# Patient Record
Sex: Male | Born: 1960
Health system: Southern US, Community
[De-identification: ages and names within clinical notes are randomized; demographics above are authoritative.]

## PROBLEM LIST (undated history)

## (undated) DIAGNOSIS — K219 Gastro-esophageal reflux disease without esophagitis: Secondary | ICD-10-CM

## (undated) DIAGNOSIS — Z8619 Personal history of other infectious and parasitic diseases: Secondary | ICD-10-CM

## (undated) DIAGNOSIS — Z860100 Personal history of colon polyps, unspecified: Secondary | ICD-10-CM

## (undated) DIAGNOSIS — E785 Hyperlipidemia, unspecified: Secondary | ICD-10-CM

## (undated) DIAGNOSIS — F329 Major depressive disorder, single episode, unspecified: Secondary | ICD-10-CM

## (undated) DIAGNOSIS — I10 Essential (primary) hypertension: Secondary | ICD-10-CM

## (undated) DIAGNOSIS — Z8489 Family history of other specified conditions: Secondary | ICD-10-CM

## (undated) DIAGNOSIS — F32A Depression, unspecified: Secondary | ICD-10-CM

## (undated) DIAGNOSIS — I209 Angina pectoris, unspecified: Secondary | ICD-10-CM

## (undated) DIAGNOSIS — I2510 Atherosclerotic heart disease of native coronary artery without angina pectoris: Secondary | ICD-10-CM

## (undated) HISTORY — DX: Personal history of colonic polyps: Z86.010

## (undated) HISTORY — PX: REFRACTIVE SURGERY: SHX103

## (undated) HISTORY — DX: Hyperlipidemia, unspecified: E78.5

## (undated) HISTORY — PX: WISDOM TOOTH EXTRACTION: SHX21

## (undated) HISTORY — DX: Personal history of other infectious and parasitic diseases: Z86.19

## (undated) HISTORY — DX: Personal history of colon polyps, unspecified: Z86.0100

## (undated) HISTORY — DX: Essential (primary) hypertension: I10

---

## 2013-09-19 HISTORY — PX: ROTATOR CUFF REPAIR: SHX139

## 2014-09-19 LAB — HM COLONOSCOPY

## 2016-07-12 ENCOUNTER — Encounter: Payer: Self-pay | Admitting: *Deleted

## 2016-07-12 ENCOUNTER — Telehealth: Payer: Self-pay | Admitting: *Deleted

## 2016-07-12 NOTE — Telephone Encounter (Signed)
Pre-Visit Call completed with patient and chart updated.   Pre-Visit Info documented in Specialty Comments under SnapShot.    

## 2016-07-13 ENCOUNTER — Telehealth: Payer: Self-pay | Admitting: Family Medicine

## 2016-07-13 ENCOUNTER — Encounter: Payer: Self-pay | Admitting: Family Medicine

## 2016-07-13 ENCOUNTER — Ambulatory Visit (INDEPENDENT_AMBULATORY_CARE_PROVIDER_SITE_OTHER): Payer: 59 | Admitting: Family Medicine

## 2016-07-13 VITALS — BP 106/52 | HR 74 | Temp 98.4°F | Ht 67.0 in | Wt 184.2 lb

## 2016-07-13 DIAGNOSIS — F32A Depression, unspecified: Secondary | ICD-10-CM | POA: Insufficient documentation

## 2016-07-13 DIAGNOSIS — K219 Gastro-esophageal reflux disease without esophagitis: Secondary | ICD-10-CM

## 2016-07-13 DIAGNOSIS — I10 Essential (primary) hypertension: Secondary | ICD-10-CM | POA: Diagnosis not present

## 2016-07-13 DIAGNOSIS — M79642 Pain in left hand: Secondary | ICD-10-CM | POA: Diagnosis not present

## 2016-07-13 DIAGNOSIS — Z13220 Encounter for screening for lipoid disorders: Secondary | ICD-10-CM | POA: Diagnosis not present

## 2016-07-13 DIAGNOSIS — F324 Major depressive disorder, single episode, in partial remission: Secondary | ICD-10-CM | POA: Diagnosis not present

## 2016-07-13 DIAGNOSIS — R195 Other fecal abnormalities: Secondary | ICD-10-CM

## 2016-07-13 DIAGNOSIS — F329 Major depressive disorder, single episode, unspecified: Secondary | ICD-10-CM | POA: Insufficient documentation

## 2016-07-13 DIAGNOSIS — M79641 Pain in right hand: Secondary | ICD-10-CM | POA: Diagnosis not present

## 2016-07-13 HISTORY — DX: Essential (primary) hypertension: I10

## 2016-07-13 LAB — C-REACTIVE PROTEIN: CRP: 0.2 mg/dL — AB (ref 0.5–20.0)

## 2016-07-13 LAB — COMPREHENSIVE METABOLIC PANEL
ALBUMIN: 4.4 g/dL (ref 3.5–5.2)
ALK PHOS: 65 U/L (ref 39–117)
ALT: 44 U/L (ref 0–53)
AST: 29 U/L (ref 0–37)
BUN: 15 mg/dL (ref 6–23)
CALCIUM: 10.1 mg/dL (ref 8.4–10.5)
CO2: 28 mEq/L (ref 19–32)
CREATININE: 1.03 mg/dL (ref 0.40–1.50)
Chloride: 103 mEq/L (ref 96–112)
GFR: 80.21 mL/min (ref >60.00)
Glucose, Bld: 101 mg/dL — ABNORMAL HIGH (ref 70–99)
POTASSIUM: 4.3 meq/L (ref 3.5–5.1)
SODIUM: 138 meq/L (ref 135–145)
TOTAL PROTEIN: 6.9 g/dL (ref 6.0–8.3)
Total Bilirubin: 0.5 mg/dL (ref 0.2–1.2)

## 2016-07-13 LAB — SEDIMENTATION RATE: SED RATE: 2 mm/h (ref 0–20)

## 2016-07-13 LAB — MICROALBUMIN / CREATININE URINE RATIO
CREATININE, U: 56.2 mg/dL
MICROALB/CREAT RATIO: 0.2 mg/g (ref 0.0–30.0)
Microalb, Ur: 0.1 mg/dL (ref 0.0–1.9)

## 2016-07-13 LAB — LIPID PANEL
CHOLESTEROL: 271 mg/dL — AB (ref 0–200)
HDL: 44.2 mg/dL (ref >39.00)
NonHDL: 226.9
Total CHOL/HDL Ratio: 6
Triglycerides: 352 mg/dL — ABNORMAL HIGH (ref 0.0–149.0)
VLDL: 70.4 mg/dL — AB (ref 0.0–40.0)

## 2016-07-13 LAB — LDL CHOLESTEROL, DIRECT: Direct LDL: 198 mg/dL

## 2016-07-13 MED ORDER — VORTIOXETINE HBR 20 MG PO TABS: 20.0000 mg | ORAL_TABLET | Freq: Every day | ORAL | 1 refills | Status: DC

## 2016-07-13 MED ORDER — LISINOPRIL 20 MG PO TABS: 20.0000 mg | ORAL_TABLET | Freq: Every day | ORAL | 1 refills | Status: DC

## 2016-07-13 NOTE — Progress Notes (Signed)
Chief Complaint  Patient presents with  . Establish Care    pt requesting medication refills,dicuss joint pain in fingers,more heartburn and reflux,freq.diarrhea(chronic)       New Patient Visit SUBJECTIVE: HPI: Jordan Hendricks is an 55 y.o.male who is being seen for establishing care.  The patient was previously seen at [facility]. New to the area.   2011-polyps recheck in 2016 without polyps; to go back in 2021  Joint pain in hands B/l hands, intermittent, 6 mo. Getting more frequent. In knuckles and (points to PIP joints) b/l. Has not had any new numbness, tingling or weakness. No swelling or redness that he notices. No rashes.  GERD Fond of foods that cause him issues- tomato sauces, spicy foods, [place] food. Burning sensation in upper abd region and chest. Tums to calm it down.   Diarrhea 3 times in the AM, will eat and right after will have a BM. Will be nml or loose. Going about 4-5 times daily, has been going on for several years. No incontinence. No pain, weight loss, or bleeding.  Allergies  Allergen Reactions  . Lactose Intolerance (Gi)    Past Medical History:  Diagnosis Date  . Essential hypertension 07/13/2016  . History of chicken pox   . History of colon polyps   . Hyperlipidemia   . Hypertension    Past Surgical History:  Procedure Laterality Date  . REFRACTIVE SURGERY Bilateral   . ROTATOR CUFF REPAIR Right 2015   Social History   Social History  . Marital status: Married   Social History Main Topics  . Smoking status: Never Smoker  . Smokeless tobacco: Never Used  . Alcohol use Yes  . Drug use: No   Family History  Problem Relation Age of Onset  . Heart disease Father   . Prostate cancer Father   . Alzheimer's disease Father   . Diabetes Father   . Hypertension Father   . Diabetes Maternal Grandmother   . Diabetes Paternal Grandmother     Current Outpatient Prescriptions:  .  Cholecalciferol (VITAMIN D3) 2000 units  TABS, Take 1 tablet by mouth daily., Disp: , Rfl:  .  lisinopril (PRINIVIL,ZESTRIL) 20 MG tablet, Take 1 tablet (20 mg total) by mouth daily., Disp: 90 tablet, Rfl: 1 .  Multiple Vitamins-Minerals (MULTIVITAMIN ADULT PO), Take 1 tablet by mouth daily., Disp: , Rfl:  .  vortioxetine HBr (TRINTELLIX) 20 MG TABS, Take 20 mg by mouth daily., Disp: 90 tablet, Rfl: 1  ROS Cardiovascular: Denies chest pain  Respiratory: Denies dyspnea   OBJECTIVE: BP (!) 106/52 (BP Location: Left Arm, Patient Position: Sitting, Cuff Size: Normal)   Pulse 74   Temp 98.4 F (36.9 C) (Oral)   Ht 5' 7"  (1.702 m)   Wt 184 lb 3.2 oz (83.6 kg)   SpO2 97%   BMI 28.85 kg/m    Constitutional: -  VS reviewed -  Well developed, well nourished, appears stated age -  No apparent distress  Psychiatric: -  Oriented to person, place, and time -  Memory intact -  Affect and mood normal -  Fluent conversation, good eye contact -  Judgment and insight age appropriate  Eye: -  Conjunctivae clear, no discharge -  Pupils symmetric, round, reactive to light  ENMT: -  Oral mucosa without lesions, tongue and uvula midline    Tonsils not enlarged, no erythema, no exudate, trachea midline    Pharynx moist, no lesions, no erythema  Neck: -  No gross  swelling, no palpable masses -  Thyroid midline, not enlarged, mobile, no palpable masses  Cardiovascular: -  RRR, no murmurs -  No LE edema  Respiratory: -  Normal respiratory effort, no accessory muscle use, no retraction -  Breath sounds equal, no wheezes, no ronchi, no crackles  Gastrointestinal: -  Bowel sounds normal -  No tenderness, no distention, no guarding, no masses  Musculoskeletal: -  No tenderness to palpation over the MCP or PIP joints bilaterally, there is no swelling or erythema noted either -  Gait normal -  Grip strength is adequate bilaterally   Skin: -  No significant lesion on inspection -  Warm and dry to palpation   ASSESSMENT/PLAN: Pain in both hands  - Plan: Sed Rate (ESR), C-reactive protein, ANA,IFA RA Diag Pnl w/rflx Tit/Patn  Essential hypertension - Plan: Microalbumin / creatinine urine ratio, Comprehensive metabolic panel, lisinopril (PRINIVIL,ZESTRIL) 20 MG tablet  Screening cholesterol level - Plan: Lipid panel  Major depressive disorder with single episode, in partial remission ([place]) - Plan: vortioxetine HBr (TRINTELLIX) 20 MG TABS  Gastroesophageal reflux disease, esophagitis presence not specified  Loose stools  Patient instructed to sign release of records form from his previous PCP. Rule out autoimmune etiology given age and distribution. Discussed avoiding triggers for reflux. Discussed elevating the head of the bed and weight loss are the only evidence based changes that will help symptoms. Everything else is anecdotal. Recommended Prilosec 20 mg twice daily for the next 2 months to see if he gets any relief. As his loose stool issue is a chronic situation and he has had a colonoscopy within the timeframe of loose stools, will recommend bulking agent. If no improvement with this, will refer to gastroenterology. Patient should return in 6 weeks to review the above. The patient voiced understanding and agreement to the plan.   [place], DO 07/13/16  8:40 AM

## 2016-07-13 NOTE — Patient Instructions (Signed)
Metamucil daily will help bulk up your stools.  Prilosec (omeprazole) 20 mg twice daily for the next 2 mo.

## 2016-07-13 NOTE — Progress Notes (Signed)
Pre visit review using our clinic review tool, if applicable. No additional management support is needed unless otherwise documented below in the visit note. 

## 2016-07-13 NOTE — Telephone Encounter (Signed)
[facility] - Dr. Jonathon BellowsIan Foxall

## 2016-07-14 LAB — ANA,IFA RA DIAG PNL W/RFLX TIT/PATN: ANA: NEGATIVE

## 2016-08-24 ENCOUNTER — Encounter: Payer: Self-pay | Admitting: Family Medicine

## 2016-08-24 ENCOUNTER — Ambulatory Visit (INDEPENDENT_AMBULATORY_CARE_PROVIDER_SITE_OTHER): Payer: 59 | Admitting: Family Medicine

## 2016-08-24 VITALS — BP 128/84 | HR 85 | Temp 98.7°F | Ht 67.0 in | Wt 182.0 lb

## 2016-08-24 DIAGNOSIS — R0609 Other forms of dyspnea: Secondary | ICD-10-CM | POA: Diagnosis not present

## 2016-08-24 DIAGNOSIS — R0789 Other chest pain: Secondary | ICD-10-CM

## 2016-08-24 DIAGNOSIS — Z8639 Personal history of other endocrine, nutritional and metabolic disease: Secondary | ICD-10-CM | POA: Diagnosis not present

## 2016-08-24 DIAGNOSIS — K219 Gastro-esophageal reflux disease without esophagitis: Secondary | ICD-10-CM | POA: Diagnosis not present

## 2016-08-24 LAB — COMPREHENSIVE METABOLIC PANEL
ALK PHOS: 64 U/L (ref 39–117)
ALT: 32 U/L (ref 0–53)
AST: 22 U/L (ref 0–37)
Albumin: 4.4 g/dL (ref 3.5–5.2)
BILIRUBIN TOTAL: 0.6 mg/dL (ref 0.2–1.2)
BUN: 15 mg/dL (ref 6–23)
CALCIUM: 9.8 mg/dL (ref 8.4–10.5)
CO2: 27 meq/L (ref 19–32)
Chloride: 103 mEq/L (ref 96–112)
Creatinine, Ser: 1 mg/dL (ref 0.40–1.50)
GFR: 82.96 mL/min (ref >60.00)
GLUCOSE: 109 mg/dL — AB (ref 70–99)
POTASSIUM: 4.1 meq/L (ref 3.5–5.1)
Sodium: 138 mEq/L (ref 135–145)
TOTAL PROTEIN: 7 g/dL (ref 6.0–8.3)

## 2016-08-24 LAB — CBC
HEMATOCRIT: 44.1 % (ref 39.0–52.0)
HEMOGLOBIN: 15.3 g/dL (ref 13.0–17.0)
MCHC: 34.6 g/dL (ref 30.0–36.0)
MCV: 86 fl (ref 78.0–100.0)
PLATELETS: 325 10*3/uL (ref 150.0–400.0)
RBC: 5.13 Mil/uL (ref 4.22–5.81)
RDW: 12.6 % (ref 11.5–15.5)
WBC: 6.1 10*3/uL (ref 4.0–10.5)

## 2016-08-24 LAB — VITAMIN D 25 HYDROXY (VIT D DEFICIENCY, FRACTURES): VITD: 34.3 ng/mL (ref 30.00–100.00)

## 2016-08-24 NOTE — Patient Instructions (Addendum)
Pepcid (famotidine) 20 mg twice daily or Zantac (ranitidine) 150 mg twice daily.

## 2016-08-24 NOTE — Progress Notes (Signed)
Chief Complaint  Patient presents with  . Follow-up    GERD/Pt reports still having reflux     GERD Jordan Hendricks is a 55 y.o. male who presents for evaluation of GERD. He denies nocturnal burning, weight loss Aggravating factors and specific triggers include certain foods. Alleviating factors include proton pump inhibitors and Tums Was recommended to take omeprazole OTC and this did help. He stopped, as he was following the manufacturers instructions.  Chest tightness/SOB Pt has also been noticing DOE, some central chest tightness and mandibular dental pain. This has been going on for around 6 mo. He is largely sedentary, though this was a drastic change. He has a hx of hyperlipidemia and hypertension. He also has a significant family hx of heart disease through his father. He does not smoke. He is compliant with his medications.   Past Medical History:  Diagnosis Date  . Essential hypertension 07/13/2016  . History of chicken pox   . History of colon polyps   . Hyperlipidemia    Medications Current Outpatient Prescriptions on File Prior to Visit  Medication Sig Dispense Refill  . Cholecalciferol (VITAMIN D3) 2000 units TABS Take 1 tablet by mouth daily.    Marland Kitchen. lisinopril (PRINIVIL,ZESTRIL) 20 MG tablet Take 1 tablet (20 mg total) by mouth daily. 90 tablet 1  . Multiple Vitamins-Minerals (MULTIVITAMIN ADULT PO) Take 1 tablet by mouth daily.    Marland Kitchen. vortioxetine HBr (TRINTELLIX) 20 MG TABS Take 20 mg by mouth daily. 90 tablet 1   Allergies Allergies  Allergen Reactions  . Lactose Intolerance (Gi)    Family History Family History  Problem Relation Age of Onset  . Heart disease Father   . Prostate cancer Father   . Alzheimer's disease Father   . Diabetes Father   . Hypertension Father   . Diabetes Maternal Grandmother   . Diabetes Paternal Grandmother     Review of Systems Cardiovascular: no current chest pain Respiratory:  +DOE, no current SOB Gastrointestinal: as  noted in HPI  Exam BP 128/84 (BP Location: Left Arm, Patient Position: Sitting, Cuff Size: Large)   Pulse 85   Temp 98.7 F (37.1 C) (Oral)   Ht 5\' 7"  (1.702 m)   Wt 182 lb (82.6 kg)   SpO2 97%   BMI 28.51 kg/m  General:  well developed, well nourished, in no apparent distress Skin:  warm, no pallor or diaphoresis Thorax:  nontender Lungs:  clear to auscultation, breath sounds equal bilaterally, no respiratory distress, no wheezes Cardio:  regular rate and rhythm without murmurs, heart sounds without clicks or rubs Abdomen:  abdomen soft, nontender; bowel sounds normal; no masses or organomegaly Psych: Well oriented with normal range of affect appropriate judgment/insight  Assessment and Plan  Gastroesophageal reflux disease, esophagitis presence not specified  Dyspnea on exertion - Plan: Ambulatory referral to Cardiology, CBC, Comprehensive metabolic panel  Chest tightness - Plan: Ambulatory referral to Cardiology, EKG 12-Lead  History of vitamin D deficiency - Plan: Vitamin D (25 hydroxy)  Counseled on the diagnosis, course and treatment of the above conditions. EKG obtained today- NSR, does show some T wave changes in V5 and V6 in addition to lead II. I do not appreciate any ST segment elevation or depression. Given hx, patient anxiety, EKG and famhx, will refer to cardiology for possible stress test. I did inform him that the specialist may not feel he needs a stress test and he is OK with that. OK to continue PPI or change to H2 blocker.  OK to stay on it as long as he needs. Counseled on GERD diet and precautions Follow up in 1 month.  Jilda Rocheicholas Paul [place], DO 08/24/16  12:04 PM

## 2016-08-24 NOTE — Progress Notes (Signed)
Pre visit review using our clinic review tool, if applicable. No additional management support is needed unless otherwise documented below in the visit note. 

## 2016-09-30 ENCOUNTER — Encounter (INDEPENDENT_AMBULATORY_CARE_PROVIDER_SITE_OTHER): Payer: Self-pay

## 2016-09-30 ENCOUNTER — Ambulatory Visit (INDEPENDENT_AMBULATORY_CARE_PROVIDER_SITE_OTHER): Payer: 59 | Admitting: Cardiology

## 2016-09-30 ENCOUNTER — Encounter: Payer: Self-pay | Admitting: Cardiology

## 2016-09-30 ENCOUNTER — Encounter: Payer: Self-pay | Admitting: *Deleted

## 2016-09-30 VITALS — BP 136/90 | HR 80 | Ht 67.0 in | Wt 183.0 lb

## 2016-09-30 DIAGNOSIS — I208 Other forms of angina pectoris: Secondary | ICD-10-CM

## 2016-09-30 DIAGNOSIS — E7800 Pure hypercholesterolemia, unspecified: Secondary | ICD-10-CM | POA: Diagnosis not present

## 2016-09-30 DIAGNOSIS — Z8249 Family history of ischemic heart disease and other diseases of the circulatory system: Secondary | ICD-10-CM

## 2016-09-30 DIAGNOSIS — Z01810 Encounter for preprocedural cardiovascular examination: Secondary | ICD-10-CM | POA: Diagnosis not present

## 2016-09-30 MED ORDER — ASPIRIN EC 81 MG PO TBEC: 81.0000 mg | DELAYED_RELEASE_TABLET | Freq: Every day | ORAL | 11 refills | Status: AC

## 2016-09-30 MED ORDER — ROSUVASTATIN CALCIUM 20 MG PO TABS: 20.0000 mg | ORAL_TABLET | Freq: Every day | ORAL | 11 refills | Status: DC

## 2016-09-30 MED ORDER — NITROGLYCERIN 0.4 MG SL SUBL: 0.4000 mg | SUBLINGUAL_TABLET | SUBLINGUAL | 99 refills | Status: DC | PRN

## 2016-09-30 MED ORDER — METOPROLOL SUCCINATE ER 25 MG PO TB24: 25.0000 mg | ORAL_TABLET | Freq: Every day | ORAL | 11 refills | Status: DC

## 2016-09-30 NOTE — Progress Notes (Signed)
 Cardiology Office Note    Date:  09/30/2016   ID:  Jordan Hendricks, DOB 12/30/1960, MRN [id number]  PCP:  Nicholas Paul Wendling, DO  Cardiologist:   Justen Fonda, MD     History of Present Illness:  Jordan Hendricks is a 55 y.o. male here for evaluation of chest pain, dyspnea on exertion at the request of Dr. Wendling.  Review of his previous office note on 08/24/16 he was feeling dyspnea on exertion, central chest tightness, jaw pain. 6 months duration. This seems like a significant change to him. He is nervous about this, moderate in severity. 3 flts of stairs, aches in teeth and jaw. Ran marathon in 2007. Stopped prior Crestor use Bactroban as well. Shoulder 2015 surgery hurt soccer.  He has a history of hypertension, hyperlipidemia and heart disease with his father, age 58 had CABG. Nonsmoker, nondiabetic.  He's also been bothered with GERD. Moved from [place] in July 2017.     Past Medical History:  Diagnosis Date  . Essential hypertension 07/13/2016  . History of chicken pox   . History of colon polyps   . Hyperlipidemia     Past Surgical History:  Procedure Laterality Date  . REFRACTIVE SURGERY Bilateral   . ROTATOR CUFF REPAIR Right 2015    Current Medications: Outpatient Medications Prior to Visit  Medication Sig Dispense Refill  . Cholecalciferol (VITAMIN D3) 2000 units TABS Take 1 tablet by mouth daily.    . lisinopril (PRINIVIL,ZESTRIL) 20 MG tablet Take 1 tablet (20 mg total) by mouth daily. 90 tablet 1  . Multiple Vitamins-Minerals (MULTIVITAMIN ADULT PO) Take 1 tablet by mouth daily.    . vortioxetine HBr (TRINTELLIX) 20 MG TABS Take 20 mg by mouth daily. 90 tablet 1   No facility-administered medications prior to visit.      Allergies:   Lactose intolerance (gi)   Social History   Social History  . Marital status: Married    Spouse name: N/A  . Number of children: N/A  . Years of education: N/A   Social History Main Topics  . Smoking  status: Never Smoker  . Smokeless tobacco: Never Used  . Alcohol use Yes  . Drug use: No  . Sexual activity: Not Asked   Other Topics Concern  . None   Social History Narrative  . None     Family History:  The patient's family history includes Alzheimer's disease in his father and paternal grandmother; Diabetes in his father and maternal grandmother; Heart disease in his father; Hypertension in his father; Prostate cancer in his father.   ROS:   Please see the history of present illness.    ROS All other systems reviewed and are negative.   PHYSICAL EXAM:   VS:  BP 136/90 Comment: left  Pulse 80   Ht 5' 7" (1.702 m)   Wt 183 lb (83 kg)   BMI 28.66 kg/m    GEN: Well nourished, well developed, in no acute distress  HEENT: normal  Neck: no JVD, carotid bruits, or masses Cardiac: RRR; no murmurs, rubs, or gallops,no edema , 2+ radial pulses. Respiratory:  clear to auscultation bilaterally, normal work of breathing GI: soft, nontender, nondistended, + BS MS: no deformity or atrophy  Skin: warm and dry, no rash Neuro:  Alert and Oriented x 3, Strength and sensation are intact Psych: euthymic mood, full affect  Wt Readings from Last 3 Encounters:  09/30/16 183 lb (83 kg)  08/24/16 182 lb (82.6 kg)  07/13/16   184 lb 3.2 oz (83.6 kg)      Studies/Labs Reviewed:   EKG:  08/24/16-sinus rhythm with deeply inverted T waves in the inferior leads as well as lateral leads. Change from prior EKG.  Recent Labs: 08/24/2016: ALT 32; BUN 15; Creatinine, Ser 1.00; Hemoglobin 15.3; Platelets 325.0; Potassium 4.1; Sodium 138   Lipid Panel    Component Value Date/Time   CHOL 271 (H) 07/13/2016 0808   TRIG 352.0 (H) 07/13/2016 0808   HDL 44.20 07/13/2016 0808   CHOLHDL 6 07/13/2016 0808   VLDL 70.4 (H) 07/13/2016 0808   LDLDIRECT 198.0 07/13/2016 0808    Additional studies/ records that were reviewed today include:  Prior office notes, lab work, EKG reviewed.    ASSESSMENT:     1. Angina decubitus (HCC)   2. Family history of early CAD   3. Pure hypercholesterolemia   4. Pre-operative cardiovascular examination      PLAN:  In order of problems listed above:  Angina, escalating  - With T-wave inversions on EKG in the inferior lateral leads, change from prior, chest tightness, dyspnea on exertion and teeth/jaw pain as well as LDL of 198 and father who had bypass surgery in his 50s, we will proceed with cardiac catheterization given his high pretest probability.  - Discussed cardiac catheterization, risks and benefits including stroke, heart attack, death, bleeding. He is willing to proceed. His wife Brenda has had one as well.  - Toprol-XL 25 mg, continue with lisinopril  - Aspirin 81 mg  - Nitroglycerin when necessary  - If symptoms worsen or become more worrisome, he knows to seek medical attention.  Hyperlipidemia  - Starting back Crestor. He took 10 years ago.  Essential hypertension  - Toprol-XL 25., Already on lisinopril 20.  GERD  - . Some of this is may be anginal symptoms. Continuing with ranitidine.   Medication Adjustments/Labs and Tests Ordered: Current medicines are reviewed at length with the patient today.  Concerns regarding medicines are outlined above.  Medication changes, Labs and Tests ordered today are listed in the Patient Instructions below. Patient Instructions  Medication Instructions:  Please start ASA 81 mg a day. Please Metoprolol succinate 25 mg a day. Start Crestor 20 mg a day. You may used SL Ntg as needed for chest pain. Continue all other medications as listed.  Labwork: Please have blood work today (CBC, BMP and PT/INR)  Testing/Procedures: Your physician has requested that you have a cardiac catheterization with Dr Christopher End. Cardiac catheterization is used to diagnose and/or treat various heart conditions. Doctors may recommend this procedure for a number of different reasons. The most common reason is to  evaluate chest pain. Chest pain can be a symptom of coronary artery disease (CAD), and cardiac catheterization can show whether plaque is narrowing or blocking your heart's arteries. This procedure is also used to evaluate the valves, as well as measure the blood flow and oxygen levels in different parts of your heart. For further information please visit www.cardiosmart.org. Please follow instruction sheet, as given.  Follow-Up: Follow up approximately 2 weeks after your heart cath.  If you need a refill on your cardiac medications before your next appointment, please call your pharmacy.  Thank you for choosing [facility]!!    Nitroglycerin sublingual tablets What is this medicine? NITROGLYCERIN (nye troe GLI ser in) is a type of vasodilator. It relaxes blood vessels, increasing the blood and oxygen supply to your heart. This medicine is used to relieve chest pain   caused by angina. It is also used to prevent chest pain before activities like climbing stairs, going outdoors in cold weather, or sexual activity. This medicine may be used for other purposes; ask your health care provider or pharmacist if you have questions. COMMON BRAND NAME(S): Nitroquick, Nitrostat, Nitrotab What should I tell my health care provider before I take this medicine? They need to know if you have any of these conditions: -anemia -head injury, recent stroke, or bleeding in the brain -liver disease -previous heart attack -an unusual or allergic reaction to nitroglycerin, other medicines, foods, dyes, or preservatives -pregnant or trying to get pregnant -breast-feeding How should I use this medicine? Take this medicine by mouth as needed. At the first sign of an angina attack (chest pain or tightness) place one tablet under your tongue. You can also take this medicine 5 to 10 minutes before an event likely to produce chest pain. Follow the directions on the prescription label. Let the tablet dissolve  under the tongue. Do not swallow whole. Replace the dose if you accidentally swallow it. It will help if your mouth is not dry. Saliva around the tablet will help it to dissolve more quickly. Do not eat or drink, smoke or chew tobacco while a tablet is dissolving. If you are not better within 5 minutes after taking ONE dose of nitroglycerin, call 9-1-1 immediately to seek emergency medical care. Do not take more than 3 nitroglycerin tablets over 15 minutes. If you take this medicine often to relieve symptoms of angina, your doctor or health care professional may provide you with different instructions to manage your symptoms. If symptoms do not go away after following these instructions, it is important to call 9-1-1 immediately. Do not take more than 3 nitroglycerin tablets over 15 minutes. Talk to your pediatrician regarding the use of this medicine in children. Special care may be needed. Overdosage: If you think you have taken too much of this medicine contact a poison control center or emergency room at once. NOTE: This medicine is only for you. Do not share this medicine with others. What if I miss a dose? This does not apply. This medicine is only used as needed. What may interact with this medicine? Do not take this medicine with any of the following medications: -certain migraine medicines like ergotamine and dihydroergotamine (DHE) -medicines used to treat erectile dysfunction like sildenafil, tadalafil, and vardenafil -riociguat This medicine may also interact with the following medications: -alteplase -aspirin -heparin -medicines for high blood pressure -medicines for mental depression -other medicines used to treat angina -phenothiazines like chlorpromazine, mesoridazine, prochlorperazine, thioridazine This list may not describe all possible interactions. Give your health care provider a list of all the medicines, herbs, non-prescription drugs, or dietary supplements you use. Also  tell them if you smoke, drink alcohol, or use illegal drugs. Some items may interact with your medicine. What should I watch for while using this medicine? Tell your doctor or health care professional if you feel your medicine is no longer working. Keep this medicine with you at all times. Sit or lie down when you take your medicine to prevent falling if you feel dizzy or faint after using it. Try to remain calm. This will help you to feel better faster. If you feel dizzy, take several deep breaths and lie down with your feet propped up, or bend forward with your head resting between your knees. You may get drowsy or dizzy. Do not drive, use machinery, or do anything that   needs mental alertness until you know how this drug affects you. Do not stand or sit up quickly, especially if you are an older patient. This reduces the risk of dizzy or fainting spells. Alcohol can make you more drowsy and dizzy. Avoid alcoholic drinks. Do not treat yourself for coughs, colds, or pain while you are taking this medicine without asking your doctor or health care professional for advice. Some ingredients may increase your blood pressure. What side effects may I notice from receiving this medicine? Side effects that you should report to your doctor or health care professional as soon as possible: -blurred vision -dry mouth -skin rash -sweating -the feeling of extreme pressure in the head -unusually weak or tired Side effects that usually do not require medical attention (report to your doctor or health care professional if they continue or are bothersome): -flushing of the face or neck -headache -irregular heartbeat, palpitations -nausea, vomiting This list may not describe all possible side effects. Call your doctor for medical advice about side effects. You may report side effects to FDA at 1-800-FDA-1088. Where should I keep my medicine? Keep out of the reach of children. Store at room temperature between 20  and 25 degrees C (68 and 77 degrees F). Store in original container. Protect from light and moisture. Keep tightly closed. Throw away any unused medicine after the expiration date. NOTE: This sheet is a summary. It may not cover all possible information. If you have questions about this medicine, talk to your doctor, pharmacist, or health care provider.  2017 Elsevier/Gold Standard (2013-07-04 17:57:36)     Signed, Doug Bucklin, MD  09/30/2016 10:05 AM    [facility] [address], [place], [place]  [address] Phone: [phone]; Fax: [phone]  

## 2016-09-30 NOTE — Patient Instructions (Signed)
Medication Instructions:  Please start ASA 81 mg a day. Please Metoprolol succinate 25 mg a day. Start Crestor 20 mg a day. You may used SL Ntg as needed for chest pain. Continue all other medications as listed.  Labwork: Please have blood work today (CBC, BMP and PT/INR)  Testing/Procedures: Your physician has requested that you have a cardiac catheterization with Dr Cristal Deer End. Cardiac catheterization is used to diagnose and/or treat various heart conditions. Doctors may recommend this procedure for a number of different reasons. The most common reason is to evaluate chest pain. Chest pain can be a symptom of coronary artery disease (CAD), and cardiac catheterization can show whether plaque is narrowing or blocking your heart's arteries. This procedure is also used to evaluate the valves, as well as measure the blood flow and oxygen levels in different parts of your heart. For further information please visit [email or web address] Please follow instruction sheet, as given.  Follow-Up: Follow up approximately 2 weeks after your heart cath.  If you need a refill on your cardiac medications before your next appointment, please call your pharmacy.  Thank you for choosing [facility]!!    Nitroglycerin sublingual tablets What is this medicine? NITROGLYCERIN (nye troe GLI ser in) is a type of vasodilator. It relaxes blood vessels, increasing the blood and oxygen supply to your heart. This medicine is used to relieve chest pain caused by angina. It is also used to prevent chest pain before activities like climbing stairs, going outdoors in cold weather, or sexual activity. This medicine may be used for other purposes; ask your health care provider or pharmacist if you have questions. COMMON BRAND NAME(S): Nitroquick, Nitrostat, Nitrotab What should I tell my health care provider before I take this medicine? They need to know if you have any of these conditions: -anemia -head  injury, recent stroke, or bleeding in the brain -liver disease -previous heart attack -an unusual or allergic reaction to nitroglycerin, other medicines, foods, dyes, or preservatives -pregnant or trying to get pregnant -breast-feeding How should I use this medicine? Take this medicine by mouth as needed. At the first sign of an angina attack (chest pain or tightness) place one tablet under your tongue. You can also take this medicine 5 to 10 minutes before an event likely to produce chest pain. Follow the directions on the prescription label. Let the tablet dissolve under the tongue. Do not swallow whole. Replace the dose if you accidentally swallow it. It will help if your mouth is not dry. Saliva around the tablet will help it to dissolve more quickly. Do not eat or drink, smoke or chew tobacco while a tablet is dissolving. If you are not better within 5 minutes after taking ONE dose of nitroglycerin, call 9-1-1 immediately to seek emergency medical care. Do not take more than 3 nitroglycerin tablets over 15 minutes. If you take this medicine often to relieve symptoms of angina, your doctor or health care professional may provide you with different instructions to manage your symptoms. If symptoms do not go away after following these instructions, it is important to call 9-1-1 immediately. Do not take more than 3 nitroglycerin tablets over 15 minutes. Talk to your pediatrician regarding the use of this medicine in children. Special care may be needed. Overdosage: If you think you have taken too much of this medicine contact a poison control center or emergency room at once. NOTE: This medicine is only for you. Do not share this medicine with others. What if I  miss a dose? This does not apply. This medicine is only used as needed. What may interact with this medicine? Do not take this medicine with any of the following medications: -certain migraine medicines like ergotamine and dihydroergotamine  (DHE) -medicines used to treat erectile dysfunction like sildenafil, tadalafil, and vardenafil -riociguat This medicine may also interact with the following medications: -alteplase -aspirin -heparin -medicines for high blood pressure -medicines for mental depression -other medicines used to treat angina -phenothiazines like chlorpromazine, mesoridazine, prochlorperazine, thioridazine This list may not describe all possible interactions. Give your health care provider a list of all the medicines, herbs, non-prescription drugs, or dietary supplements you use. Also tell them if you smoke, drink alcohol, or use illegal drugs. Some items may interact with your medicine. What should I watch for while using this medicine? Tell your doctor or health care professional if you feel your medicine is no longer working. Keep this medicine with you at all times. Sit or lie down when you take your medicine to prevent falling if you feel dizzy or faint after using it. Try to remain calm. This will help you to feel better faster. If you feel dizzy, take several deep breaths and lie down with your feet propped up, or bend forward with your head resting between your knees. You may get drowsy or dizzy. Do not drive, use machinery, or do anything that needs mental alertness until you know how this drug affects you. Do not stand or sit up quickly, especially if you are an older patient. This reduces the risk of dizzy or fainting spells. Alcohol can make you more drowsy and dizzy. Avoid alcoholic drinks. Do not treat yourself for coughs, colds, or pain while you are taking this medicine without asking your doctor or health care professional for advice. Some ingredients may increase your blood pressure. What side effects may I notice from receiving this medicine? Side effects that you should report to your doctor or health care professional as soon as possible: -blurred vision -dry mouth -skin rash -sweating -the  feeling of extreme pressure in the head -unusually weak or tired Side effects that usually do not require medical attention (report to your doctor or health care professional if they continue or are bothersome): -flushing of the face or neck -headache -irregular heartbeat, palpitations -nausea, vomiting This list may not describe all possible side effects. Call your doctor for medical advice about side effects. You may report side effects to FDA at 1-800-FDA-1088. Where should I keep my medicine? Keep out of the reach of children. Store at room temperature between 20 and 25 degrees C (68 and 77 degrees F). Store in Retail buyeroriginal container. Protect from light and moisture. Keep tightly closed. Throw away any unused medicine after the expiration date. NOTE: This sheet is a summary. It may not cover all possible information. If you have questions about this medicine, talk to your doctor, pharmacist, or health care provider.  2017 Elsevier/Gold Standard (2013-07-04 17:57:36)

## 2016-10-01 LAB — BASIC METABOLIC PANEL
BUN / CREAT RATIO: 13 (ref 9–20)
BUN: 12 mg/dL (ref 6–24)
CALCIUM: 9.5 mg/dL (ref 8.7–10.2)
CHLORIDE: 103 mmol/L (ref 96–106)
CO2: 23 mmol/L (ref 18–29)
Creatinine, Ser: 0.89 mg/dL (ref 0.76–1.27)
GFR, EST AFRICAN AMERICAN: 113 mL/min/{1.73_m2} (ref >59)
GFR, EST NON AFRICAN AMERICAN: 98 mL/min/{1.73_m2} (ref >59)
Glucose: 86 mg/dL (ref 65–99)
POTASSIUM: 4.7 mmol/L (ref 3.5–5.2)
SODIUM: 142 mmol/L (ref 134–144)

## 2016-10-01 LAB — CBC
HEMOGLOBIN: 15.5 g/dL (ref 13.0–17.7)
Hematocrit: 43.7 % (ref 37.5–51.0)
MCH: 30.4 pg (ref 26.6–33.0)
MCHC: 35.5 g/dL (ref 31.5–35.7)
MCV: 86 fL (ref 79–97)
Platelets: 276 10*3/uL (ref 150–379)
RBC: 5.1 x10E6/uL (ref 4.14–5.80)
RDW: 12.8 % (ref 12.3–15.4)
WBC: 6.1 10*3/uL (ref 3.4–10.8)

## 2016-10-01 LAB — PROTIME-INR
INR: 1 (ref 0.8–1.2)
PROTHROMBIN TIME: 10.4 s (ref 9.1–12.0)

## 2016-10-03 ENCOUNTER — Telehealth: Payer: Self-pay | Admitting: Cardiology

## 2016-10-03 NOTE — Telephone Encounter (Signed)
Spoke with patient about recent lab results 

## 2016-10-03 NOTE — Telephone Encounter (Signed)
New Message  ° ° ° °Returning your call about lab results  °

## 2016-10-07 ENCOUNTER — Encounter (HOSPITAL_COMMUNITY): Payer: Self-pay | Admitting: General Practice

## 2016-10-07 ENCOUNTER — Ambulatory Visit (HOSPITAL_COMMUNITY)
Admission: RE | Admit: 2016-10-07 | Discharge: 2016-10-08 | Disposition: A | Discharge status: Home / Self Care | Payer: 59 | Source: Ambulatory Visit | Attending: Internal Medicine | Admitting: Internal Medicine

## 2016-10-07 ENCOUNTER — Encounter (HOSPITAL_COMMUNITY)
Admission: RE | Disposition: A | Discharge status: Home / Self Care | Payer: Self-pay | Source: Ambulatory Visit | Attending: Internal Medicine

## 2016-10-07 DIAGNOSIS — E739 Lactose intolerance, unspecified: Secondary | ICD-10-CM | POA: Diagnosis not present

## 2016-10-07 DIAGNOSIS — R0609 Other forms of dyspnea: Secondary | ICD-10-CM | POA: Insufficient documentation

## 2016-10-07 DIAGNOSIS — Z833 Family history of diabetes mellitus: Secondary | ICD-10-CM | POA: Insufficient documentation

## 2016-10-07 DIAGNOSIS — Z955 Presence of coronary angioplasty implant and graft: Secondary | ICD-10-CM

## 2016-10-07 DIAGNOSIS — E7800 Pure hypercholesterolemia, unspecified: Secondary | ICD-10-CM | POA: Diagnosis not present

## 2016-10-07 DIAGNOSIS — Z86010 Personal history of colonic polyps: Secondary | ICD-10-CM | POA: Diagnosis not present

## 2016-10-07 DIAGNOSIS — Z8249 Family history of ischemic heart disease and other diseases of the circulatory system: Secondary | ICD-10-CM | POA: Insufficient documentation

## 2016-10-07 DIAGNOSIS — K219 Gastro-esophageal reflux disease without esophagitis: Secondary | ICD-10-CM | POA: Insufficient documentation

## 2016-10-07 DIAGNOSIS — R079 Chest pain, unspecified: Secondary | ICD-10-CM | POA: Diagnosis present

## 2016-10-07 DIAGNOSIS — E785 Hyperlipidemia, unspecified: Secondary | ICD-10-CM | POA: Insufficient documentation

## 2016-10-07 DIAGNOSIS — I10 Essential (primary) hypertension: Secondary | ICD-10-CM | POA: Diagnosis not present

## 2016-10-07 DIAGNOSIS — Z8042 Family history of malignant neoplasm of prostate: Secondary | ICD-10-CM | POA: Insufficient documentation

## 2016-10-07 DIAGNOSIS — I208 Other forms of angina pectoris: Secondary | ICD-10-CM | POA: Diagnosis present

## 2016-10-07 DIAGNOSIS — I25118 Atherosclerotic heart disease of native coronary artery with other forms of angina pectoris: Secondary | ICD-10-CM | POA: Insufficient documentation

## 2016-10-07 DIAGNOSIS — I2089 Other forms of angina pectoris: Secondary | ICD-10-CM | POA: Diagnosis present

## 2016-10-07 HISTORY — PX: PERCUTANEOUS CORONARY STENT INTERVENTION (PCI-S): SHX6016

## 2016-10-07 HISTORY — DX: Depression, unspecified: F32.A

## 2016-10-07 HISTORY — PX: CARDIAC CATHETERIZATION: SHX172

## 2016-10-07 HISTORY — DX: Major depressive disorder, single episode, unspecified: F32.9

## 2016-10-07 HISTORY — DX: Atherosclerotic heart disease of native coronary artery without angina pectoris: I25.10

## 2016-10-07 HISTORY — DX: Family history of other specified conditions: Z84.89

## 2016-10-07 HISTORY — DX: Gastro-esophageal reflux disease without esophagitis: K21.9

## 2016-10-07 HISTORY — DX: Angina pectoris, unspecified: I20.9

## 2016-10-07 LAB — CREATININE, SERUM
Creatinine, Ser: 0.94 mg/dL (ref 0.61–1.24)
GFR calc non Af Amer: >60 mL/min (ref >60)

## 2016-10-07 LAB — CBC
HEMATOCRIT: 41.2 % (ref 39.0–52.0)
Hemoglobin: 14.3 g/dL (ref 13.0–17.0)
MCH: 29.2 pg (ref 26.0–34.0)
MCHC: 34.7 g/dL (ref 30.0–36.0)
MCV: 84.3 fL (ref 78.0–100.0)
Platelets: 233 10*3/uL (ref 150–400)
RBC: 4.89 MIL/uL (ref 4.22–5.81)
RDW: 12.4 % (ref 11.5–15.5)
WBC: 7.1 10*3/uL (ref 4.0–10.5)

## 2016-10-07 LAB — POCT ACTIVATED CLOTTING TIME
ACTIVATED CLOTTING TIME: 235 s
Activated Clotting Time: 367 seconds

## 2016-10-07 SURGERY — LEFT HEART CATH AND CORONARY ANGIOGRAPHY

## 2016-10-07 MED ORDER — IOPAMIDOL (ISOVUE-370) INJECTION 76%
INTRAVENOUS | Status: DC | PRN
  Administered 2016-10-07: 160 mL via INTRA_ARTERIAL

## 2016-10-07 MED ORDER — HEPARIN SODIUM (PORCINE) 1000 UNIT/ML IJ SOLN
INTRAMUSCULAR | Status: DC | PRN
  Administered 2016-10-07 (×2): 4000 [IU] via INTRAVENOUS
  Administered 2016-10-07: 3000 [IU] via INTRAVENOUS

## 2016-10-07 MED ORDER — IOPAMIDOL (ISOVUE-370) INJECTION 76%
INTRAVENOUS | Status: AC
  Filled 2016-10-07: qty 100

## 2016-10-07 MED ORDER — PRASUGREL HCL 10 MG PO TABS
ORAL_TABLET | ORAL | Status: AC
  Filled 2016-10-07: qty 1

## 2016-10-07 MED ORDER — HEPARIN SODIUM (PORCINE) 1000 UNIT/ML IJ SOLN
INTRAMUSCULAR | Status: AC
  Filled 2016-10-07: qty 1

## 2016-10-07 MED ORDER — ROSUVASTATIN CALCIUM 20 MG PO TABS
20.0000 mg | ORAL_TABLET | Freq: Every day | ORAL | Status: DC
  Administered 2016-10-07: 20 mg via ORAL
  Filled 2016-10-07 (×2): qty 1

## 2016-10-07 MED ORDER — NITROGLYCERIN 1 MG/10 ML FOR IR/CATH LAB
INTRA_ARTERIAL | Status: AC
  Filled 2016-10-07: qty 10

## 2016-10-07 MED ORDER — FAMOTIDINE 20 MG PO TABS
20.0000 mg | ORAL_TABLET | Freq: Two times a day (BID) | ORAL | Status: DC
  Administered 2016-10-07: 23:00:00 20 mg via ORAL
  Filled 2016-10-07 (×2): qty 1

## 2016-10-07 MED ORDER — ANGIOPLASTY BOOK
Freq: Once | Status: AC
  Administered 2016-10-07: 23:00:00
  Filled 2016-10-07: qty 1

## 2016-10-07 MED ORDER — ACETAMINOPHEN 325 MG PO TABS
650.0000 mg | ORAL_TABLET | ORAL | Status: DC | PRN
  Administered 2016-10-07 (×2): 650 mg via ORAL
  Filled 2016-10-07: qty 2

## 2016-10-07 MED ORDER — PRASUGREL HCL 10 MG PO TABS
ORAL_TABLET | ORAL | Status: DC | PRN
  Administered 2016-10-07: 60 mg via ORAL

## 2016-10-07 MED ORDER — SODIUM CHLORIDE 0.9 % WEIGHT BASED INFUSION: 1.0000 mL/kg/h | INTRAVENOUS | Status: DC

## 2016-10-07 MED ORDER — SODIUM CHLORIDE 0.9 % IV SOLN
250.0000 mL | INTRAVENOUS | Status: DC | PRN
  Administered 2016-10-07: 250 mL via INTRAVENOUS

## 2016-10-07 MED ORDER — SODIUM CHLORIDE 0.9 % IV SOLN
INTRAVENOUS | Status: AC
  Administered 2016-10-07: 13:00:00 via INTRAVENOUS

## 2016-10-07 MED ORDER — SODIUM CHLORIDE 0.9% FLUSH: 3.0000 mL | Freq: Two times a day (BID) | INTRAVENOUS | Status: DC

## 2016-10-07 MED ORDER — SODIUM CHLORIDE 0.9% FLUSH
3.0000 mL | Freq: Two times a day (BID) | INTRAVENOUS | Status: DC
  Administered 2016-10-07 (×2): 3 mL via INTRAVENOUS

## 2016-10-07 MED ORDER — METOPROLOL SUCCINATE ER 25 MG PO TB24
25.0000 mg | ORAL_TABLET | Freq: Every day | ORAL | Status: DC
  Administered 2016-10-07: 25 mg via ORAL
  Filled 2016-10-07 (×2): qty 1

## 2016-10-07 MED ORDER — SODIUM CHLORIDE 0.9 % IV SOLN: 250.0000 mL | INTRAVENOUS | Status: DC | PRN

## 2016-10-07 MED ORDER — ASPIRIN 81 MG PO CHEW
81.0000 mg | CHEWABLE_TABLET | Freq: Every day | ORAL | Status: DC
  Filled 2016-10-07: qty 1

## 2016-10-07 MED ORDER — VERAPAMIL HCL 2.5 MG/ML IV SOLN
INTRAVENOUS | Status: AC
Start: 2016-10-07 — End: 2016-10-07
  Filled 2016-10-07: qty 2

## 2016-10-07 MED ORDER — FENTANYL CITRATE (PF) 100 MCG/2ML IJ SOLN
INTRAMUSCULAR | Status: DC | PRN
  Administered 2016-10-07: 50 ug via INTRAVENOUS

## 2016-10-07 MED ORDER — NITROGLYCERIN 1 MG/10 ML FOR IR/CATH LAB
INTRA_ARTERIAL | Status: DC | PRN
  Administered 2016-10-07 (×4): 100 ug via INTRACORONARY

## 2016-10-07 MED ORDER — HEPARIN (PORCINE) IN NACL 2-0.9 UNIT/ML-% IJ SOLN
INTRAMUSCULAR | Status: AC
  Filled 2016-10-07: qty 500

## 2016-10-07 MED ORDER — FENTANYL CITRATE (PF) 100 MCG/2ML IJ SOLN
INTRAMUSCULAR | Status: AC
  Filled 2016-10-07: qty 2

## 2016-10-07 MED ORDER — HEPARIN (PORCINE) IN NACL 2-0.9 UNIT/ML-% IJ SOLN
INTRAMUSCULAR | Status: DC | PRN
  Administered 2016-10-07: 1000 mL

## 2016-10-07 MED ORDER — SODIUM CHLORIDE 0.9 % WEIGHT BASED INFUSION
3.0000 mL/kg/h | INTRAVENOUS | Status: DC
  Administered 2016-10-07: 3 mL/kg/h via INTRAVENOUS

## 2016-10-07 MED ORDER — LIDOCAINE HCL (PF) 1 % IJ SOLN
INTRAMUSCULAR | Status: AC
Start: 2016-10-07 — End: 2016-10-07
  Filled 2016-10-07: qty 30

## 2016-10-07 MED ORDER — ACETAMINOPHEN 325 MG PO TABS
ORAL_TABLET | ORAL | Status: AC
  Filled 2016-10-07: qty 2

## 2016-10-07 MED ORDER — MIDAZOLAM HCL 2 MG/2ML IJ SOLN
INTRAMUSCULAR | Status: DC | PRN
  Administered 2016-10-07: 1 mg via INTRAVENOUS

## 2016-10-07 MED ORDER — VORTIOXETINE HBR 20 MG PO TABS
20.0000 mg | ORAL_TABLET | Freq: Every day | ORAL | Status: DC
  Administered 2016-10-07: 14:00:00 20 mg via ORAL
  Filled 2016-10-07 (×2): qty 20

## 2016-10-07 MED ORDER — HYDRALAZINE HCL 20 MG/ML IJ SOLN: 5.0000 mg | INTRAMUSCULAR | Status: AC | PRN

## 2016-10-07 MED ORDER — LABETALOL HCL 5 MG/ML IV SOLN: 10.0000 mg | INTRAVENOUS | Status: AC | PRN

## 2016-10-07 MED ORDER — ONDANSETRON HCL 4 MG/2ML IJ SOLN: 4.0000 mg | Freq: Four times a day (QID) | INTRAMUSCULAR | Status: DC | PRN

## 2016-10-07 MED ORDER — MIDAZOLAM HCL 2 MG/2ML IJ SOLN
INTRAMUSCULAR | Status: AC
  Filled 2016-10-07: qty 2

## 2016-10-07 MED ORDER — NITROGLYCERIN 0.4 MG SL SUBL: 0.4000 mg | SUBLINGUAL_TABLET | SUBLINGUAL | Status: DC | PRN

## 2016-10-07 MED ORDER — PRASUGREL HCL 10 MG PO TABS
ORAL_TABLET | ORAL | Status: AC
  Filled 2016-10-07: qty 5

## 2016-10-07 MED ORDER — SODIUM CHLORIDE 0.9% FLUSH: 3.0000 mL | INTRAVENOUS | Status: DC | PRN

## 2016-10-07 MED ORDER — VERAPAMIL HCL 2.5 MG/ML IV SOLN
INTRAVENOUS | Status: DC | PRN
  Administered 2016-10-07: 10:00:00 via INTRA_ARTERIAL

## 2016-10-07 MED ORDER — ASPIRIN 81 MG PO CHEW
CHEWABLE_TABLET | ORAL | Status: AC
  Filled 2016-10-07: qty 1

## 2016-10-07 MED ORDER — ASPIRIN 81 MG PO CHEW
81.0000 mg | CHEWABLE_TABLET | ORAL | Status: AC
  Administered 2016-10-07: 81 mg via ORAL

## 2016-10-07 MED ORDER — ENOXAPARIN SODIUM 40 MG/0.4ML ~~LOC~~ SOLN: 40.0000 mg | SUBCUTANEOUS | Status: DC

## 2016-10-07 MED ORDER — CLOPIDOGREL BISULFATE 75 MG PO TABS
75.0000 mg | ORAL_TABLET | Freq: Every day | ORAL | Status: DC
  Administered 2016-10-08: 09:00:00 75 mg via ORAL
  Filled 2016-10-07: qty 1

## 2016-10-07 MED ORDER — ISOSORBIDE MONONITRATE ER 30 MG PO TB24
15.0000 mg | ORAL_TABLET | Freq: Every day | ORAL | Status: DC
  Administered 2016-10-07 – 2016-10-08 (×2): 15 mg via ORAL
  Filled 2016-10-07 (×2): qty 1

## 2016-10-07 MED ORDER — LIDOCAINE HCL (PF) 1 % IJ SOLN
INTRAMUSCULAR | Status: DC | PRN
  Administered 2016-10-07: 2 mL

## 2016-10-07 SURGICAL SUPPLY — 18 items
BALLN EUPHORA RX 2.5X12 (BALLOONS) ×3
BALLN ~~LOC~~ EUPHORA RX 3.5X12 (BALLOONS) ×3
BALLOON EUPHORA RX 2.5X12 (BALLOONS) ×1 IMPLANT
BALLOON ~~LOC~~ EUPHORA RX 3.5X12 (BALLOONS) ×1 IMPLANT
CATH 5FR JL3.5 JR4 ANG PIG MP (CATHETERS) ×3 IMPLANT
DEVICE RAD COMP TR BAND LRG (VASCULAR PRODUCTS) ×3 IMPLANT
GLIDESHEATH SLEND SS 6F .021 (SHEATH) ×3 IMPLANT
GUIDE CATH RUNWAY 6FR FR4 (CATHETERS) ×3 IMPLANT
GUIDEWIRE INQWIRE 1.5J.035X260 (WIRE) ×1 IMPLANT
INQWIRE 1.5J .035X260CM (WIRE) ×3
KIT ENCORE 26 ADVANTAGE (KITS) ×3 IMPLANT
KIT HEART LEFT (KITS) ×3 IMPLANT
PACK CARDIAC CATHETERIZATION (CUSTOM PROCEDURE TRAY) ×3 IMPLANT
STENT PROMUS PREM MR 3.0X16 (Permanent Stent) ×3 IMPLANT
SYR MEDRAD MARK V 150ML (SYRINGE) ×3 IMPLANT
TRANSDUCER W/STOPCOCK (MISCELLANEOUS) ×3 IMPLANT
TUBING CIL FLEX 10 FLL-RA (TUBING) ×3 IMPLANT
WIRE ASAHI PROWATER 180CM (WIRE) ×3 IMPLANT

## 2016-10-07 NOTE — H&P (View-Only) (Signed)
Cardiology Office Note    Date:  09/30/2016   ID:  Sanda Klein, DOB [birth year], MRN [id number]  PCP:  Sharlene Dory, DO  Cardiologist:   Donato Schultz, MD     History of Present Illness:  Jordan Hendricks is a 56 y.o. male here for evaluation of chest pain, dyspnea on exertion at the request of Dr. Carmelia Roller.  Review of his previous office note on 08/24/16 he was feeling dyspnea on exertion, central chest tightness, jaw pain. 6 months duration. This seems like a significant change to him. He is nervous about this, moderate in severity. 3 flts of stairs, aches in teeth and jaw. Ran marathon in 2007. Stopped prior Crestor use Bactroban as well. Shoulder 2015 surgery hurt soccer.  He has a history of hypertension, hyperlipidemia and heart disease with his father, age 22 had CABG. Nonsmoker, nondiabetic.  He's also been bothered with GERD. Moved from [place] in July 2017.     Past Medical History:  Diagnosis Date  . Essential hypertension 07/13/2016  . History of chicken pox   . History of colon polyps   . Hyperlipidemia     Past Surgical History:  Procedure Laterality Date  . REFRACTIVE SURGERY Bilateral   . ROTATOR CUFF REPAIR Right 2015    Current Medications: Outpatient Medications Prior to Visit  Medication Sig Dispense Refill  . Cholecalciferol (VITAMIN D3) 2000 units TABS Take 1 tablet by mouth daily.    Marland Kitchen lisinopril (PRINIVIL,ZESTRIL) 20 MG tablet Take 1 tablet (20 mg total) by mouth daily. 90 tablet 1  . Multiple Vitamins-Minerals (MULTIVITAMIN ADULT PO) Take 1 tablet by mouth daily.    Marland Kitchen vortioxetine HBr (TRINTELLIX) 20 MG TABS Take 20 mg by mouth daily. 90 tablet 1   No facility-administered medications prior to visit.      Allergies:   Lactose intolerance (gi)   Social History   Social History  . Marital status: Married    Spouse name: N/A  . Number of children: N/A  . Years of education: N/A   Social History Main Topics  . Smoking  status: Never Smoker  . Smokeless tobacco: Never Used  . Alcohol use Yes  . Drug use: No  . Sexual activity: Not Asked   Other Topics Concern  . None   Social History Narrative  . None     Family History:  The patient's family history includes Alzheimer's disease in his father and paternal grandmother; Diabetes in his father and maternal grandmother; Heart disease in his father; Hypertension in his father; Prostate cancer in his father.   ROS:   Please see the history of present illness.    ROS All other systems reviewed and are negative.   PHYSICAL EXAM:   VS:  BP 136/90 Comment: left  Pulse 80   Ht 5\' 7"  (1.702 m)   Wt 183 lb (83 kg)   BMI 28.66 kg/m    GEN: Well nourished, well developed, in no acute distress  HEENT: normal  Neck: no JVD, carotid bruits, or masses Cardiac: RRR; no murmurs, rubs, or gallops,no edema , 2+ radial pulses. Respiratory:  clear to auscultation bilaterally, normal work of breathing GI: soft, nontender, nondistended, + BS MS: no deformity or atrophy  Skin: warm and dry, no rash Neuro:  Alert and Oriented x 3, Strength and sensation are intact Psych: euthymic mood, full affect  Wt Readings from Last 3 Encounters:  09/30/16 183 lb (83 kg)  08/24/16 182 lb (82.6 kg)  07/13/16  184 lb 3.2 oz (83.6 kg)      Studies/Labs Reviewed:   EKG:  08/24/16-sinus rhythm with deeply inverted T waves in the inferior leads as well as lateral leads. Change from prior EKG.  Recent Labs: 08/24/2016: ALT 32; BUN 15; Creatinine, Ser 1.00; Hemoglobin 15.3; Platelets 325.0; Potassium 4.1; Sodium 138   Lipid Panel    Component Value Date/Time   CHOL 271 (H) 07/13/2016 0808   TRIG 352.0 (H) 07/13/2016 0808   HDL 44.20 07/13/2016 0808   CHOLHDL 6 07/13/2016 0808   VLDL 70.4 (H) 07/13/2016 0808   LDLDIRECT 198.0 07/13/2016 0808    Additional studies/ records that were reviewed today include:  Prior office notes, lab work, EKG reviewed.    ASSESSMENT:     1. Angina decubitus (HCC)   2. Family history of early CAD   3. Pure hypercholesterolemia   4. Pre-operative cardiovascular examination      PLAN:  In order of problems listed above:  Angina, escalating  - With T-wave inversions on EKG in the inferior lateral leads, change from prior, chest tightness, dyspnea on exertion and teeth/jaw pain as well as LDL of 198 and father who had bypass surgery in his 3350s, we will proceed with cardiac catheterization given his high pretest probability.  - Discussed cardiac catheterization, risks and benefits including stroke, heart attack, death, bleeding. He is willing to proceed. His wife [name] has had one as well.  - Toprol-XL 25 mg, continue with lisinopril  - Aspirin 81 mg  - Nitroglycerin when necessary  - If symptoms worsen or become more worrisome, he knows to seek medical attention.  Hyperlipidemia  - Starting back Crestor. He took 10 years ago.  Essential hypertension  - Toprol-XL 25., Already on lisinopril 20.  GERD  - . Some of this is may be anginal symptoms. Continuing with ranitidine.   Medication Adjustments/Labs and Tests Ordered: Current medicines are reviewed at length with the patient today.  Concerns regarding medicines are outlined above.  Medication changes, Labs and Tests ordered today are listed in the Patient Instructions below. Patient Instructions  Medication Instructions:  Please start ASA 81 mg a day. Please Metoprolol succinate 25 mg a day. Start Crestor 20 mg a day. You may used SL Ntg as needed for chest pain. Continue all other medications as listed.  Labwork: Please have blood work today (CBC, BMP and PT/INR)  Testing/Procedures: Your physician has requested that you have a cardiac catheterization with Dr Cristal Deerhristopher End. Cardiac catheterization is used to diagnose and/or treat various heart conditions. Doctors may recommend this procedure for a number of different reasons. The most common reason is to  evaluate chest pain. Chest pain can be a symptom of coronary artery disease (CAD), and cardiac catheterization can show whether plaque is narrowing or blocking your heart's arteries. This procedure is also used to evaluate the valves, as well as measure the blood flow and oxygen levels in different parts of your heart. For further information please visit [email or web address] Please follow instruction sheet, as given.  Follow-Up: Follow up approximately 2 weeks after your heart cath.  If you need a refill on your cardiac medications before your next appointment, please call your pharmacy.  Thank you for choosing [facility]!!    Nitroglycerin sublingual tablets What is this medicine? NITROGLYCERIN (nye troe GLI ser in) is a type of vasodilator. It relaxes blood vessels, increasing the blood and oxygen supply to your heart. This medicine is used to relieve chest pain  caused by angina. It is also used to prevent chest pain before activities like climbing stairs, going outdoors in cold weather, or sexual activity. This medicine may be used for other purposes; ask your health care provider or pharmacist if you have questions. COMMON BRAND NAME(S): Nitroquick, Nitrostat, Nitrotab What should I tell my health care provider before I take this medicine? They need to know if you have any of these conditions: -anemia -head injury, recent stroke, or bleeding in the brain -liver disease -previous heart attack -an unusual or allergic reaction to nitroglycerin, other medicines, foods, dyes, or preservatives -pregnant or trying to get pregnant -breast-feeding How should I use this medicine? Take this medicine by mouth as needed. At the first sign of an angina attack (chest pain or tightness) place one tablet under your tongue. You can also take this medicine 5 to 10 minutes before an event likely to produce chest pain. Follow the directions on the prescription label. Let the tablet dissolve  under the tongue. Do not swallow whole. Replace the dose if you accidentally swallow it. It will help if your mouth is not dry. Saliva around the tablet will help it to dissolve more quickly. Do not eat or drink, smoke or chew tobacco while a tablet is dissolving. If you are not better within 5 minutes after taking ONE dose of nitroglycerin, call 9-1-1 immediately to seek emergency medical care. Do not take more than 3 nitroglycerin tablets over 15 minutes. If you take this medicine often to relieve symptoms of angina, your doctor or health care professional may provide you with different instructions to manage your symptoms. If symptoms do not go away after following these instructions, it is important to call 9-1-1 immediately. Do not take more than 3 nitroglycerin tablets over 15 minutes. Talk to your pediatrician regarding the use of this medicine in children. Special care may be needed. Overdosage: If you think you have taken too much of this medicine contact a poison control center or emergency room at once. NOTE: This medicine is only for you. Do not share this medicine with others. What if I miss a dose? This does not apply. This medicine is only used as needed. What may interact with this medicine? Do not take this medicine with any of the following medications: -certain migraine medicines like ergotamine and dihydroergotamine (DHE) -medicines used to treat erectile dysfunction like sildenafil, tadalafil, and vardenafil -riociguat This medicine may also interact with the following medications: -alteplase -aspirin -heparin -medicines for high blood pressure -medicines for mental depression -other medicines used to treat angina -phenothiazines like chlorpromazine, mesoridazine, prochlorperazine, thioridazine This list may not describe all possible interactions. Give your health care provider a list of all the medicines, herbs, non-prescription drugs, or dietary supplements you use. Also  tell them if you smoke, drink alcohol, or use illegal drugs. Some items may interact with your medicine. What should I watch for while using this medicine? Tell your doctor or health care professional if you feel your medicine is no longer working. Keep this medicine with you at all times. Sit or lie down when you take your medicine to prevent falling if you feel dizzy or faint after using it. Try to remain calm. This will help you to feel better faster. If you feel dizzy, take several deep breaths and lie down with your feet propped up, or bend forward with your head resting between your knees. You may get drowsy or dizzy. Do not drive, use machinery, or do anything that  needs mental alertness until you know how this drug affects you. Do not stand or sit up quickly, especially if you are an older patient. This reduces the risk of dizzy or fainting spells. Alcohol can make you more drowsy and dizzy. Avoid alcoholic drinks. Do not treat yourself for coughs, colds, or pain while you are taking this medicine without asking your doctor or health care professional for advice. Some ingredients may increase your blood pressure. What side effects may I notice from receiving this medicine? Side effects that you should report to your doctor or health care professional as soon as possible: -blurred vision -dry mouth -skin rash -sweating -the feeling of extreme pressure in the head -unusually weak or tired Side effects that usually do not require medical attention (report to your doctor or health care professional if they continue or are bothersome): -flushing of the face or neck -headache -irregular heartbeat, palpitations -nausea, vomiting This list may not describe all possible side effects. Call your doctor for medical advice about side effects. You may report side effects to FDA at 1-800-FDA-1088. Where should I keep my medicine? Keep out of the reach of children. Store at room temperature between 20  and 25 degrees C (68 and 77 degrees F). Store in Retail buyer. Protect from light and moisture. Keep tightly closed. Throw away any unused medicine after the expiration date. NOTE: This sheet is a summary. It may not cover all possible information. If you have questions about this medicine, talk to your doctor, pharmacist, or health care provider.  2017 Elsevier/Gold Standard (2013-07-04 17:57:36)     Signed, Donato Schultz, MD  09/30/2016 10:05 AM    [facility] [address] [place], [place], [place]  [address] Phone: [phone]; Fax: [phone]

## 2016-10-07 NOTE — Progress Notes (Signed)
TR BAND REMOVAL  LOCATION:    right radial  DEFLATED PER PROTOCOL:    Yes.    TIME BAND OFF / DRESSING APPLIED:    1445   SITE UPON ARRIVAL:    Level 1 ( small bruise)  SITE AFTER BAND REMOVAL:    Level 1  CIRCULATION SENSATION AND MOVEMENT:    Within Normal Limits   Yes.    COMMENTS:   Tolerated procedure well

## 2016-10-07 NOTE — Interval H&P Note (Signed)
History and Physical Interval Note:  10/07/2016 9:21 AM  Jordan Hendricks  has presented today for cardiac catheterization, with the diagnosis of chest pain. The various methods of treatment have been discussed with the patient and family. After consideration of risks, benefits and other options for treatment, the patient has consented to  Procedure(s): Left Heart Cath and Coronary Angiography (N/A) as a surgical intervention .  The patient's history has been reviewed, patient examined, no change in status, stable for surgery.  I have reviewed the patient's chart and labs.  Questions were answered to the patient's satisfaction.    Cath Lab Visit (complete for each Cath Lab visit)  Clinical Evaluation Leading to the Procedure:   ACS: No.  Non-ACS:    Anginal Classification: CCS III  Anti-ischemic medical therapy: Minimal Therapy (1 class of medications)  Non-Invasive Test Results: No non-invasive testing performed  Prior CABG: No previous CABG        Jordan Hendricks

## 2016-10-07 NOTE — Brief Op Note (Signed)
Brief Cardiac Catheterization Note (Full Report to Follow)  Date: 10/07/2016 Time: 10:46 AM  PATIENT:  Jordan Hendricks  56 y.o. male  PRE-OPERATIVE DIAGNOSIS:  Chest pain  POST-OPERATIVE DIAGNOSIS:  Coronary artery disease  PROCEDURE:  Procedure(s) with comments: Left Heart Cath and Coronary Angiography (N/A) Coronary Stent Intervention (N/A) - RCA  SURGEON:  Surgeon(s) and Role:    * Jadarius Commons, MD - Primary  FINDINGS: 1.  95% proximal RCA stenosis with competitive filling of the rPL branches via the LCx. 2.  Mild to moderate, non-obstructive CAD involving the left coronary artery. 3.  Catheter pressure dampening in the LMCA and ostial RCA that improved after intracoronary nitroglycerin suggestive of vasospasm. 4.  Successful PCI to proximal RCA with placement of Promus Premier 3.0 x 16 mm drug-eluting stent post-dilated with 3.5 mm [place] balloon.  RECOMMENDATIONS: 1. Admit for overnight observation following PCI given significant vasospasm. 2. Patient loaded with prasugrel in the cath lab and will continue clopidogrel 75 mg daily for at least 6 months, ideally longer. 3. Aggressive secondary prevention and medical therapy for component of vasospasm.  Yvonne Kendallhristopher Andray Assefa, MD [facility] Pager: [phone]

## 2016-10-07 NOTE — Care Management Note (Addendum)
Case Management Note  Patient Details  Name: Sanda KleinDean Koelzer MRN: [id number] Date of Birth: 1961-02-07  Subjective/Objective:       S/p coronary stent intervention, will be on plavix, lives with wife, has pcp, kDr. Arva [place]afeicholas [place], pta indep, no problem getting meds, and has transportation at dc. NCM will cont to follow for dc needs.             Action/Plan:   Expected Discharge Date:                  Expected Discharge Plan:  Home/Self Care  In-House Referral:     Discharge planning Services  CM Consult  Post Acute Care Choice:    Choice offered to:     DME Arranged:    DME Agency:     HH Arranged:    HH Agency:     Status of Service:  In process, will continue to follow  If discussed at Long Length of Stay Meetings, dates discussed:    Additional Comments:  Leone Havenaylor, Gresia Isidoro Clinton, RN 10/07/2016, 2:04 PM

## 2016-10-08 DIAGNOSIS — I208 Other forms of angina pectoris: Secondary | ICD-10-CM | POA: Diagnosis not present

## 2016-10-08 DIAGNOSIS — E785 Hyperlipidemia, unspecified: Secondary | ICD-10-CM | POA: Diagnosis not present

## 2016-10-08 DIAGNOSIS — E739 Lactose intolerance, unspecified: Secondary | ICD-10-CM | POA: Diagnosis not present

## 2016-10-08 DIAGNOSIS — I25118 Atherosclerotic heart disease of native coronary artery with other forms of angina pectoris: Secondary | ICD-10-CM | POA: Diagnosis not present

## 2016-10-08 DIAGNOSIS — I10 Essential (primary) hypertension: Secondary | ICD-10-CM | POA: Diagnosis not present

## 2016-10-08 LAB — CBC
HEMATOCRIT: 41.9 % (ref 39.0–52.0)
Hemoglobin: 14.2 g/dL (ref 13.0–17.0)
MCH: 29 pg (ref 26.0–34.0)
MCHC: 33.9 g/dL (ref 30.0–36.0)
MCV: 85.7 fL (ref 78.0–100.0)
PLATELETS: 226 10*3/uL (ref 150–400)
RBC: 4.89 MIL/uL (ref 4.22–5.81)
RDW: 12.5 % (ref 11.5–15.5)
WBC: 7 10*3/uL (ref 4.0–10.5)

## 2016-10-08 LAB — BASIC METABOLIC PANEL
Anion gap: 6 (ref 5–15)
BUN: 10 mg/dL (ref 6–20)
CHLORIDE: 108 mmol/L (ref 101–111)
CO2: 27 mmol/L (ref 22–32)
Calcium: 9.2 mg/dL (ref 8.9–10.3)
Creatinine, Ser: 1.09 mg/dL (ref 0.61–1.24)
Glucose, Bld: 103 mg/dL — ABNORMAL HIGH (ref 65–99)
POTASSIUM: 4.7 mmol/L (ref 3.5–5.1)
Sodium: 141 mmol/L (ref 135–145)

## 2016-10-08 MED ORDER — ISOSORBIDE MONONITRATE ER 30 MG PO TB24: 15.0000 mg | ORAL_TABLET | Freq: Every day | ORAL | 6 refills | Status: DC

## 2016-10-08 MED ORDER — CLOPIDOGREL BISULFATE 75 MG PO TABS: 75.0000 mg | ORAL_TABLET | Freq: Every day | ORAL | 3 refills | Status: DC

## 2016-10-08 NOTE — Discharge Summary (Signed)
Discharge Summary    Patient ID: Jordan Hendricks,  MRN: [id number], DOB/AGE: [birth year] 54 y.o.  Admit date: 10/07/2016 Discharge date: 10/08/2016  Primary Care Provider: Sharlene Dory Primary Cardiologist: [facility]  Discharge Diagnoses    Principal Problem:   Chest pain Active Problems:   Stable angina (HCC)   HLd   HTN  Allergies Allergies  Allergen Reactions  . Lactose Intolerance (Gi)     Diagnostic Studies/Procedures    Coronary Stent Intervention  Left Heart Cath and Coronary Angiography  Conclusion   FINDINGS: 1. 95% proximal RCA stenosis with competitive filling of the rPL branches via the LCx. 2. Mild to moderate, non-obstructive CAD involving the left coronary artery. 3. Pressure dampening in the LMCA and ostial RCA with catheter engagement that improved after intracoronary nitroglycerin consistent with vasospasm. 4. Normal left ventricular filling pressure. 5. Normal left ventricular contraction. 6. Successful PCI to proximal RCA with placement of Promus Premier 3.0 x 16 mm drug-eluting stent post-dilated with 3.5 mm [place] balloon.  RECOMMENDATIONS: 1. Admit for overnight observation following PCI given significant vasospasm. 2. Patient loaded with prasugrel in the cath lab and will continue clopidogrel 75 mg daily for at least 6 months, ideally longer. 3. Aggressive secondary prevention and medical therapy for component of vasospasm.     History of Present Illness     56 y.o. male with hx of HTN, HLD and GERD who seen by clinic for chest pain and dyspnea on exertion x 6 months presented for scheduled cath.   feeling dyspnea on exertion, central chest tightness, jaw pain. 6 months duration. This seems like a significant change to him. He is nervous about this, moderate in severity. 3 flts of stairs, aches in teeth and jaw. Ran marathon in 2007. Stopped prior Crestor use Bactroban as well. Shoulder 2015 surgery hurt soccer.  He has a history  of hypertension, hyperlipidemia and heart disease with his father, age 83 had CABG. Nonsmoker, nondiabetic.  With T-wave inversions on EKG in the inferior lateral leads, change from prior, chest tightness, dyspnea on exertion and teeth/jaw pain as well as LDL of 198 and father who had bypass surgery in his 62s, plan made to proceed with cardiac catheterization given his high pretest probability.  Hospital Course     Consultants: None  1. Angina, escalating - s/p Successful PTCA and DES to proximal RCA. rPL branches filling via the LCx. Mild to moderate disease elsewhere. Significant vasospasm during cath. Continue ASA, Plavix, Imdur, BB and statin. Ambulate well today with CRP I.   2. HLD - 07/13/2016: Cholesterol 271; HDL 44.20; Triglycerides 352.0; VLDL 70.4 - Started on Crestor 20mg  09/30/16. Will need LFT and Lipid panel in 6 weeks.   3. HTN -Stable and well controlled on current medications.    The patient has been seen by Dr. Johney Frame  today and deemed ready for discharge home. All follow-up appointments have been scheduled. Discharge medications are listed below.  _____________   Discharge Vitals Blood pressure 113/69, pulse 78, temperature 98.5 F (36.9 C), temperature source Oral, resp. rate 16, height 5\' 7"  (1.702 m), weight 176 lb 2.4 oz (79.9 kg), SpO2 97 %.  Filed Weights   10/07/16 0535 10/08/16 0400  Weight: 178 lb (80.7 kg) 176 lb 2.4 oz (79.9 kg)    Labs & Radiologic Studies     CBC  Recent Labs  10/07/16 1322 10/08/16 0336  WBC 7.1 7.0  HGB 14.3 14.2  HCT 41.2 41.9  MCV 84.3  85.7  PLT 233 226   Basic Metabolic Panel  Recent Labs  10/07/16 1322 10/08/16 0336  NA  --  141  K  --  4.7  CL  --  108  CO2  --  27  GLUCOSE  --  103*  BUN  --  10  CREATININE 0.94 1.09  CALCIUM  --  9.2   Liver Function Tests No results for input(s): AST, ALT, ALKPHOS, BILITOT, PROT, ALBUMIN in the last 72 hours. No results for input(s): LIPASE, AMYLASE in the  last 72 hours. Cardiac Enzymes No results for input(s): CKTOTAL, CKMB, CKMBINDEX, TROPONINI in the last 72 hours. BNP Invalid input(s): POCBNP D-Dimer No results for input(s): DDIMER in the last 72 hours. Hemoglobin A1C No results for input(s): HGBA1C in the last 72 hours. Fasting Lipid Panel No results for input(s): CHOL, HDL, LDLCALC, TRIG, CHOLHDL, LDLDIRECT in the last 72 hours. Thyroid Function Tests No results for input(s): TSH, T4TOTAL, T3FREE, THYROIDAB in the last 72 hours.  Invalid input(s): FREET3  No results found.  Disposition   Pt is being discharged home today in good condition.  Follow-up Plans & Appointments    Follow-up Information    Donato SchultzMark Skains, MD. Go on 10/21/2016.   Specialty:  Cardiology Why:  @3 :35 pm for post hospital  Contact information: [address] [place] [address] [phone]          Discharge Instructions    Diet - low sodium heart healthy    Complete by:  As directed    Discharge instructions    Complete by:  As directed    No driving for 48 hours. No lifting over 5 lbs for 1 week. No sexual activity for 1 week. You may return to work on 10/12/16. Keep procedure site clean & dry. If you notice increased pain, swelling, bleeding or pus, call/return!  You may shower, but no soaking baths/hot tubs/pools for 1 week.   Increase activity slowly    Complete by:  As directed       Discharge Medications   Current Discharge Medication List    START taking these medications   Details  clopidogrel (PLAVIX) 75 MG tablet Take 1 tablet (75 mg total) by mouth daily with breakfast. Qty: 90 tablet, Refills: 3    isosorbide mononitrate (IMDUR) 30 MG 24 hr tablet Take 0.5 tablets (15 mg total) by mouth daily. Qty: 30 tablet, Refills: 6      CONTINUE these medications which have NOT CHANGED   Details  aspirin EC 81 MG tablet Take 1 tablet (81 mg total) by mouth daily. Qty: 30 tablet, Refills: 11    Cholecalciferol  (VITAMIN D3) 2000 units TABS Take 1 tablet by mouth daily.    lisinopril (PRINIVIL,ZESTRIL) 20 MG tablet Take 1 tablet (20 mg total) by mouth daily. Qty: 90 tablet, Refills: 1   Associated Diagnoses: Essential hypertension    metoprolol succinate (TOPROL-XL) 25 MG 24 hr tablet Take 1 tablet (25 mg total) by mouth daily. Qty: 30 tablet, Refills: 11    Multiple Vitamins-Minerals (MULTIVITAMIN ADULT PO) Take 1 tablet by mouth daily.    nitroGLYCERIN (NITROSTAT) 0.4 MG SL tablet Place 1 tablet (0.4 mg total) under the tongue every 5 (five) minutes as needed for chest pain. Qty: 25 tablet, Refills: prn    ranitidine (ZANTAC) 150 MG capsule Take 150 mg by mouth 2 (two) times daily.    rosuvastatin (CRESTOR) 20 MG tablet Take 1 tablet (20 mg total)  by mouth daily. Qty: 30 tablet, Refills: 11    vortioxetine HBr (TRINTELLIX) 20 MG TABS Take 20 mg by mouth daily. Qty: 90 tablet, Refills: 1   Associated Diagnoses: Major depressive disorder with single episode, in partial remission (HCC)          Outstanding Labs/Studies   LFT and Lipid panel in 6 weeks  Duration of Discharge Encounter   Greater than 30 minutes including physician time.  Signed, Bhagat,Bhavinkumar PA-C 10/08/2016, 8:32 AM  I have seen, examined the patient, and reviewed the above assessment and plan.  On exam, wrist looks good.  Ambulatory.  Changes to above are made where necessary.  Cardiac rehab and compliance with medicines encouraged.  Will need close outpatient follow-up  Co Sign: Hillis Range, MD 10/08/2016 9:05 AM

## 2016-10-08 NOTE — Progress Notes (Signed)
Progress Note  Patient Name: Sanda KleinDean Mcclory Date of Encounter: 10/08/2016  Primary Cardiologist: Dr. Anne FuSkains  Subjective   Feeling well. No chest pain, sob or palpitations.   Inpatient Medications    Scheduled Meds: . aspirin  81 mg Oral Daily  . clopidogrel  75 mg Oral Q breakfast  . enoxaparin (LOVENOX) injection  40 mg Subcutaneous Q24H  . famotidine  20 mg Oral BID  . isosorbide mononitrate  15 mg Oral Daily  . metoprolol succinate  25 mg Oral Daily  . rosuvastatin  20 mg Oral Daily  . sodium chloride flush  3 mL Intravenous Q12H  . vortioxetine HBr  20 mg Oral Daily   Continuous Infusions:  PRN Meds: sodium chloride, acetaminophen, nitroGLYCERIN, ondansetron (ZOFRAN) IV, sodium chloride flush   Vital Signs    Vitals:   10/07/16 1700 10/07/16 1932 10/08/16 0400 10/08/16 0716  BP: (!) 96/56 111/75 117/71 113/69  Pulse: 71 80 65 78  Resp: 15 20 10 16   Temp:  98.1 F (36.7 C) 97.8 F (36.6 C) 98.5 F (36.9 C)  TempSrc:  Oral Oral Oral  SpO2: (!) 15% 97% 95% 97%  Weight:   176 lb 2.4 oz (79.9 kg)   Height:        Intake/Output Summary (Last 24 hours) at 10/08/16 0809 Last data filed at 10/08/16 0400  Gross per 24 hour  Intake           723.33 ml  Output             1650 ml  Net          -926.67 ml   Filed Weights   10/07/16 0535 10/08/16 0400  Weight: 178 lb (80.7 kg) 176 lb 2.4 oz (79.9 kg)    Telemetry    NSR - Personally Reviewed  ECG    Sinus rhythm with TWI in inferior leads- Personally Reviewed  Physical Exam   GEN: No acute distress.  Neck: No JVD Cardiac: RRR, no murmurs, rubs, or gallops. R radial site stable.  Respiratory: Clear to auscultation bilaterally. GI: Soft, nontender, non-distended  MS: No edema; No deformity. Neuro:  AAOx3. Psych: Normal affect  Labs    Chemistry Recent Labs Lab 10/07/16 1322 10/08/16 0336  NA  --  141  K  --  4.7  CL  --  108  CO2  --  27  GLUCOSE  --  103*  BUN  --  10  CREATININE  0.94 1.09  CALCIUM  --  9.2  GFRNONAA >60 >60  GFRAA >60 >60  ANIONGAP  --  6     Hematology Recent Labs Lab 10/07/16 1322 10/08/16 0336  WBC 7.1 7.0  RBC 4.89 4.89  HGB 14.3 14.2  HCT 41.2 41.9  MCV 84.3 85.7  MCH 29.2 29.0  MCHC 34.7 33.9  RDW 12.4 12.5  PLT 233 226    Cardiac EnzymesNo results for input(s): TROPONINI in the last 168 hours. No results for input(s): TROPIPOC in the last 168 hours.   BNPNo results for input(s): BNP, PROBNP in the last 168 hours.   DDimer No results for input(s): DDIMER in the last 168 hours.   Radiology    No results found.  Cardiac Studies   Coronary Stent Intervention  Left Heart Cath and Coronary Angiography  Conclusion   FINDINGS: 1. 95% proximal RCA stenosis with competitive filling of the rPL branches via the LCx. 2. Mild to moderate, non-obstructive CAD involving the left coronary  artery. 3. Pressure dampening in the LMCA and ostial RCA with catheter engagement that improved after intracoronary nitroglycerin consistent with vasospasm. 4. Normal left ventricular filling pressure. 5. Normal left ventricular contraction. 6. Successful PCI to proximal RCA with placement of Promus Premier 3.0 x 16 mm drug-eluting stent post-dilated with 3.5 mm [place] balloon.  RECOMMENDATIONS: 1. Admit for overnight observation following PCI given significant vasospasm. 2. Patient loaded with prasugrel in the cath lab and will continue clopidogrel 75 mg daily for at least 6 months, ideally longer. 3. Aggressive secondary prevention and medical therapy for component of vasospasm.     Patient Profile     56 y.o. male with hx of HTN, HLD and GERD who seen by clinic for chest pain and dyspnea on exertion x 6 months presented for scheduled cath.   Assessment & Plan    1. Angina, escalating - s/p Successful PTCA and DES to proximal RCA. rPL branches filling via the LCx. Mild to moderate disease elsewhere. Significant vasospasm during cath.  Continue ASA, Plavix, Imdur, BB and statin. Ambulate well today with CRP I.   2. HLD - 07/13/2016: Cholesterol 271; HDL 44.20; Triglycerides 352.0; VLDL 70.4 - Started on Crestor 20mg  09/30/16. Will need LFT and Lipid panel in 6 weeks.   3. HTN -Stable and well controlled on current medications.    Signed, Manson Passey, PA  10/08/2016, 8:09 AM

## 2016-10-08 NOTE — Progress Notes (Signed)
CARDIAC REHAB PHASE I   PRE:  Rate/Rhythm: 78 NSR  BP:   Sitting: 111/64     SaO2: 97% RA  MODE:  Ambulation: 500 ft   POST:  Rate/Rhythm: 86 NSR  BP:   Sitting: 128/80     SaO2: 97% RA 804-852  PT ambulated 52500ft, independently with a stable gait. Pt tolerated exercise well. Returned pt to bed side with VSS. Education was completed with pt and pt's spouse. Information was received well. Discussed HH diet, risk factors, lifting/activity restrictions, exercise guidelines, NTG use, temperature/emergency precautions. Pt has stent card in room, further discussed antiplatelets and importance of medication management. Pt verbalize understanding and did teach back. [facility].  Shon Mansouri D Camelia Stelzner,MS,ACSM-RCEP 10/08/2016 8:48 AM

## 2016-10-08 NOTE — Progress Notes (Signed)
Rounding done, no needs at this time. 

## 2016-10-08 NOTE — Progress Notes (Signed)
Patient ambulated 500 ft without any assistance.  No complaints of chest pain or shortness of breath 

## 2016-10-17 ENCOUNTER — Telehealth: Payer: Self-pay | Admitting: Cardiology

## 2016-10-17 NOTE — Telephone Encounter (Signed)
New message  Pt call requesting to speak with RN. Pt states he has a bad cough and a sore throat. Pt would like to take cough medicine and cough drops and would like to know if it will cause interaction with any medications on his med list. Please call back to discuss

## 2016-10-17 NOTE — Telephone Encounter (Signed)
Spoke with pt who wanted to make sure it is OK to use [employer] [place] cough drops.  Advised OK-he is only having a bit of a cough and a slight sore throat.  Reviewed OTC meds to take for coughs/cold and when to go to the MD.  He thanked me for my time and information.

## 2016-10-21 ENCOUNTER — Ambulatory Visit (INDEPENDENT_AMBULATORY_CARE_PROVIDER_SITE_OTHER): Payer: 59 | Admitting: Cardiology

## 2016-10-21 ENCOUNTER — Ambulatory Visit: Payer: 59 | Admitting: Cardiology

## 2016-10-21 ENCOUNTER — Encounter: Payer: Self-pay | Admitting: Cardiology

## 2016-10-21 VITALS — BP 118/72 | HR 73 | Ht 67.0 in | Wt 176.6 lb

## 2016-10-21 DIAGNOSIS — Z8249 Family history of ischemic heart disease and other diseases of the circulatory system: Secondary | ICD-10-CM

## 2016-10-21 DIAGNOSIS — I2510 Atherosclerotic heart disease of native coronary artery without angina pectoris: Secondary | ICD-10-CM | POA: Diagnosis not present

## 2016-10-21 DIAGNOSIS — E7800 Pure hypercholesterolemia, unspecified: Secondary | ICD-10-CM | POA: Diagnosis not present

## 2016-10-21 NOTE — Patient Instructions (Signed)
Medication Instructions:  The current medical regimen is effective;  continue present plan and medications.  Follow-Up: Follow up in 4 months with Dr Skains.  If you need a refill on your cardiac medications before your next appointment, please call your pharmacy.  Thank you for choosing [facility]!!     

## 2016-10-21 NOTE — Progress Notes (Signed)
Cardiology Office Note    Date:  10/21/2016   ID:  Jordan Hendricks, DOB [birth year], MRN [id number]  PCP:  Sharlene Dory, DO  Cardiologist:   Donato Schultz, MD     History of Present Illness:  Jordan Hendricks is a 56 y.o. male here for Follow-up of coronary artery disease with proximal RCA stent placed 10/07/16.   Overall he is doing very well postop placement. No chest pain, no shortness of breath, no fevers chills. He does occasionally have a cough, he was on lisinopril for quite some time prior. Mild knot right above arteriotomy site radial. Mild ecchymosis noted. Overall excellent pulses.  Review of his previous office " note on 08/24/16 he was feeling dyspnea on exertion, central chest tightness, jaw pain. 6 months duration. He is nervous about this, moderate in severity. 3 flts of stairs, aches in teeth and jaw. Ran marathon in 2007. Stopped prior Crestor use Bactroban as well. Shoulder 2015 surgery hurt soccer.  He has a history of hypertension, hyperlipidemia and heart disease with his father, age 60 had CABG. Nonsmoker, nondiabetic."  He's also been bothered with GERD. Moved from [place] in July 2017.     Past Medical History:  Diagnosis Date  . Anginal pain (HCC)   . Coronary artery disease   . Depression   . Essential hypertension 07/13/2016  . Family history of adverse reaction to anesthesia    " My Dad had bad reactions ,He would wake up & have hallucinations"  . GERD (gastroesophageal reflux disease)   . History of chicken pox   . History of colon polyps   . Hyperlipidemia     Past Surgical History:  Procedure Laterality Date  . CARDIAC CATHETERIZATION  10/07/2016  . CARDIAC CATHETERIZATION N/A 10/07/2016   Procedure: Left Heart Cath and Coronary Angiography;  Surgeon: Yvonne Kendall, MD;  Location: [facility] INVASIVE CV LAB;  Service: Cardiovascular;  Laterality: N/A;  . CARDIAC CATHETERIZATION N/A 10/07/2016   Procedure: Coronary Stent Intervention;   Surgeon: Yvonne Kendall, MD;  Location: [facility] INVASIVE CV LAB;  Service: Cardiovascular;  Laterality: N/A;  RCA  . PERCUTANEOUS CORONARY STENT INTERVENTION (PCI-S)  10/07/2016   RCA  . REFRACTIVE SURGERY Bilateral   . ROTATOR CUFF REPAIR Right 2015  . WISDOM TOOTH EXTRACTION      Current Medications: Outpatient Medications Prior to Visit  Medication Sig Dispense Refill  . aspirin EC 81 MG tablet Take 1 tablet (81 mg total) by mouth daily. 30 tablet 11  . Cholecalciferol (VITAMIN D3) 2000 units TABS Take 1 tablet by mouth daily.    . clopidogrel (PLAVIX) 75 MG tablet Take 1 tablet (75 mg total) by mouth daily with breakfast. 90 tablet 3  . isosorbide mononitrate (IMDUR) 30 MG 24 hr tablet Take 0.5 tablets (15 mg total) by mouth daily. 30 tablet 6  . lisinopril (PRINIVIL,ZESTRIL) 20 MG tablet Take 1 tablet (20 mg total) by mouth daily. 90 tablet 1  . metoprolol succinate (TOPROL-XL) 25 MG 24 hr tablet Take 1 tablet (25 mg total) by mouth daily. 30 tablet 11  . Multiple Vitamins-Minerals (MULTIVITAMIN ADULT PO) Take 1 tablet by mouth daily.    . nitroGLYCERIN (NITROSTAT) 0.4 MG SL tablet Place 1 tablet (0.4 mg total) under the tongue every 5 (five) minutes as needed for chest pain. 25 tablet prn  . ranitidine (ZANTAC) 150 MG capsule Take 150 mg by mouth 2 (two) times daily.    . rosuvastatin (CRESTOR) 20 MG tablet Take 1 tablet (  20 mg total) by mouth daily. 30 tablet 11  . vortioxetine HBr (TRINTELLIX) 20 MG TABS Take 20 mg by mouth daily. 90 tablet 1   No facility-administered medications prior to visit.      Allergies:   Lactose intolerance (gi)   Social History   Social History  . Marital status: Married    Spouse name: N/A  . Number of children: N/A  . Years of education: N/A   Social History Main Topics  . Smoking status: Never Smoker  . Smokeless tobacco: Never Used  . Alcohol use Yes     Comment: social  . Drug use: No  . Sexual activity: Not Asked   Other Topics Concern   . None   Social History Narrative  . None     Family History:  The patient's family history includes Alzheimer's disease in his father and paternal grandmother; Diabetes in his father and maternal grandmother; Heart disease in his father; Hypertension in his father; Prostate cancer in his father.   ROS:   Please see the history of present illness.    ROS All other systems reviewed and are negative.   PHYSICAL EXAM:   VS:  BP 118/72   Pulse 73   Ht 5\' 7"  (1.702 m)   Wt 176 lb 9.6 oz (80.1 kg)   BMI 27.66 kg/m    GEN: Well nourished, well developed, in no acute distress  HEENT: normal  Neck: no JVD, carotid bruits, or masses Cardiac: RRR; no murmurs, rubs, or gallops,no edema , 2+ radial pulses. Respiratory:  clear to auscultation bilaterally, normal work of breathing GI: soft, nontender, nondistended, + BS MS: no deformity or atrophy  Skin: warm and dry, no rash, normal pulses. Mild ecchymosis radial artery site. Mild knot radial artery site. Neuro:  Alert and Oriented x 3, Strength and sensation are intact Psych: euthymic mood, full affect  Wt Readings from Last 3 Encounters:  10/21/16 176 lb 9.6 oz (80.1 kg)  10/08/16 176 lb 2.4 oz (79.9 kg)  09/30/16 183 lb (83 kg)      Studies/Labs Reviewed:   EKG:  08/24/16-sinus rhythm with deeply inverted T waves in the inferior leads as well as lateral leads. Change from prior EKG.  Recent Labs: 08/24/2016: ALT 32 10/08/2016: BUN 10; Creatinine, Ser 1.09; Hemoglobin 14.2; Platelets 226; Potassium 4.7; Sodium 141   Lipid Panel    Component Value Date/Time   CHOL 271 (H) 07/13/2016 0808   TRIG 352.0 (H) 07/13/2016 0808   HDL 44.20 07/13/2016 0808   CHOLHDL 6 07/13/2016 0808   VLDL 70.4 (H) 07/13/2016 0808   LDLDIRECT 198.0 07/13/2016 0808    Additional studies/ records that were reviewed today include:  Prior office notes, lab work, EKG reviewed.  Conclusion   FINDINGS: 1. 95% proximal RCA stenosis with competitive  filling of the rPL branches via the LCx. 2. Mild to moderate, non-obstructive CAD involving the left coronary artery. 3. Pressure dampening in the LMCA and ostial RCA with catheter engagement that improved after intracoronary nitroglycerin consistent with vasospasm. 4. Normal left ventricular filling pressure. 5. Normal left ventricular contraction. 6. Successful PCI to proximal RCA with placement of Promus Premier 3.0 x 16 mm drug-eluting stent post-dilated with 3.5 mm [place] balloon.  RECOMMENDATIONS: 1. Admit for overnight observation following PCI given significant vasospasm. 2. Patient loaded with prasugrel in the cath lab and will continue clopidogrel 75 mg daily for at least 6 months, ideally longer. 3. Aggressive secondary prevention and medical therapy  for component of vasospasm.  Yvonne Kendall, MD     ASSESSMENT:    1. Coronary artery disease involving native coronary artery of native heart without angina pectoris   2. Family history of early CAD   3. Pure hypercholesterolemia      PLAN:  In order of problems listed above:  Coronary artery disease status post proximal RCA stent  - Doing very well, markedly improved symptoms. Walking now 15 minutes on the treadmill 1.8 miles per hour. No chest pain, no shortness of breath.  - Previously had T-wave inversions on EKG in the inferior lateral leads, change from prior, chest tightness, dyspnea on exertion and teeth/jaw pain as well as LDL of 198 and father who had bypass surgery in his 1s  - His wife Steward Drone has had one as well.  - Toprol-XL, continue with lisinopril  - Aspirin 81 mg, Plavix for 1 year  - Nitroglycerin when necessary  - Cardiac rehabilitation, diet   Hyperlipidemia  - Started back Crestor. He took 10 years ago.  Essential hypertension  - Toprol-XL 25., Already on lisinopril 20.  GERD  - Continuing with ranitidine.   Medication Adjustments/Labs and Tests Ordered: Current medicines are reviewed at  length with the patient today.  Concerns regarding medicines are outlined above.  Medication changes, Labs and Tests ordered today are listed in the Patient Instructions below. Patient Instructions  Medication Instructions:  The current medical regimen is effective;  continue present plan and medications.  Follow-Up: Follow up in 4 months with Dr Anne Fu.  If you need a refill on your cardiac medications before your next appointment, please call your pharmacy.  Thank you for choosing [facility]!!        Signed, Donato Schultz, MD  10/21/2016 12:10 PM    [facility] [address] [place], [place], [place]  [address] Phone: [phone]; Fax: [phone]

## 2016-10-25 ENCOUNTER — Ambulatory Visit: Payer: 59 | Admitting: Physician Assistant

## 2016-11-29 ENCOUNTER — Encounter (HOSPITAL_COMMUNITY)
Admission: RE | Admit: 2016-11-29 | Discharge: 2016-11-29 | Disposition: A | Discharge status: Home / Self Care | Payer: 59 | Source: Ambulatory Visit | Attending: Cardiology | Admitting: Cardiology

## 2016-11-29 ENCOUNTER — Encounter (HOSPITAL_COMMUNITY): Payer: Self-pay

## 2016-11-29 VITALS — BP 114/70 | HR 61 | Ht 68.0 in | Wt 175.7 lb

## 2016-11-29 DIAGNOSIS — Z48812 Encounter for surgical aftercare following surgery on the circulatory system: Secondary | ICD-10-CM | POA: Insufficient documentation

## 2016-11-29 DIAGNOSIS — Z955 Presence of coronary angioplasty implant and graft: Secondary | ICD-10-CM | POA: Insufficient documentation

## 2016-11-29 NOTE — Progress Notes (Signed)
Cardiac Individual Treatment Plan  Patient Details  Name: Jordan Hendricks MRN: [id number] Date of Birth: 02-11-1961 Referring Provider:   Flowsheet Row CARDIAC REHAB PHASE II ORIENTATION from 11/29/2016 in [facility] CARDIAC REHAB  Referring Provider  Donato Schultz MD      Initial Encounter Date:  Flowsheet Row CARDIAC REHAB PHASE II ORIENTATION from 11/29/2016 in [facility] CARDIAC REHAB  Date  11/29/16  Referring Provider  Donato Schultz MD      Visit Diagnosis: 1/19/18Status post coronary artery stent placement  Patient's Home Medications on Admission:  Current Outpatient Prescriptions:  .  acetaminophen (TYLENOL) 325 MG tablet, Take 650 mg by mouth every 6 (six) hours as needed for headache., Disp: , Rfl:  .  aspirin EC 81 MG tablet, Take 1 tablet (81 mg total) by mouth daily., Disp: 30 tablet, Rfl: 11 .  Cholecalciferol (VITAMIN D3) 2000 units TABS, Take 1 tablet by mouth daily., Disp: , Rfl:  .  clopidogrel (PLAVIX) 75 MG tablet, Take 1 tablet (75 mg total) by mouth daily with breakfast., Disp: 90 tablet, Rfl: 3 .  isosorbide mononitrate (IMDUR) 30 MG 24 hr tablet, Take 0.5 tablets (15 mg total) by mouth daily., Disp: 30 tablet, Rfl: 6 .  lisinopril (PRINIVIL,ZESTRIL) 20 MG tablet, Take 1 tablet (20 mg total) by mouth daily., Disp: 90 tablet, Rfl: 1 .  metoprolol succinate (TOPROL-XL) 25 MG 24 hr tablet, Take 1 tablet (25 mg total) by mouth daily., Disp: 30 tablet, Rfl: 11 .  Multiple Vitamins-Minerals (MULTIVITAMIN ADULT PO), Take 1 tablet by mouth daily., Disp: , Rfl:  .  nitroGLYCERIN (NITROSTAT) 0.4 MG SL tablet, Place 1 tablet (0.4 mg total) under the tongue every 5 (five) minutes as needed for chest pain., Disp: 25 tablet, Rfl: prn .  ranitidine (ZANTAC) 150 MG capsule, Take 150 mg by mouth 2 (two) times daily., Disp: , Rfl:  .  rosuvastatin (CRESTOR) 20 MG tablet, Take 1 tablet (20 mg total) by mouth daily. (Patient taking differently:  Take 20 mg by mouth every evening. ), Disp: 30 tablet, Rfl: 11 .  vortioxetine HBr (TRINTELLIX) 20 MG TABS, Take 20 mg by mouth daily., Disp: 90 tablet, Rfl: 1  Past Medical History: Past Medical History:  Diagnosis Date  . Anginal pain (HCC)   . Coronary artery disease   . Depression   . Essential hypertension 07/13/2016  . Family history of adverse reaction to anesthesia    " My Dad had bad reactions ,He would wake up & have hallucinations"  . GERD (gastroesophageal reflux disease)   . History of chicken pox   . History of colon polyps   . Hyperlipidemia     Tobacco Use: History  Smoking Status  . Never Smoker  Smokeless Tobacco  . Never Used    Labs: Recent Review Flowsheet Data    Labs for ITP Cardiac and Pulmonary Rehab Latest Ref Rng & Units 07/13/2016   Cholestrol 0 - 200 mg/dL 409(W)   LDLDIRECT mg/dL 119.1   HDL >47.82 mg/dL 95.62   Trlycerides 0.0 - 149.0 mg/dL 130.0(H)      Capillary Blood Glucose: No results found for: GLUCAP   Exercise Target Goals: Date: 11/29/16  Exercise Program Goal: Individual exercise prescription set with THRR, safety & activity barriers. Participant demonstrates ability to understand and report RPE using BORG scale, to self-measure pulse accurately, and to acknowledge the importance of the exercise prescription.  Exercise Prescription Goal: Starting with aerobic activity 30 plus minutes  a day, 3 days per week for initial exercise prescription. Provide home exercise prescription and guidelines that participant acknowledges understanding prior to discharge.  Activity Barriers & Risk Stratification:     Activity Barriers & Cardiac Risk Stratification - 11/29/16 1039      Activity Barriers & Cardiac Risk Stratification   Cardiac Risk Stratification High      6 Minute Walk:     6 Minute Walk    Row Name 11/29/16 1031         6 Minute Walk   Phase Initial     Distance 1898 feet     Walk Time 6 minutes     # of Rest  Breaks 0     MPH 3.59     METS 5.01     RPE 11     VO2 Peak 17.56     Symptoms No     Resting HR 61 bpm     Resting BP 114/70     Max Ex. HR 100 bpm     Max Ex. BP 142/58     2 Minute Post BP 122/64        Oxygen Initial Assessment:   Oxygen Re-Evaluation:   Oxygen Discharge (Final Oxygen Re-Evaluation):   Initial Exercise Prescription:     Initial Exercise Prescription - 11/29/16 1000      Date of Initial Exercise RX and Referring Provider   Date 11/29/16   Referring Provider Donato Schultz MD     Treadmill   MPH 2.3   Grade 2   Minutes 10   METs 3.4     Bike   Level 1   Minutes 10   METs 3.36     NuStep   Level 3   SPM 85   Minutes 10   METs 2.3     Prescription Details   Frequency (times per week) 3   Duration Progress to 30 minutes of continuous aerobic without signs/symptoms of physical distress     Intensity   THRR 40-80% of Max Heartrate 67-134   Ratings of Perceived Exertion 11-13   Perceived Dyspnea 0-4     Progression   Progression Continue to progress workloads to maintain intensity without signs/symptoms of physical distress.     Resistance Training   Training Prescription Yes   Weight 3lbs   Reps 10-15      Perform Capillary Blood Glucose checks as needed.  Exercise Prescription Changes:   Exercise Comments:   Exercise Goals and Review:      Exercise Goals    Row Name 11/29/16 0846             Exercise Goals   Increase Physical Activity Yes       Intervention Provide advice, education, support and counseling about physical activity/exercise needs.;Develop an individualized exercise prescription for aerobic and resistive training based on initial evaluation findings, risk stratification, comorbidities and participant's personal goals.       Expected Outcomes Achievement of increased cardiorespiratory fitness and enhanced flexibility, muscular endurance and strength shown through measurements of functional capacity and  personal statement of participant.       Increase Strength and Stamina Yes       Intervention Provide advice, education, support and counseling about physical activity/exercise needs.;Develop an individualized exercise prescription for aerobic and resistive training based on initial evaluation findings, risk stratification, comorbidities and participant's personal goals.       Expected Outcomes Achievement of increased cardiorespiratory fitness and enhanced flexibility, muscular  endurance and strength shown through measurements of functional capacity and personal statement of participant.          Exercise Goals Re-Evaluation :    Discharge Exercise Prescription (Final Exercise Prescription Changes):   Nutrition:  Target Goals: Understanding of nutrition guidelines, daily intake of sodium 1500mg , cholesterol 200mg , calories 30% from fat and 7% or less from saturated fats, daily to have 5 or more servings of fruits and vegetables.  Biometrics:     Pre Biometrics - 11/29/16 1043      Pre Biometrics   Waist Circumference 38 inches   Hip Circumference 37.75 inches   Waist to Hip Ratio 1.01 %   Triceps Skinfold 13 mm   % Body Fat 25.2 %   Grip Strength 41 kg   Flexibility 11.75 in   Single Leg Stand 30 seconds       Nutrition Therapy Plan and Nutrition Goals:   Nutrition Discharge: Nutrition Scores:   Nutrition Goals Re-Evaluation:   Nutrition Goals Re-Evaluation:   Nutrition Goals Discharge (Final Nutrition Goals Re-Evaluation):   Psychosocial: Target Goals: Acknowledge presence or absence of significant depression and/or stress, maximize coping skills, provide positive support system. Participant is able to verbalize types and ability to use techniques and skills needed for reducing stress and depression.  Initial Review & Psychosocial Screening:     Initial Psych Review & Screening - 11/29/16 1152      Family Dynamics   Comments --  biref psychosocial  assessment reveals no further assessment or intervention needed at this time.      Quality of Life Scores:     Quality of Life - 11/29/16 1045      Quality of Life Scores   Health/Function Pre 27.9 %   Socioeconomic Pre 25.57 %   Psych/Spiritual Pre 25.29 %   Family Pre 26.4 %   GLOBAL Pre 26.66 %      PHQ-9: Recent Review Flowsheet Data    There is no flowsheet data to display.     Interpretation of Total Score  Total Score Depression Severity:  1-4 = Minimal depression, 5-9 = Mild depression, 10-14 = Moderate depression, 15-19 = Moderately severe depression, 20-27 = Severe depression   Psychosocial Evaluation and Intervention:   Psychosocial Re-Evaluation:   Psychosocial Discharge (Final Psychosocial Re-Evaluation):   Vocational Rehabilitation: Provide vocational rehab assistance to qualifying candidates.   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 Participant will state understanding/return demonstration of topics presented.  Learning Barriers/Preferences:     Learning Barriers/Preferences - 11/29/16 1020      Learning Barriers/Preferences   Learning Barriers Sight  wears glasses   Learning Preferences Audio;Pictoral;Skilled Demonstration;Video;Verbal Instruction;Individual Instruction;Written Material      Education Topics: Count Your Pulse:  -Group instruction provided by verbal instruction, demonstration, patient participation and written materials to support subject.  Instructors address importance of being able to find your pulse and how to count your pulse when at home without a heart monitor.  Patients get hands on experience counting their  pulse with staff help and individually.   Heart Attack, Angina, and Risk Factor Modification:  -Group instruction provided by verbal instruction, video, and written materials to  support subject.  Instructors address signs and symptoms of angina and heart attacks.    Also discuss risk factors for heart disease and how to make changes to improve heart health risk factors.   Functional Fitness:  -Group instruction provided by verbal instruction, demonstration, patient participation, and written materials to support subject.  Instructors address safety measures for doing things around the house.  Discuss how to get up and down off the floor, how to pick things up properly, how to safely get out of a chair without assistance, and balance training.   Meditation and Mindfulness:  -Group instruction provided by verbal instruction, patient participation, and written materials to support subject.  Instructor addresses importance of mindfulness and meditation practice to help reduce stress and improve awareness.  Instructor also leads participants through a meditation exercise.    Stretching for Flexibility and Mobility:  -Group instruction provided by verbal instruction, patient participation, and written materials to support subject.  Instructors lead participants through series of stretches that are designed to increase flexibility thus improving mobility.  These stretches are additional exercise for major muscle groups that are typically performed during regular warm up and cool down.   Hands Only CPR Anytime:  -Group instruction provided by verbal instruction, video, patient participation and written materials to support subject.  Instructors co-teach with AHA video for hands only CPR.  Participants get hands on experience with mannequins.   Nutrition I class: Heart Healthy Eating:  -Group instruction provided by PowerPoint slides, verbal discussion, and written materials to support subject matter.  The instructor gives an explanation and review of the Therapeutic Lifestyle Changes diet recommendations, which includes a discussion on lipid goals, dietary fat, sodium, fiber, plant stanol/sterol esters, sugar, and the components of a well-balanced, healthy diet.   Nutrition II class: Lifestyle Skills:  -Group instruction provided by PowerPoint slides, verbal discussion, and written materials to support subject matter. The instructor gives an explanation and review of label reading, grocery shopping for heart health, heart healthy recipe modifications, and ways to make healthier choices when eating out.   Diabetes Question & Answer:  -Group instruction provided by PowerPoint slides, verbal discussion, and written materials to support subject matter. The instructor gives an explanation and review of diabetes co-morbidities, pre- and post-prandial blood glucose goals, pre-exercise blood glucose goals, signs, symptoms, and treatment of hypoglycemia and hyperglycemia, and foot care basics.   Diabetes Blitz:  -Group instruction provided by PowerPoint slides, verbal discussion, and written materials to support subject matter. The instructor gives an explanation and review of the physiology behind type 1 and type 2 diabetes, diabetes medications and rational behind using different medications, pre- and post-prandial blood glucose recommendations and Hemoglobin A1c goals, diabetes diet, and exercise including blood glucose guidelines for exercising safely.    Portion Distortion:  -Group instruction provided by PowerPoint slides, verbal discussion, written materials, and food models to support subject matter. The instructor gives an explanation of serving size versus portion size, changes in portions sizes over the last 20 years, and what consists of a serving from each food group.   Stress Management:  -Group instruction provided by verbal instruction, video, and written materials to support subject  matter.  Instructors review role of stress in heart disease and how to cope with stress positively.     Exercising on Your Own:  -Group instruction provided by verbal instruction, power point, and written materials to support subject.  Instructors discuss benefits of exercise, components of exercise, frequency and intensity of exercise,  and end points for exercise.  Also discuss use of nitroglycerin and activating EMS.  Review options of places to exercise outside of rehab.  Review guidelines for sex with heart disease.   Cardiac Drugs I:  -Group instruction provided by verbal instruction and written materials to support subject.  Instructor reviews cardiac drug classes: antiplatelets, anticoagulants, beta blockers, and statins.  Instructor discusses reasons, side effects, and lifestyle considerations for each drug class.   Cardiac Drugs II:  -Group instruction provided by verbal instruction and written materials to support subject.  Instructor reviews cardiac drug classes: angiotensin converting enzyme inhibitors (ACE-I), angiotensin II receptor blockers (ARBs), nitrates, and calcium channel blockers.  Instructor discusses reasons, side effects, and lifestyle considerations for each drug class.   Anatomy and Physiology of the Circulatory System:  -Group instruction provided by verbal instruction, video, and written materials to support subject.  Reviews functional anatomy of heart, how it relates to various diagnoses, and what role the heart plays in the overall system.   Knowledge Questionnaire Score:     Knowledge Questionnaire Score - 11/29/16 1025      Knowledge Questionnaire Score   Pre Score 22/24      Core Components/Risk Factors/Patient Goals at Admission:     Personal Goals and Risk Factors at Admission - 11/29/16 1252      Core Components/Risk Factors/Patient Goals on Admission    Weight Management Yes;Weight Maintenance;Weight Loss   Intervention Weight Management:  Provide education and appropriate resources to help participant work on and attain dietary goals.;Weight Management: Develop a combined nutrition and exercise program designed to reach desired caloric intake, while maintaining appropriate intake of nutrient and fiber, sodium and fats, and appropriate energy expenditure required for the weight goal.   Admit Weight 175 lb 11.3 oz (79.7 kg)   Goal Weight: Long Term 155 lb (70.3 kg)   Expected Outcomes Short Term: Continue to assess and modify interventions until short term weight is achieved;Weight Maintenance: Understanding of the daily nutrition guidelines, which includes 25-35% calories from fat, 7% or less cal from saturated fats, less than 200mg  cholesterol, less than 1.5gm of sodium, & 5 or more servings of fruits and vegetables daily;Weight Loss: Understanding of general recommendations for a balanced deficit meal plan, which promotes 1-2 lb weight loss per week and includes a negative energy balance of [phone] kcal/d;Understanding recommendations for meals to include 15-35% energy as protein, 25-35% energy from fat, 35-60% energy from carbohydrates, less than 200mg  of dietary cholesterol, 20-35 gm of total fiber daily;Understanding of distribution of calorie intake throughout the day with the consumption of 4-5 meals/snacks   Hypertension Yes   Intervention Provide education on lifestyle modifcations including regular physical activity/exercise, weight management, moderate sodium restriction and increased consumption of fresh fruit, vegetables, and low fat dairy, alcohol moderation, and smoking cessation.;Monitor prescription use compliance.   Expected Outcomes Short Term: Continued assessment and intervention until BP is < 140/19mm HG in hypertensive participants. < 130/104mm HG in hypertensive participants with diabetes, heart failure or chronic kidney disease.;Long Term: Maintenance of blood pressure at goal levels.   Lipids Yes   Intervention  Provide education and support for participant on nutrition & aerobic/resistive exercise along with prescribed medications to achieve LDL 70mg , HDL >40mg .   Expected Outcomes Short Term: Participant states understanding of desired cholesterol values and is compliant with medications prescribed. Participant is following exercise prescription and nutrition guidelines.;Long Term: Cholesterol controlled with medications as prescribed, with individualized exercise RX and with personalized nutrition plan. Value  goals: LDL < 70mg , HDL > 40 mg.   Stress Yes   Intervention Offer individual and/or small group education and counseling on adjustment to heart disease, stress management and health-related lifestyle change. Teach and support self-help strategies.;Refer participants experiencing significant psychosocial distress to appropriate mental health specialists for further evaluation and treatment. When possible, include family members and significant others in education/counseling sessions.   Expected Outcomes Short Term: Participant demonstrates changes in health-related behavior, relaxation and other stress management skills, ability to obtain effective social support, and compliance with psychotropic medications if prescribed.;Long Term: Emotional wellbeing is indicated by absence of clinically significant psychosocial distress or social isolation.      Core Components/Risk Factors/Patient Goals Review:    Core Components/Risk Factors/Patient Goals at Discharge (Final Review):    ITP Comments:     ITP Comments    Row Name 11/29/16 0842           ITP Comments Dr. Armanda Magic, Medical Director          Comments: Jordan Hendricks attended orientation from 0800 to 1000 to review rules and guidelines for program. Completed 6 minute walk test, Intitial ITP, and exercise prescription.  VSS. Telemetry-Sinus Rhythm.  Asymptomatic. Pearson plans to participate in cardiac rehab twice a week on Wednesday's and  Fridays.Gladstone Lighter, RN,BSN 11/29/2016 12:54 PM

## 2016-11-29 NOTE — Progress Notes (Signed)
Cardiac Rehab Medication Review by a Pharmacist  Does the patient  feel that his/her medications are working for him/her?  yes  Has the patient been experiencing any side effects to the medications prescribed?  yes  Does the patient measure his/her own blood pressure or blood glucose at home?  no   Does the patient have any problems obtaining medications due to transportation or finances?   no  Understanding of regimen: good Understanding of indications: good Potential of compliance: excellent    Pharmacist comments: Mr. Jordan Hendricks presents today for medication review. He reports never missing any doses. He does report occasional headache from Imdur that is relieved with Tylenol. Reviewed indication of each medication and importance for heart health. Patient verbalized understanding.   Jordan Hendricks, PharmD PGY1 Pharmacy Resident Pager: [phone] 11/29/2016 8:45 AM

## 2016-12-07 ENCOUNTER — Encounter (HOSPITAL_COMMUNITY)
Admission: RE | Admit: 2016-12-07 | Discharge: 2016-12-07 | Disposition: A | Discharge status: Home / Self Care | Payer: 59 | Source: Ambulatory Visit | Attending: Cardiology | Admitting: Cardiology

## 2016-12-07 DIAGNOSIS — Z48812 Encounter for surgical aftercare following surgery on the circulatory system: Secondary | ICD-10-CM | POA: Diagnosis not present

## 2016-12-07 DIAGNOSIS — Z955 Presence of coronary angioplasty implant and graft: Secondary | ICD-10-CM

## 2016-12-07 NOTE — Progress Notes (Signed)
Daily Session Note  Patient Details  Name: Jordan Hendricks MRN: [id number] Date of Birth: Mar 18, 1961 Referring Provider:     CARDIAC REHAB PHASE II ORIENTATION from 11/29/2016 in [place]  Referring Provider  Candee Furbish MD      Encounter Date: 12/07/2016  Check In:     Session Check In - 12/07/16 1546      Check-In   Location [facility]   Staff Present Barnet Pall, RN, Lise Auer, MS, Exercise Physiologist   Supervising physician immediately available to respond to emergencies [facility] immediately available   Physician(s) Dr Allyson Sabal   Medication changes reported     No   Fall or balance concerns reported    No   Tobacco Cessation No Change   Warm-up and Cool-down Performed as group-led instruction   Resistance Training Performed No   VAD Patient? No     Pain Assessment   Currently in Pain? No/denies      Capillary Blood Glucose: No results found for this or any previous visit (from the past 24 hour(s)).    History  Smoking Status  . Never Smoker  Smokeless Tobacco  . Never Used    Goals Met:  Exercise tolerated well  Goals Unmet:  Not Applicable  Comments: [name] started cardiac rehab today.  Pt tolerated light exercise without difficulty. VSS, telemetry-Sinus Rhythm, asymptomatic.  Medication list reconciled. Pt denies barriers to medicaiton compliance.  PSYCHOSOCIAL ASSESSMENT:  PHQ-0. Pt exhibits positive coping skills, hopeful outlook with supportive family. No psychosocial needs identified at this time, no psychosocial interventions necessary.    Pt enjoys listening to music, reading and watching TV.   Pt oriented to exercise equipment and routine.    Understanding verbalized.Barnet Pall, RN,BSN 12/07/2016 4:54 PM   Dr. Fransico Him is Medical Director for Cardiac Rehab at [facility].

## 2016-12-09 ENCOUNTER — Encounter (HOSPITAL_COMMUNITY)
Admission: RE | Admit: 2016-12-09 | Discharge: 2016-12-09 | Disposition: A | Discharge status: Home / Self Care | Payer: 59 | Source: Ambulatory Visit | Attending: Cardiology | Admitting: Cardiology

## 2016-12-09 DIAGNOSIS — Z48812 Encounter for surgical aftercare following surgery on the circulatory system: Secondary | ICD-10-CM | POA: Diagnosis not present

## 2016-12-09 DIAGNOSIS — Z955 Presence of coronary angioplasty implant and graft: Secondary | ICD-10-CM

## 2016-12-14 ENCOUNTER — Encounter (HOSPITAL_COMMUNITY)
Admission: RE | Admit: 2016-12-14 | Discharge: 2016-12-14 | Disposition: A | Discharge status: Home / Self Care | Payer: 59 | Source: Ambulatory Visit | Attending: Cardiology | Admitting: Cardiology

## 2016-12-14 DIAGNOSIS — Z955 Presence of coronary angioplasty implant and graft: Secondary | ICD-10-CM

## 2016-12-14 DIAGNOSIS — Z48812 Encounter for surgical aftercare following surgery on the circulatory system: Secondary | ICD-10-CM | POA: Diagnosis not present

## 2016-12-14 NOTE — Progress Notes (Signed)
Cardiac Individual Treatment Plan  Patient Details  Name: Chief Walkup MRN: [id number] Date of Birth: 1961/02/06 Referring Provider:     CARDIAC REHAB PHASE II ORIENTATION from 11/29/2016 in [facility] CARDIAC REHAB  Referring Provider  Donato Schultz MD      Initial Encounter Date:    CARDIAC REHAB PHASE II ORIENTATION from 11/29/2016 in [facility] CARDIAC REHAB  Date  11/29/16  Referring Provider  Donato Schultz MD      Visit Diagnosis: 1/19/18Status post coronary artery stent placement  Patient's Home Medications on Admission:  Current Outpatient Prescriptions:  .  acetaminophen (TYLENOL) 325 MG tablet, Take 650 mg by mouth every 6 (six) hours as needed for headache., Disp: , Rfl:  .  aspirin EC 81 MG tablet, Take 1 tablet (81 mg total) by mouth daily., Disp: 30 tablet, Rfl: 11 .  Cholecalciferol (VITAMIN D3) 2000 units TABS, Take 1 tablet by mouth daily., Disp: , Rfl:  .  clopidogrel (PLAVIX) 75 MG tablet, Take 1 tablet (75 mg total) by mouth daily with breakfast., Disp: 90 tablet, Rfl: 3 .  isosorbide mononitrate (IMDUR) 30 MG 24 hr tablet, Take 0.5 tablets (15 mg total) by mouth daily., Disp: 30 tablet, Rfl: 6 .  lisinopril (PRINIVIL,ZESTRIL) 20 MG tablet, Take 1 tablet (20 mg total) by mouth daily., Disp: 90 tablet, Rfl: 1 .  metoprolol succinate (TOPROL-XL) 25 MG 24 hr tablet, Take 1 tablet (25 mg total) by mouth daily., Disp: 30 tablet, Rfl: 11 .  Multiple Vitamins-Minerals (MULTIVITAMIN ADULT PO), Take 1 tablet by mouth daily., Disp: , Rfl:  .  nitroGLYCERIN (NITROSTAT) 0.4 MG SL tablet, Place 1 tablet (0.4 mg total) under the tongue every 5 (five) minutes as needed for chest pain., Disp: 25 tablet, Rfl: prn .  ranitidine (ZANTAC) 150 MG capsule, Take 150 mg by mouth 2 (two) times daily., Disp: , Rfl:  .  rosuvastatin (CRESTOR) 20 MG tablet, Take 1 tablet (20 mg total) by mouth daily. (Patient taking differently: Take 20 mg by mouth every  evening. ), Disp: 30 tablet, Rfl: 11 .  vortioxetine HBr (TRINTELLIX) 20 MG TABS, Take 20 mg by mouth daily., Disp: 90 tablet, Rfl: 1  Past Medical History: Past Medical History:  Diagnosis Date  . Anginal pain (HCC)   . Coronary artery disease   . Depression   . Essential hypertension 07/13/2016  . Family history of adverse reaction to anesthesia    " My Dad had bad reactions ,He would wake up & have hallucinations"  . GERD (gastroesophageal reflux disease)   . History of chicken pox   . History of colon polyps   . Hyperlipidemia     Tobacco Use: History  Smoking Status  . Never Smoker  Smokeless Tobacco  . Never Used    Labs: Recent Review Flowsheet Data    Labs for ITP Cardiac and Pulmonary Rehab Latest Ref Rng & Units 07/13/2016   Cholestrol 0 - 200 mg/dL 629(B)   LDLDIRECT mg/dL 284.1   HDL >32.44 mg/dL 01.02   Trlycerides 0.0 - 149.0 mg/dL 725.0(H)      Capillary Blood Glucose: No results found for: GLUCAP   Exercise Target Goals:    Exercise Program Goal: Individual exercise prescription set with THRR, safety & activity barriers. Participant demonstrates ability to understand and report RPE using BORG scale, to self-measure pulse accurately, and to acknowledge the importance of the exercise prescription.  Exercise Prescription Goal: Starting with aerobic activity 30 plus minutes  a day, 3 days per week for initial exercise prescription. Provide home exercise prescription and guidelines that participant acknowledges understanding prior to discharge.  Activity Barriers & Risk Stratification:     Activity Barriers & Cardiac Risk Stratification - 11/29/16 1039      Activity Barriers & Cardiac Risk Stratification   Cardiac Risk Stratification High      6 Minute Walk:     6 Minute Walk    Row Name 11/29/16 1031         6 Minute Walk   Phase Initial     Distance 1898 feet     Walk Time 6 minutes     # of Rest Breaks 0     MPH 3.59     METS 5.01      RPE 11     VO2 Peak 17.56     Symptoms No     Resting HR 61 bpm     Resting BP 114/70     Max Ex. HR 100 bpm     Max Ex. BP 142/58     2 Minute Post BP 122/64        Oxygen Initial Assessment:   Oxygen Re-Evaluation:   Oxygen Discharge (Final Oxygen Re-Evaluation):   Initial Exercise Prescription:     Initial Exercise Prescription - 11/29/16 1000      Date of Initial Exercise RX and Referring Provider   Date 11/29/16   Referring Provider Donato Schultz MD     Treadmill   MPH 2.3   Grade 2   Minutes 10   METs 3.4     Bike   Level 1   Minutes 10   METs 3.36     NuStep   Level 3   SPM 85   Minutes 10   METs 2.3     Prescription Details   Frequency (times per week) 3   Duration Progress to 30 minutes of continuous aerobic without signs/symptoms of physical distress     Intensity   THRR 40-80% of Max Heartrate 67-134   Ratings of Perceived Exertion 11-13   Perceived Dyspnea 0-4     Progression   Progression Continue to progress workloads to maintain intensity without signs/symptoms of physical distress.     Resistance Training   Training Prescription Yes   Weight 3lbs   Reps 10-15      Perform Capillary Blood Glucose checks as needed.  Exercise Prescription Changes:     Exercise Prescription Changes    Row Name 12/08/16 1600             Response to Exercise   Blood Pressure (Admit) 118/60       Blood Pressure (Exercise) 128/70       Blood Pressure (Exit) 112/70       Heart Rate (Admit) 82 bpm       Heart Rate (Exercise) 110 bpm       Heart Rate (Exit) 82 bpm       Rating of Perceived Exertion (Exercise) 12       Duration Progress to 45 minutes of aerobic exercise without signs/symptoms of physical distress       Intensity THRR unchanged         Progression   Average METs 3.28         Resistance Training   Training Prescription Yes       Weight 3lbs       Reps 10-15  Treadmill   MPH 2.3       Grade 2       Minutes  10       METs 3.4         Bike   Level 1       Minutes 10       METs 3.36         NuStep   Level 3       SPM 85       Minutes 10       METs 3.1          Exercise Comments:     Exercise Comments    Row Name 12/07/16 1635 12/08/16 1626         Exercise Comments Pt is off to a great start with exercise.  Pt is off to a great start with exercise         Exercise Goals and Review:     Exercise Goals    Row Name 11/29/16 0846             Exercise Goals   Increase Physical Activity Yes       Intervention Provide advice, education, support and counseling about physical activity/exercise needs.;Develop an individualized exercise prescription for aerobic and resistive training based on initial evaluation findings, risk stratification, comorbidities and participant's personal goals.       Expected Outcomes Achievement of increased cardiorespiratory fitness and enhanced flexibility, muscular endurance and strength shown through measurements of functional capacity and personal statement of participant.       Increase Strength and Stamina Yes       Intervention Provide advice, education, support and counseling about physical activity/exercise needs.;Develop an individualized exercise prescription for aerobic and resistive training based on initial evaluation findings, risk stratification, comorbidities and participant's personal goals.       Expected Outcomes Achievement of increased cardiorespiratory fitness and enhanced flexibility, muscular endurance and strength shown through measurements of functional capacity and personal statement of participant.          Exercise Goals Re-Evaluation :    Discharge Exercise Prescription (Final Exercise Prescription Changes):     Exercise Prescription Changes - 12/08/16 1600      Response to Exercise   Blood Pressure (Admit) 118/60   Blood Pressure (Exercise) 128/70   Blood Pressure (Exit) 112/70   Heart Rate (Admit) 82 bpm    Heart Rate (Exercise) 110 bpm   Heart Rate (Exit) 82 bpm   Rating of Perceived Exertion (Exercise) 12   Duration Progress to 45 minutes of aerobic exercise without signs/symptoms of physical distress   Intensity THRR unchanged     Progression   Average METs 3.28     Resistance Training   Training Prescription Yes   Weight 3lbs   Reps 10-15     Treadmill   MPH 2.3   Grade 2   Minutes 10   METs 3.4     Bike   Level 1   Minutes 10   METs 3.36     NuStep   Level 3   SPM 85   Minutes 10   METs 3.1      Nutrition:  Target Goals: Understanding of nutrition guidelines, daily intake of sodium 1500mg , cholesterol 200mg , calories 30% from fat and 7% or less from saturated fats, daily to have 5 or more servings of fruits and vegetables.  Biometrics:     Pre Biometrics - 11/29/16 1043  Pre Biometrics   Waist Circumference 38 inches   Hip Circumference 37.75 inches   Waist to Hip Ratio 1.01 %   Triceps Skinfold 13 mm   % Body Fat 25.2 %   Grip Strength 41 kg   Flexibility 11.75 in   Single Leg Stand 30 seconds       Nutrition Therapy Plan and Nutrition Goals:     Nutrition Therapy & Goals - 12/01/16 1449      Nutrition Therapy   Diet Therapeutic Lifestyle Changes     Personal Nutrition Goals   Nutrition Goal Wt loss of 1-2 lb/week to a wt loss goal of 6-24 lb at graduation from Cardiac Rehab     Intervention Plan   Intervention Prescribe, educate and counsel regarding individualized specific dietary modifications aiming towards targeted core components such as weight, hypertension, lipid management, diabetes, heart failure and other comorbidities.   Expected Outcomes Short Term Goal: Understand basic principles of dietary content, such as calories, fat, sodium, cholesterol and nutrients.;Long Term Goal: Adherence to prescribed nutrition plan.      Nutrition Discharge: Nutrition Scores:     Nutrition Assessments - 12/01/16 1449      MEDFICTS Scores    Pre Score 15      Nutrition Goals Re-Evaluation:   Nutrition Goals Re-Evaluation:   Nutrition Goals Discharge (Final Nutrition Goals Re-Evaluation):   Psychosocial: Target Goals: Acknowledge presence or absence of significant depression and/or stress, maximize coping skills, provide positive support system. Participant is able to verbalize types and ability to use techniques and skills needed for reducing stress and depression.  Initial Review & Psychosocial Screening:     Initial Psych Review & Screening - 11/29/16 1152      Family Dynamics   Comments --  biref psychosocial assessment reveals no further assessment or intervention needed at this time.      Quality of Life Scores:     Quality of Life - 11/29/16 1045      Quality of Life Scores   Health/Function Pre 27.9 %   Socioeconomic Pre 25.57 %   Psych/Spiritual Pre 25.29 %   Family Pre 26.4 %   GLOBAL Pre 26.66 %      PHQ-9: Recent Review Flowsheet Data    Depression screen [facility] 2/9 12/07/2016   Decreased Interest 0   Down, Depressed, Hopeless 0   PHQ - 2 Score 0     Interpretation of Total Score  Total Score Depression Severity:  1-4 = Minimal depression, 5-9 = Mild depression, 10-14 = Moderate depression, 15-19 = Moderately severe depression, 20-27 = Severe depression   Psychosocial Evaluation and Intervention:   Psychosocial Re-Evaluation:     Psychosocial Re-Evaluation    Row Name 12/14/16 1724             Psychosocial Re-Evaluation   Current issues with None Identified       Interventions Encouraged to attend Cardiac Rehabilitation for the exercise       Continue Psychosocial Services  No Follow up required          Psychosocial Discharge (Final Psychosocial Re-Evaluation):     Psychosocial Re-Evaluation - 12/14/16 1724      Psychosocial Re-Evaluation   Current issues with None Identified   Interventions Encouraged to attend Cardiac Rehabilitation for the exercise   Continue  Psychosocial Services  No Follow up required      Vocational Rehabilitation: Provide vocational rehab assistance to qualifying candidates.   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 Participant will state understanding/return demonstration of topics presented.  Learning Barriers/Preferences:     Learning Barriers/Preferences - 11/29/16 1020      Learning Barriers/Preferences   Learning Barriers Sight  wears glasses   Learning Preferences Audio;Pictoral;Skilled Demonstration;Video;Verbal Instruction;Individual Instruction;Written Material      Education Topics: Count Your Pulse:  -Group instruction provided by verbal instruction, demonstration, patient participation and written materials to support subject.  Instructors address importance of being able to find your pulse and how to count your pulse when at home without a heart monitor.  Patients get hands on experience counting their pulse with staff help and individually.   Heart Attack, Angina, and Risk Factor Modification:  -Group instruction provided by verbal instruction, video, and written materials to support subject.  Instructors address signs and symptoms of angina and heart attacks.    Also discuss risk factors for heart disease and how to make changes to improve heart health risk factors.   CARDIAC REHAB PHASE II EXERCISE from 12/14/2016 in [facility] CARDIAC REHAB  Date  12/14/16  Instruction Review Code  2- meets goals/outcomes      Functional Fitness:  -Group instruction provided by verbal instruction, demonstration, patient participation, and written materials to support subject.   Instructors address safety measures for doing things around the house.  Discuss how to get up and down off the floor, how to pick things up properly, how to safely get out of a chair without assistance, and balance training.   Meditation and Mindfulness:  -Group instruction provided by verbal instruction, patient participation, and written materials to support subject.  Instructor addresses importance of mindfulness and meditation practice to help reduce stress and improve awareness.  Instructor also leads participants through a meditation exercise.    Stretching for Flexibility and Mobility:  -Group instruction provided by verbal instruction, patient participation, and written materials to support subject.  Instructors lead participants through series of stretches that are designed to increase flexibility thus improving mobility.  These stretches are additional exercise for major muscle groups that are typically performed during regular warm up and cool down.   CARDIAC REHAB PHASE II EXERCISE from 12/14/2016 in [facility] CARDIAC REHAB  Date  12/09/16  Instruction Review Code  2- meets goals/outcomes      Hands Only CPR Anytime:  -Group instruction provided by verbal instruction, video, patient participation and written materials to support subject.  Instructors co-teach with AHA video for hands only CPR.  Participants get hands on experience with mannequins.   Nutrition I class: Heart Healthy Eating:  -Group instruction provided by PowerPoint slides, verbal discussion, and written materials to support subject matter. The instructor gives an explanation and review of the Therapeutic Lifestyle Changes diet recommendations, which includes a discussion on lipid goals, dietary fat, sodium, fiber, plant stanol/sterol esters, sugar, and the components of a well-balanced, healthy diet.   Nutrition II class: Lifestyle Skills:  -Group instruction provided by PowerPoint slides, verbal  discussion, and written materials to support subject matter. The instructor gives an explanation and review of label reading, grocery shopping for heart health, heart healthy recipe modifications, and ways to make healthier choices when eating out.  Diabetes Question & Answer:  -Group instruction provided by PowerPoint slides, verbal discussion, and written materials to support subject matter. The instructor gives an explanation and review of diabetes co-morbidities, pre- and post-prandial blood glucose goals, pre-exercise blood glucose goals, signs, symptoms, and treatment of hypoglycemia and hyperglycemia, and foot care basics.   Diabetes Blitz:  -Group instruction provided by PowerPoint slides, verbal discussion, and written materials to support subject matter. The instructor gives an explanation and review of the physiology behind type 1 and type 2 diabetes, diabetes medications and rational behind using different medications, pre- and post-prandial blood glucose recommendations and Hemoglobin A1c goals, diabetes diet, and exercise including blood glucose guidelines for exercising safely.    Portion Distortion:  -Group instruction provided by PowerPoint slides, verbal discussion, written materials, and food models to support subject matter. The instructor gives an explanation of serving size versus portion size, changes in portions sizes over the last 20 years, and what consists of a serving from each food group.   Stress Management:  -Group instruction provided by verbal instruction, video, and written materials to support subject matter.  Instructors review role of stress in heart disease and how to cope with stress positively.     Exercising on Your Own:  -Group instruction provided by verbal instruction, power point, and written materials to support subject.  Instructors discuss benefits of exercise, components of exercise, frequency and intensity of exercise, and end points for  exercise.  Also discuss use of nitroglycerin and activating EMS.  Review options of places to exercise outside of rehab.  Review guidelines for sex with heart disease.   CARDIAC REHAB PHASE [facility]  Date  12/07/16  Educator  Cristy Hiltslinty Richards  Instruction Review Code  2- meets goals/outcomes      Cardiac Drugs I:  -Group instruction provided by verbal instruction and written materials to support subject.  Instructor reviews cardiac drug classes: antiplatelets, anticoagulants, beta blockers, and statins.  Instructor discusses reasons, side effects, and lifestyle considerations for each drug class.   Cardiac Drugs II:  -Group instruction provided by verbal instruction and written materials to support subject.  Instructor reviews cardiac drug classes: angiotensin converting enzyme inhibitors (ACE-I), angiotensin II receptor blockers (ARBs), nitrates, and calcium channel blockers.  Instructor discusses reasons, side effects, and lifestyle considerations for each drug class.   Anatomy and Physiology of the Circulatory System:  -Group instruction provided by verbal instruction, video, and written materials to support subject.  Reviews functional anatomy of heart, how it relates to various diagnoses, and what role the heart plays in the overall system.   Knowledge Questionnaire Score:     Knowledge Questionnaire Score - 11/29/16 1025      Knowledge Questionnaire Score   Pre Score 22/24      Core Components/Risk Factors/Patient Goals at Admission:     Personal Goals and Risk Factors at Admission - 11/29/16 1252      Core Components/Risk Factors/Patient Goals on Admission    Weight Management Yes;Weight Maintenance;Weight Loss   Intervention Weight Management: Provide education and appropriate resources to help participant work on and attain dietary goals.;Weight Management: Develop a combined nutrition and exercise program  designed to reach desired caloric intake, while maintaining appropriate intake of nutrient and fiber, sodium and fats, and appropriate energy expenditure required for the weight goal.   Admit Weight 175 lb 11.3 oz (79.7 kg)   Goal Weight: Long Term 155 lb (70.3 kg)  Expected Outcomes Short Term: Continue to assess and modify interventions until short term weight is achieved;Weight Maintenance: Understanding of the daily nutrition guidelines, which includes 25-35% calories from fat, 7% or less cal from saturated fats, less than 200mg  cholesterol, less than 1.5gm of sodium, & 5 or more servings of fruits and vegetables daily;Weight Loss: Understanding of general recommendations for a balanced deficit meal plan, which promotes 1-2 lb weight loss per week and includes a negative energy balance of [phone] kcal/d;Understanding recommendations for meals to include 15-35% energy as protein, 25-35% energy from fat, 35-60% energy from carbohydrates, less than 200mg  of dietary cholesterol, 20-35 gm of total fiber daily;Understanding of distribution of calorie intake throughout the day with the consumption of 4-5 meals/snacks   Hypertension Yes   Intervention Provide education on lifestyle modifcations including regular physical activity/exercise, weight management, moderate sodium restriction and increased consumption of fresh fruit, vegetables, and low fat dairy, alcohol moderation, and smoking cessation.;Monitor prescription use compliance.   Expected Outcomes Short Term: Continued assessment and intervention until BP is < 140/59mm HG in hypertensive participants. < 130/86mm HG in hypertensive participants with diabetes, heart failure or chronic kidney disease.;Long Term: Maintenance of blood pressure at goal levels.   Lipids Yes   Intervention Provide education and support for participant on nutrition & aerobic/resistive exercise along with prescribed medications to achieve LDL 70mg , HDL >40mg .   Expected  Outcomes Short Term: Participant states understanding of desired cholesterol values and is compliant with medications prescribed. Participant is following exercise prescription and nutrition guidelines.;Long Term: Cholesterol controlled with medications as prescribed, with individualized exercise RX and with personalized nutrition plan. Value goals: LDL < 70mg , HDL > 40 mg.   Stress Yes   Intervention Offer individual and/or small group education and counseling on adjustment to heart disease, stress management and health-related lifestyle change. Teach and support self-help strategies.;Refer participants experiencing significant psychosocial distress to appropriate mental health specialists for further evaluation and treatment. When possible, include family members and significant others in education/counseling sessions.   Expected Outcomes Short Term: Participant demonstrates changes in health-related behavior, relaxation and other stress management skills, ability to obtain effective social support, and compliance with psychotropic medications if prescribed.;Long Term: Emotional wellbeing is indicated by absence of clinically significant psychosocial distress or social isolation.      Core Components/Risk Factors/Patient Goals Review:    Core Components/Risk Factors/Patient Goals at Discharge (Final Review):    ITP Comments:     ITP Comments    Row Name 11/29/16 0842           ITP Comments Dr. Armanda Magic, Medical Director          Comments: Tyton is making expected progress toward personal goals after completing 4 sessions. Recommend continued exercise and life style modification education including  stress management and relaxation techniques to decrease cardiac risk profile. Cope is off to a good start with exercise.Gladstone Lighter, RN,BSN 12/14/2016 5:28 PM

## 2016-12-16 ENCOUNTER — Encounter (HOSPITAL_COMMUNITY)
Admission: RE | Admit: 2016-12-16 | Discharge: 2016-12-16 | Disposition: A | Discharge status: Home / Self Care | Payer: 59 | Source: Ambulatory Visit | Attending: Cardiology | Admitting: Cardiology

## 2016-12-16 DIAGNOSIS — Z48812 Encounter for surgical aftercare following surgery on the circulatory system: Secondary | ICD-10-CM | POA: Diagnosis not present

## 2016-12-16 DIAGNOSIS — Z955 Presence of coronary angioplasty implant and graft: Secondary | ICD-10-CM

## 2016-12-21 ENCOUNTER — Encounter (HOSPITAL_COMMUNITY): Payer: 59

## 2016-12-23 ENCOUNTER — Encounter (HOSPITAL_COMMUNITY)
Admission: RE | Admit: 2016-12-23 | Discharge: 2016-12-23 | Disposition: A | Discharge status: Home / Self Care | Payer: 59 | Source: Ambulatory Visit | Attending: Cardiology | Admitting: Cardiology

## 2016-12-23 DIAGNOSIS — Z955 Presence of coronary angioplasty implant and graft: Secondary | ICD-10-CM | POA: Insufficient documentation

## 2016-12-23 DIAGNOSIS — Z48812 Encounter for surgical aftercare following surgery on the circulatory system: Secondary | ICD-10-CM | POA: Insufficient documentation

## 2016-12-26 ENCOUNTER — Encounter (HOSPITAL_COMMUNITY)
Admission: RE | Admit: 2016-12-26 | Discharge: 2016-12-26 | Disposition: A | Discharge status: Home / Self Care | Payer: 59 | Source: Ambulatory Visit | Attending: Cardiology | Admitting: Cardiology

## 2016-12-26 DIAGNOSIS — Z955 Presence of coronary angioplasty implant and graft: Secondary | ICD-10-CM

## 2016-12-26 DIAGNOSIS — Z48812 Encounter for surgical aftercare following surgery on the circulatory system: Secondary | ICD-10-CM | POA: Diagnosis not present

## 2016-12-27 NOTE — Progress Notes (Addendum)
Jordan Hendricks is moving to [place] later on this week and would like transfer to the cardiac rehab program at [facility].  [facility] Cardiac rehab program contacted. Tomorrow will be Jordan Hendricks's last day of exercise at cardiac rehab.Gladstone Lighter, RN,BSN 12/27/2016 10:05 AM

## 2016-12-28 ENCOUNTER — Encounter (HOSPITAL_COMMUNITY)
Admission: RE | Admit: 2016-12-28 | Discharge: 2016-12-28 | Disposition: A | Discharge status: Home / Self Care | Payer: 59 | Source: Ambulatory Visit | Attending: Cardiology | Admitting: Cardiology

## 2016-12-28 VITALS — Ht 68.0 in | Wt 171.7 lb

## 2016-12-28 DIAGNOSIS — Z48812 Encounter for surgical aftercare following surgery on the circulatory system: Secondary | ICD-10-CM | POA: Diagnosis not present

## 2016-12-28 DIAGNOSIS — Z955 Presence of coronary angioplasty implant and graft: Secondary | ICD-10-CM

## 2016-12-28 NOTE — Progress Notes (Addendum)
Discharge Summary  Patient Details  Name: Jordan Hendricks MRN: [id number] Date of Birth: 09/12/1961 Referring Provider:     [place] from 11/29/2016 in [place]  Referring Provider  Candee Furbish MD       Number of Visits: 8  Reason for Discharge:  Early Exit:  transferring to cardiac rehab at [facility]  Smoking History:  History  Smoking Status  . Never Smoker  Smokeless Tobacco  . Never Used    Diagnosis:  1/19/18Status post coronary artery stent placement  ADL UCSD:   Initial Exercise Prescription:     Initial Exercise Prescription - 11/29/16 1000      Date of Initial Exercise RX and Referring Provider   Date 11/29/16   Referring Provider Candee Furbish MD     Treadmill   MPH 2.3   Grade 2   Minutes 10   METs 3.4     Bike   Level 1   Minutes 10   METs 3.36     NuStep   Level 3   SPM 85   Minutes 10   METs 2.3     Prescription Details   Frequency (times per week) 3   Duration Progress to 30 minutes of continuous aerobic without signs/symptoms of physical distress     Intensity   THRR 40-80% of Max Heartrate 67-134   Ratings of Perceived Exertion 11-13   Perceived Dyspnea 0-4     Progression   Progression Continue to progress workloads to maintain intensity without signs/symptoms of physical distress.     Resistance Training   Training Prescription Yes   Weight 3lbs   Reps 10-15      Discharge Exercise Prescription (Final Exercise Prescription Changes):     Exercise Prescription Changes - 01/18/17 1600      Response to Exercise   Blood Pressure (Admit) 104/68   Blood Pressure (Exercise) 138/70   Blood Pressure (Exit) 104/70   Heart Rate (Admit) 82 bpm   Heart Rate (Exercise) 124 bpm   Heart Rate (Exit) 85 bpm   Rating of Perceived Exertion (Exercise) 11   Duration Progress to 45 minutes of aerobic exercise without signs/symptoms of physical distress    Intensity THRR unchanged     Progression   Average METs 4.6     Resistance Training   Training Prescription Yes   Weight 5lb   Reps 10-15     Treadmill   MPH 3   Grade 2   Minutes 10   METs 4.12     Bike   Level 1.5   Minutes 10   METs 4.56     NuStep   Level 4   SPM 85   Minutes 10   METs 5     Home Exercise Plan   Plans to continue exercise at Home (comment)   Frequency Add 4 additional days to program exercise sessions.   Initial Home Exercises Provided 12/14/16      Functional Capacity:     6 Minute Walk    Row Name 11/29/16 1031 01/18/17 1625       6 Minute Walk   Phase Initial Discharge    Distance 1898 feet 2260 feet    Distance % Change  - 19 %    Walk Time 6 minutes 6 minutes    # of Rest Breaks 0 0    MPH 3.59 4.3    METS 5.01 5.7    RPE  11 13    VO2 Peak 17.56 19.8    Symptoms No No    Resting HR 61 bpm 82 bpm    Resting BP 114/70 104/68    Max Ex. HR 100 bpm 101 bpm    Max Ex. BP 142/58 138/70    2 Minute Post BP 122/64 104/70       Psychological, QOL, Others - Outcomes: PHQ 2/9: Depression screen [facility] 2/9 12/28/2016 12/07/2016  Decreased Interest 0 0  Down, Depressed, Hopeless 0 0  PHQ - 2 Score 0 0    Quality of Life:     Quality of Life - 01/18/17 1639      Quality of Life Scores   Health/Function Pre 27.9 %   Health/Function Post 27.32 %   Health/Function % Change -2.08 %   Socioeconomic Pre 25.57 %   Socioeconomic Post 28.29 %   Socioeconomic % Change  10.64 %   Psych/Spiritual Pre 25.29 %   Psych/Spiritual Post 26.21 %   Psych/Spiritual % Change 3.64 %   Family Pre 26.4 %   Family Post 26.4 %   Family % Change 0 %   GLOBAL Pre 26.66 %   GLOBAL Post 27.15 %   GLOBAL % Change 1.84 %      Personal Goals: Goals established at orientation with interventions provided to work toward goal.     Personal Goals and Risk Factors at Admission - 11/29/16 1252      Core Components/Risk Factors/Patient Goals on Admission     Weight Management Yes;Weight Maintenance;Weight Loss   Intervention Weight Management: Provide education and appropriate resources to help participant work on and attain dietary goals.;Weight Management: Develop a combined nutrition and exercise program designed to reach desired caloric intake, while maintaining appropriate intake of nutrient and fiber, sodium and fats, and appropriate energy expenditure required for the weight goal.   Admit Weight 175 lb 11.3 oz (79.7 kg)   Goal Weight: Long Term 155 lb (70.3 kg)   Expected Outcomes Short Term: Continue to assess and modify interventions until short term weight is achieved;Weight Maintenance: Understanding of the daily nutrition guidelines, which includes 25-35% calories from fat, 7% or less cal from saturated fats, less than 265m cholesterol, less than 1.5gm of sodium, & 5 or more servings of fruits and vegetables daily;Weight Loss: Understanding of general recommendations for a balanced deficit meal plan, which promotes 1-2 lb weight loss per week and includes a negative energy balance of [phone] kcal/d;Understanding recommendations for meals to include 15-35% energy as protein, 25-35% energy from fat, 35-60% energy from carbohydrates, less than 2053mof dietary cholesterol, 20-35 gm of total fiber daily;Understanding of distribution of calorie intake throughout the day with the consumption of 4-5 meals/snacks   Hypertension Yes   Intervention Provide education on lifestyle modifcations including regular physical activity/exercise, weight management, moderate sodium restriction and increased consumption of fresh fruit, vegetables, and low fat dairy, alcohol moderation, and smoking cessation.;Monitor prescription use compliance.   Expected Outcomes Short Term: Continued assessment and intervention until BP is < 140/9065mG in hypertensive participants. < 130/40m71m in hypertensive participants with diabetes, heart failure or chronic kidney  disease.;Long Term: Maintenance of blood pressure at goal levels.   Lipids Yes   Intervention Provide education and support for participant on nutrition & aerobic/resistive exercise along with prescribed medications to achieve LDL <70mg65mL >40mg.85mxpected Outcomes Short Term: Participant states understanding of desired cholesterol values and is compliant with medications prescribed. Participant is  following exercise prescription and nutrition guidelines.;Long Term: Cholesterol controlled with medications as prescribed, with individualized exercise RX and with personalized nutrition plan. Value goals: LDL < 75m, HDL > 40 mg.   Stress Yes   Intervention Offer individual and/or small group education and counseling on adjustment to heart disease, stress management and health-related lifestyle change. Teach and support self-help strategies.;Refer participants experiencing significant psychosocial distress to appropriate mental health specialists for further evaluation and treatment. When possible, include family members and significant others in education/counseling sessions.   Expected Outcomes Short Term: Participant demonstrates changes in health-related behavior, relaxation and other stress management skills, ability to obtain effective social support, and compliance with psychotropic medications if prescribed.;Long Term: Emotional wellbeing is indicated by absence of clinically significant psychosocial distress or social isolation.       Personal Goals Discharge:   Nutrition & Weight - Outcomes:     Pre Biometrics - 11/29/16 1043      Pre Biometrics   Waist Circumference 38 inches   Hip Circumference 37.75 inches   Waist to Hip Ratio 1.01 %   Triceps Skinfold 13 mm   % Body Fat 25.2 %   Grip Strength 41 kg   Flexibility 11.75 in   Single Leg Stand 30 seconds         Post Biometrics - 01/18/17 1638       Post  Biometrics   Height _0  (1.727 m)   Weight 171 lb 11.8 oz (77.9  kg)   Waist Circumference 38 inches   Hip Circumference 38 inches   Waist to Hip Ratio 1 %   BMI (Calculated) 26.2   Triceps Skinfold 14 mm   % Body Fat 25.5 %   Grip Strength 51 kg   Flexibility 11.75 in   Single Leg Stand 30 seconds      Nutrition:     Nutrition Therapy & Goals - 12/01/16 1449      Nutrition Therapy   Diet Therapeutic Lifestyle Changes     Personal Nutrition Goals   Nutrition Goal Wt loss of 1-2 lb/week to a wt loss goal of 6-24 lb at graduation from [facility] educate and counsel regarding individualized specific dietary modifications aiming towards targeted core components such as weight, hypertension, lipid management, diabetes, heart failure and other comorbidities.   Expected Outcomes Short Term Goal: Understand basic principles of dietary content, such as calories, fat, sodium, cholesterol and nutrients.;Long Term Goal: Adherence to prescribed nutrition plan.      Nutrition Discharge:     Nutrition Assessments - 01/24/17 1412      MEDFICTS Scores   Pre Score 15   Post Score 12   Score Difference -3      Education Questionnaire Score:     Knowledge Questionnaire Score - 01/18/17 1625      Knowledge Questionnaire Score   Post Score 23/24      Goals reviewed with patient; copy given to patient.Jordan completed 8 exercise sessions in Phase II. Hendricks maintained good attendance and progressed nicely during his participation in rehab as evidenced by increased MET level.   Medication list reconciled. Repeat  PHQ score-0  .  Pt has made significant lifestyle changes and should be commended for his success. Pt feels he has achieved his goals during cardiac rehab.   [facility] [employer].Jordan Pall, RN,BSN 01/25/2017 5:10 PM

## 2016-12-30 ENCOUNTER — Telehealth: Payer: Self-pay | Admitting: Cardiology

## 2016-12-30 ENCOUNTER — Encounter (HOSPITAL_COMMUNITY): Payer: 59

## 2016-12-30 NOTE — Telephone Encounter (Signed)
Jordan Hendricks is aware orders will be signed and faxed back ASAP.

## 2016-12-30 NOTE — Telephone Encounter (Signed)
New Message     They need a order signed by Dr Anne Fu for pt to start Cardiac rehab with them

## 2017-01-04 ENCOUNTER — Encounter (HOSPITAL_COMMUNITY): Payer: 59

## 2017-01-06 ENCOUNTER — Encounter (HOSPITAL_COMMUNITY): Payer: 59

## 2017-01-11 ENCOUNTER — Encounter (HOSPITAL_COMMUNITY): Payer: 59

## 2017-01-12 ENCOUNTER — Telehealth: Payer: Self-pay | Admitting: Cardiology

## 2017-01-12 NOTE — Telephone Encounter (Signed)
Completed referral form and taken to MR to be faxed back.

## 2017-01-12 NOTE — Telephone Encounter (Signed)
Jordan Hendricks with [facility] is calling, states that she needs the ICD codes added to referral so that she can schedule. Thanks. Fax # [phone].

## 2017-01-13 ENCOUNTER — Encounter (HOSPITAL_COMMUNITY): Payer: 59

## 2017-01-13 ENCOUNTER — Other Ambulatory Visit (INDEPENDENT_AMBULATORY_CARE_PROVIDER_SITE_OTHER): Payer: 59

## 2017-01-13 ENCOUNTER — Other Ambulatory Visit: Payer: Self-pay | Admitting: Family Medicine

## 2017-01-13 ENCOUNTER — Other Ambulatory Visit: Payer: 59

## 2017-01-13 DIAGNOSIS — E781 Pure hyperglyceridemia: Secondary | ICD-10-CM | POA: Diagnosis not present

## 2017-01-13 LAB — LIPID PANEL
CHOL/HDL RATIO: 3
Cholesterol: 130 mg/dL (ref 0–200)
HDL: 45.8 mg/dL (ref >39.00)
LDL Cholesterol: 59 mg/dL (ref 0–99)
NonHDL: 84.39
TRIGLYCERIDES: 126 mg/dL (ref 0.0–149.0)
VLDL: 25.2 mg/dL (ref 0.0–40.0)

## 2017-01-18 ENCOUNTER — Encounter (HOSPITAL_COMMUNITY): Payer: 59

## 2017-01-18 DIAGNOSIS — I2510 Atherosclerotic heart disease of native coronary artery without angina pectoris: Secondary | ICD-10-CM | POA: Diagnosis not present

## 2017-01-20 ENCOUNTER — Encounter (HOSPITAL_COMMUNITY): Payer: 59

## 2017-01-23 ENCOUNTER — Other Ambulatory Visit: Payer: Self-pay | Admitting: Family Medicine

## 2017-01-23 DIAGNOSIS — F324 Major depressive disorder, single episode, in partial remission: Secondary | ICD-10-CM

## 2017-01-23 DIAGNOSIS — I10 Essential (primary) hypertension: Secondary | ICD-10-CM

## 2017-01-23 MED ORDER — LISINOPRIL 20 MG PO TABS: 20.0000 mg | ORAL_TABLET | Freq: Every day | ORAL | 1 refills | Status: DC

## 2017-01-23 NOTE — Telephone Encounter (Signed)
Requesting Trintellix 20mg -Take 20mg  by mouth daily. Last refill:07/13/16;#90,1 Last OV:08/24/16 Please advise.//AB/CMA

## 2017-01-23 NOTE — Telephone Encounter (Signed)
Called and spoke with the pt and informed him of the message below.  Pt verbalized understanding and agreed.  He requested to have the Lisinopril to be refilled.  Rx's sent to the pharmacy by e-script.  Pt was scheduled for 6 mos med check for (Wed-07/26/17 @ 7:00am).//AB/CMA

## 2017-01-23 NOTE — Telephone Encounter (Signed)
Please schedule for 6 mo med check. TY.

## 2017-01-24 NOTE — Addendum Note (Signed)
Encounter addended by: Jacques EarthlyFranko, Jaelen Soth Brewbaker, RD on: 01/24/2017  2:16 PM<BR>    Actions taken: Flowsheet data copied forward, Visit Navigator Flowsheet section accepted

## 2017-01-25 ENCOUNTER — Encounter (HOSPITAL_COMMUNITY): Payer: 59

## 2017-01-27 ENCOUNTER — Encounter (HOSPITAL_COMMUNITY): Payer: 59

## 2017-02-01 ENCOUNTER — Encounter (HOSPITAL_COMMUNITY): Payer: 59

## 2017-02-03 ENCOUNTER — Encounter (HOSPITAL_COMMUNITY): Payer: 59

## 2017-02-08 ENCOUNTER — Encounter (HOSPITAL_COMMUNITY): Payer: 59

## 2017-02-10 ENCOUNTER — Encounter (HOSPITAL_COMMUNITY): Payer: 59

## 2017-02-15 ENCOUNTER — Encounter (HOSPITAL_COMMUNITY): Payer: 59

## 2017-02-17 ENCOUNTER — Encounter (HOSPITAL_COMMUNITY): Payer: 59

## 2017-02-20 ENCOUNTER — Ambulatory Visit: Payer: 59 | Admitting: Cardiology

## 2017-02-20 DIAGNOSIS — I2510 Atherosclerotic heart disease of native coronary artery without angina pectoris: Secondary | ICD-10-CM | POA: Diagnosis not present

## 2017-02-21 ENCOUNTER — Encounter: Payer: Self-pay | Admitting: Cardiology

## 2017-02-22 ENCOUNTER — Telehealth: Payer: Self-pay | Admitting: Cardiology

## 2017-02-22 ENCOUNTER — Encounter (HOSPITAL_COMMUNITY): Payer: 59

## 2017-02-22 NOTE — Telephone Encounter (Signed)
Spoke with patient who is requesting [facility] cardiac re[facility].  Advised he will need to contact [facility] and have them fax their required order to be signed by Dr Anne FuSkains.  He states understanding and will ask them tomorrow when he goes to rehab.

## 2017-02-22 NOTE — Telephone Encounter (Signed)
New Message   pt verbalized that she is calling for rn   Pt wants 36 sessions for rehab and he is coming up on his 24 sessions  Pt wants to have it extended

## 2017-02-24 ENCOUNTER — Encounter (HOSPITAL_COMMUNITY): Payer: 59

## 2017-03-01 ENCOUNTER — Encounter (HOSPITAL_COMMUNITY): Payer: 59

## 2017-03-03 ENCOUNTER — Encounter (HOSPITAL_COMMUNITY): Payer: 59

## 2017-03-08 ENCOUNTER — Encounter (HOSPITAL_COMMUNITY): Payer: 59

## 2017-03-10 ENCOUNTER — Encounter (HOSPITAL_COMMUNITY): Payer: 59

## 2017-03-15 ENCOUNTER — Ambulatory Visit (INDEPENDENT_AMBULATORY_CARE_PROVIDER_SITE_OTHER): Payer: 59 | Admitting: Nurse Practitioner

## 2017-03-15 ENCOUNTER — Ambulatory Visit: Payer: 59 | Admitting: Cardiology

## 2017-03-15 ENCOUNTER — Encounter (HOSPITAL_COMMUNITY): Payer: 59

## 2017-03-15 ENCOUNTER — Encounter: Payer: Self-pay | Admitting: Nurse Practitioner

## 2017-03-15 VITALS — BP 140/64 | HR 76 | Ht 67.0 in | Wt 172.8 lb

## 2017-03-15 DIAGNOSIS — Z8249 Family history of ischemic heart disease and other diseases of the circulatory system: Secondary | ICD-10-CM | POA: Diagnosis not present

## 2017-03-15 DIAGNOSIS — E7800 Pure hypercholesterolemia, unspecified: Secondary | ICD-10-CM

## 2017-03-15 DIAGNOSIS — I2510 Atherosclerotic heart disease of native coronary artery without angina pectoris: Secondary | ICD-10-CM | POA: Diagnosis not present

## 2017-03-15 MED ORDER — ISOSORBIDE MONONITRATE ER 30 MG PO TB24: 15.0000 mg | ORAL_TABLET | Freq: Every day | ORAL | 11 refills | Status: DC

## 2017-03-15 MED ORDER — NITROGLYCERIN 0.4 MG SL SUBL: 0.4000 mg | SUBLINGUAL_TABLET | SUBLINGUAL | 99 refills | Status: DC | PRN

## 2017-03-15 NOTE — Patient Instructions (Addendum)
We will be checking the following labs today - NONE   Medication Instructions:    Continue with your current medicines.   I refilled your medicines today.     Testing/Procedures To Be Arranged:  N/A  Follow-Up:   See Dr. Anne FuSkains in 6 months.     Other Special Instructions:   Here are my tips to lose weight:  1. Drink only water. You do not need milk, juice, tea, soda or diet soda.  2. Do not eat anything "white". This includes white bread, potatoes, rice or mayo  3. Stay away from fried foods and sweets  4. Your portion should be the size of the palm of your hand.  5. Know what your weaknesses are and avoid.   6. Find an exercise you like and do it every day for 45 to 60 minutes.          If you need a refill on your cardiac medications before your next appointment, please call your pharmacy.   Call the [facility] office at [phone] if you have any questions, problems or concerns.

## 2017-03-15 NOTE — Progress Notes (Signed)
CARDIOLOGY OFFICE NOTE  Date:  03/15/2017    Jordan Hendricks Date of Birth: August 18, 1961 Medical Record #[id number]  PCP:  Sharlene Dory, DO  Cardiologist:  [facility]    Chief Complaint  Patient presents with  . Coronary Artery Disease    Follow up visit - seen for Dr. Anne Fu    History of Present Illness: Jordan Hendricks is a 56 y.o. male who presents today for a follow up visit. Seen for Dr. Anne Fu.   He has known CAD with recent stent proximal RCA stent placed 10/07/16 by Dr. Okey Dupre. Other issues include HLD and GERD.   Last seen in February and he was felt to be doing ok.    Comes in today. Here with his wife. He thought he was seeing Dr. Anne Fu today - his appointment had already been cancelled due to some misunderstanding. He needed to vent about this but was grateful for being able to be seen today. He has finished cardiac rehab. He needs medicines refilled. No chest pain. Breathing is fine. Tolerating his medicines.  Doing well. Tolerating his medicines. Does admit that his diet could be better. Likes M&Ms, doritos, Dr. Reino Kent. Not really exercising but did well with rehab.   Past Medical History:  Diagnosis Date  . Anginal pain (HCC)   . Coronary artery disease   . Depression   . Essential hypertension 07/13/2016  . Family history of adverse reaction to anesthesia    " My Dad had bad reactions ,He would wake up & have hallucinations"  . GERD (gastroesophageal reflux disease)   . History of chicken pox   . History of colon polyps   . Hyperlipidemia     Past Surgical History:  Procedure Laterality Date  . CARDIAC CATHETERIZATION  10/07/2016  . CARDIAC CATHETERIZATION N/A 10/07/2016   Procedure: Left Heart Cath and Coronary Angiography;  Surgeon: Yvonne Kendall, MD;  [facility]VAS[facility] CV LAB;  Service: Cardiovascular;  Laterality: N/A;  . CARDIAC CATHETERIZATION N/A 10/07/2016   Procedure: Coronary Stent Intervention;  Surgeon: Yvonne Kendall, MD;   Location: [facility] INVASIVE CV LAB;  Service: Cardiovascular;  Laterality: N/A;  RCA  . PERCUTANEOUS CORONARY STENT INTERVENTION (PCI-S)  10/07/2016   RCA  . REFRACTIVE SURGERY Bilateral   . ROTATOR CUFF REPAIR Right 2015  . WISDOM TOOTH EXTRACTION       Medications: Current Meds  Medication Sig  . acetaminophen (TYLENOL) 325 MG tablet Take 650 mg by mouth every 6 (six) hours as needed for headache.  Marland Kitchen aspirin EC 81 MG tablet Take 1 tablet (81 mg total) by mouth daily.  . Cholecalciferol (VITAMIN D3) 2000 units TABS Take 1 tablet by mouth daily.  . clopidogrel (PLAVIX) 75 MG tablet Take 1 tablet (75 mg total) by mouth daily with breakfast.  . isosorbide mononitrate (IMDUR) 30 MG 24 hr tablet Take 0.5 tablets (15 mg total) by mouth daily.  Marland Kitchen lisinopril (PRINIVIL,ZESTRIL) 20 MG tablet Take 1 tablet (20 mg total) by mouth daily.  . metoprolol succinate (TOPROL-XL) 25 MG 24 hr tablet Take 1 tablet (25 mg total) by mouth daily.  . Multiple Vitamins-Minerals (MULTIVITAMIN ADULT PO) Take 1 tablet by mouth daily.  . nitroGLYCERIN (NITROSTAT) 0.4 MG SL tablet Place 1 tablet (0.4 mg total) under the tongue every 5 (five) minutes as needed for chest pain.  . ranitidine (ZANTAC) 150 MG capsule Take 150 mg by mouth 2 (two) times daily.  . TRINTELLIX 20 MG TABS Take 20 mg by mouth  daily.  . [DISCONTINUED] isosorbide mononitrate (IMDUR) 30 MG 24 hr tablet Take 0.5 tablets (15 mg total) by mouth daily.  . [DISCONTINUED] nitroGLYCERIN (NITROSTAT) 0.4 MG SL tablet Place 1 tablet (0.4 mg total) under the tongue every 5 (five) minutes as needed for chest pain.     Allergies: Allergies  Allergen Reactions  . Lactose Intolerance (Gi)   . Tape Rash    Plastic tape    Social History: The patient  reports that he has never smoked. He has never used smokeless tobacco. He reports that he drinks alcohol. He reports that he does not use drugs.   Family History: The patient's family history includes Alzheimer's  disease in his father and paternal grandmother; Diabetes in his father and maternal grandmother; Heart disease in his father; Hypertension in his father; Prostate cancer in his father.   Review of Systems: Please see the history of present illness.   Otherwise, the review of systems is positive for none.   All other systems are reviewed and negative.   Physical Exam: VS:  BP 140/64 (BP Location: Left Arm, Patient Position: Sitting, Cuff Size: Normal)   Pulse 76   Ht 5\' 7"  (1.702 m)   Wt 172 lb 12.8 oz (78.4 kg)   BMI 27.06 kg/m  .  BMI Body mass index is 27.06 kg/m.  Wt Readings from Last 3 Encounters:  03/15/17 172 lb 12.8 oz (78.4 kg)  01/18/17 171 lb 11.8 oz (77.9 kg)  11/29/16 175 lb 11.3 oz (79.7 kg)    General: Pleasant. Well developed, well nourished and in no acute distress.   HEENT: Normal.  Neck: Supple, no JVD, carotid bruits, or masses noted.  Cardiac: Regular rate and rhythm. No murmurs, rubs, or gallops. No edema.  Respiratory:  Lungs are clear to auscultation bilaterally with normal work of breathing.  GI: Soft and nontender.  MS: No deformity or atrophy. Gait and ROM intact.  Skin: Warm and dry. Color is normal.  Neuro:  Strength and sensation are intact and no gross focal deficits noted.  Psych: Alert, appropriate and with normal affect.   LABORATORY DATA:  EKG:  EKG is not ordered today.  Lab Results  Component Value Date   WBC 7.0 10/08/2016   HGB 14.2 10/08/2016   HCT 41.9 10/08/2016   PLT 226 10/08/2016   GLUCOSE 103 (H) 10/08/2016   CHOL 130 01/13/2017   TRIG 126.0 01/13/2017   HDL 45.80 01/13/2017   LDLDIRECT 198.0 07/13/2016   LDLCALC 59 01/13/2017   ALT 32 08/24/2016   AST 22 08/24/2016   NA 141 10/08/2016   K 4.7 10/08/2016   CL 108 10/08/2016   CREATININE 1.09 10/08/2016   BUN 10 10/08/2016   CO2 27 10/08/2016   INR 1.0 09/30/2016   MICROALBUR 0.1 07/13/2016     BNP (last 3 results) No results for input(s): BNP in the last 8760  hours.  ProBNP (last 3 results) No results for input(s): PROBNP in the last 8760 hours.   Other Studies Reviewed Today:  Cardiac Cath/PCI Conclusion 09/2016  FINDINGS: 1. 95% proximal RCA stenosis with competitive filling of the rPL branches via the LCx. 2. Mild to moderate, non-obstructive CAD involving the left coronary artery. 3. Pressure dampening in the LMCA and ostial RCA with catheter engagement that improved after intracoronary nitroglycerin consistent with vasospasm. 4. Normal left ventricular filling pressure. 5. Normal left ventricular contraction. 6. Successful PCI to proximal RCA with placement of Promus Premier 3.0 x 16 mm  drug-eluting stent post-dilated with 3.5 mm [place] balloon.  RECOMMENDATIONS: 1. Admit for overnight observation following PCI given significant vasospasm. 2. Patient loaded with prasugrel in the cath lab and will continue clopidogrel 75 mg daily for at least 6 months, ideally longer. 3. Aggressive secondary prevention and medical therapy for component of vasospasm.  Yvonne Kendallhristopher End, MD     Assessment/Plan:  1. Coronary artery disease status post proximal RCA stent - has residual disease/vasospasm noted - continue with current regimen. No current symptoms. Discussed CV risk factor modification in depth.   2. Hyperlipidemia - on statin therapy -lipids from April noted.   3.  HTN - recheck by me is 120/60. I have left him on his current regimen.  4. GERD - does not seem to be an issue today.    Current medicines are reviewed with the patient today.  The patient does not have concerns regarding medicines other than what has been noted above.  The following changes have been made:  See above.  Labs/ tests ordered today include:   No orders of the defined types were placed in this encounter.    Disposition:   FU with Dr. Anne FuSkains in 6 months.    Patient is agreeable to this plan and will call if any problems develop in the interim.    SignedNorma Fredrickson: Jordan Lawrance, NP  03/15/2017 3:20 PM  [facility] [address] [place], [place]  [address] Phone: [phone] Fax: [phone]

## 2017-03-17 ENCOUNTER — Encounter (HOSPITAL_COMMUNITY): Payer: 59

## 2017-03-22 ENCOUNTER — Encounter (HOSPITAL_COMMUNITY): Payer: 59

## 2017-03-24 ENCOUNTER — Encounter (HOSPITAL_COMMUNITY): Payer: 59

## 2017-03-29 ENCOUNTER — Encounter (HOSPITAL_COMMUNITY): Payer: 59

## 2017-03-31 ENCOUNTER — Encounter (HOSPITAL_COMMUNITY): Payer: 59

## 2017-04-05 ENCOUNTER — Encounter (HOSPITAL_COMMUNITY): Payer: 59

## 2017-04-07 ENCOUNTER — Encounter (HOSPITAL_COMMUNITY): Payer: 59

## 2017-04-11 DIAGNOSIS — Z569 Unspecified problems related to employment: Secondary | ICD-10-CM | POA: Diagnosis not present

## 2017-04-11 DIAGNOSIS — Z630 Problems in relationship with spouse or partner: Secondary | ICD-10-CM | POA: Diagnosis not present

## 2017-04-12 ENCOUNTER — Encounter (HOSPITAL_COMMUNITY): Payer: 59

## 2017-04-13 ENCOUNTER — Other Ambulatory Visit: Payer: Self-pay | Admitting: Family Medicine

## 2017-04-13 DIAGNOSIS — F324 Major depressive disorder, single episode, in partial remission: Secondary | ICD-10-CM

## 2017-04-14 ENCOUNTER — Encounter (HOSPITAL_COMMUNITY): Payer: 59

## 2017-04-14 NOTE — Telephone Encounter (Signed)
Rx approved and sent to the pharmacy by e-script.//AB/CMA 

## 2017-04-17 DIAGNOSIS — Z630 Problems in relationship with spouse or partner: Secondary | ICD-10-CM | POA: Diagnosis not present

## 2017-04-17 DIAGNOSIS — Z569 Unspecified problems related to employment: Secondary | ICD-10-CM | POA: Diagnosis not present

## 2017-04-19 ENCOUNTER — Encounter (HOSPITAL_COMMUNITY): Payer: 59

## 2017-04-21 ENCOUNTER — Encounter (HOSPITAL_COMMUNITY): Payer: 59

## 2017-04-25 DIAGNOSIS — Z630 Problems in relationship with spouse or partner: Secondary | ICD-10-CM | POA: Diagnosis not present

## 2017-04-26 ENCOUNTER — Encounter (HOSPITAL_COMMUNITY): Payer: 59

## 2017-04-28 ENCOUNTER — Encounter (HOSPITAL_COMMUNITY): Payer: 59

## 2017-05-03 ENCOUNTER — Encounter (HOSPITAL_COMMUNITY): Payer: 59

## 2017-05-03 DIAGNOSIS — Z569 Unspecified problems related to employment: Secondary | ICD-10-CM | POA: Diagnosis not present

## 2017-05-03 DIAGNOSIS — Z630 Problems in relationship with spouse or partner: Secondary | ICD-10-CM | POA: Diagnosis not present

## 2017-05-05 ENCOUNTER — Encounter (HOSPITAL_COMMUNITY): Payer: 59

## 2017-05-08 DIAGNOSIS — Z630 Problems in relationship with spouse or partner: Secondary | ICD-10-CM | POA: Diagnosis not present

## 2017-05-08 DIAGNOSIS — Z569 Unspecified problems related to employment: Secondary | ICD-10-CM | POA: Diagnosis not present

## 2017-05-10 ENCOUNTER — Encounter (HOSPITAL_COMMUNITY): Payer: 59

## 2017-05-12 ENCOUNTER — Encounter (HOSPITAL_COMMUNITY): Payer: 59

## 2017-05-17 ENCOUNTER — Encounter (HOSPITAL_COMMUNITY): Payer: 59

## 2017-05-17 DIAGNOSIS — Z630 Problems in relationship with spouse or partner: Secondary | ICD-10-CM | POA: Diagnosis not present

## 2017-05-17 DIAGNOSIS — Z569 Unspecified problems related to employment: Secondary | ICD-10-CM | POA: Diagnosis not present

## 2017-05-19 ENCOUNTER — Encounter (HOSPITAL_COMMUNITY): Payer: 59

## 2017-05-24 ENCOUNTER — Encounter (HOSPITAL_COMMUNITY): Payer: 59

## 2017-05-25 DIAGNOSIS — Z630 Problems in relationship with spouse or partner: Secondary | ICD-10-CM | POA: Diagnosis not present

## 2017-05-25 DIAGNOSIS — Z569 Unspecified problems related to employment: Secondary | ICD-10-CM | POA: Diagnosis not present

## 2017-05-26 ENCOUNTER — Encounter (HOSPITAL_COMMUNITY): Payer: 59

## 2017-05-31 ENCOUNTER — Encounter (HOSPITAL_COMMUNITY): Payer: 59

## 2017-06-02 ENCOUNTER — Encounter (HOSPITAL_COMMUNITY): Payer: 59

## 2017-06-06 DIAGNOSIS — Z630 Problems in relationship with spouse or partner: Secondary | ICD-10-CM | POA: Diagnosis not present

## 2017-06-06 DIAGNOSIS — Z569 Unspecified problems related to employment: Secondary | ICD-10-CM | POA: Diagnosis not present

## 2017-06-07 ENCOUNTER — Encounter (HOSPITAL_COMMUNITY): Payer: 59

## 2017-06-09 ENCOUNTER — Encounter (HOSPITAL_COMMUNITY): Payer: 59

## 2017-06-14 ENCOUNTER — Encounter (HOSPITAL_COMMUNITY): Payer: 59

## 2017-06-16 ENCOUNTER — Encounter (HOSPITAL_COMMUNITY): Payer: 59

## 2017-06-20 DIAGNOSIS — Z630 Problems in relationship with spouse or partner: Secondary | ICD-10-CM | POA: Diagnosis not present

## 2017-06-21 ENCOUNTER — Encounter (HOSPITAL_COMMUNITY): Payer: 59

## 2017-06-23 ENCOUNTER — Encounter (HOSPITAL_COMMUNITY): Payer: 59

## 2017-06-28 ENCOUNTER — Encounter (HOSPITAL_COMMUNITY): Payer: 59

## 2017-06-30 ENCOUNTER — Encounter (HOSPITAL_COMMUNITY): Payer: 59

## 2017-07-05 ENCOUNTER — Encounter (HOSPITAL_COMMUNITY): Payer: 59

## 2017-07-13 ENCOUNTER — Other Ambulatory Visit: Payer: Self-pay | Admitting: Physician Assistant

## 2017-07-13 ENCOUNTER — Other Ambulatory Visit: Payer: Self-pay | Admitting: Family Medicine

## 2017-07-13 DIAGNOSIS — F324 Major depressive disorder, single episode, in partial remission: Secondary | ICD-10-CM

## 2017-07-26 ENCOUNTER — Ambulatory Visit (INDEPENDENT_AMBULATORY_CARE_PROVIDER_SITE_OTHER): Payer: 59 | Admitting: Family Medicine

## 2017-07-26 ENCOUNTER — Encounter: Payer: Self-pay | Admitting: Family Medicine

## 2017-07-26 VITALS — BP 110/72 | HR 75 | Temp 98.2°F | Ht 67.0 in | Wt 175.5 lb

## 2017-07-26 DIAGNOSIS — I10 Essential (primary) hypertension: Secondary | ICD-10-CM | POA: Diagnosis not present

## 2017-07-26 DIAGNOSIS — Z23 Encounter for immunization: Secondary | ICD-10-CM | POA: Diagnosis not present

## 2017-07-26 DIAGNOSIS — F324 Major depressive disorder, single episode, in partial remission: Secondary | ICD-10-CM

## 2017-07-26 MED ORDER — LISINOPRIL 20 MG PO TABS: 20.0000 mg | ORAL_TABLET | Freq: Every day | ORAL | 11 refills | Status: DC

## 2017-07-26 MED ORDER — VORTIOXETINE HBR 20 MG PO TABS: 20.0000 mg | ORAL_TABLET | Freq: Every day | ORAL | 11 refills | Status: DC

## 2017-07-26 NOTE — Patient Instructions (Addendum)
OK to go to dentist.  Hold off on donating blood until you come of the Plavix.  OK to check BP when you wish.  Aim to do some physical exertion for 150 minutes per week. This is typically divided into 5 days per week, 30 minutes per day. The activity should be enough to get your heart rate up. Anything is better than nothing if you have time constraints.  Healthy Eating Plan Many factors influence your heart health, including eating and exercise habits. Heart (coronary) risk increases with abnormal blood fat (lipid) levels. Heart-healthy meal planning includes limiting unhealthy fats, increasing healthy fats, and making other small dietary changes. This includes maintaining a healthy body weight to help keep lipid levels within a normal range.  WHAT IS MY PLAN?  Your health care provider recommends that you:  Drink a glass of water before meals to help with satiety.  Eat slowly.  An alternative to the water is to add Metamucil. This will help with satiety as well. It does contain calories, unlike water.  WHAT TYPES OF FAT SHOULD I CHOOSE?  Choose healthy fats more often. Choose monounsaturated and polyunsaturated fats, such as olive oil and canola oil, flaxseeds, walnuts, almonds, and seeds.  Eat more omega-3 fats. Good choices include salmon, mackerel, sardines, tuna, flaxseed oil, and ground flaxseeds. Aim to eat fish at least two times each week.  Avoid foods with partially hydrogenated oils in them. These contain trans fats. Examples of foods that contain trans fats are stick margarine, some tub margarines, cookies, crackers, and other baked goods. If you are going to avoid a fat, this is the one to avoid!  WHAT GENERAL GUIDELINES DO I NEED TO FOLLOW?  Check food labels carefully to identify foods with trans fats. Avoid these types of options when possible.  Fill one half of your plate with vegetables and green salads. Eat 4-5 servings of vegetables per day. A serving of  vegetables equals 1 cup of raw leafy vegetables,  cup of raw or cooked cut-up vegetables, or  cup of vegetable juice.  Fill one fourth of your plate with whole grains. Look for the word "whole" as the first word in the ingredient list.  Fill one fourth of your plate with lean protein foods.  Eat 4-5 servings of fruit per day. A serving of fruit equals one medium whole fruit,  cup of dried fruit,  cup of fresh, frozen, or canned fruit. Try to avoid fruits in cups/syrups as the sugar content can be high.  Eat more foods that contain soluble fiber. Examples of foods that contain this type of fiber are apples, broccoli, carrots, beans, peas, and barley. Aim to get 20-30 g of fiber per day.  Eat more home-cooked food and less restaurant, buffet, and fast food.  Limit or avoid alcohol.  Limit foods that are high in starch and sugar.  Avoid fried foods when able.  Cook foods by using methods other than frying. Baking, boiling, grilling, and broiling are all great options. Other fat-reducing suggestions include: ? Removing the skin from poultry. ? Removing all visible fats from meats. ? Skimming the fat off of stews, soups, and gravies before serving them. ? Steaming vegetables in water or broth.  Lose weight if you are overweight. Losing just 5-10% of your initial body weight can help your overall health and prevent diseases such as diabetes and heart disease.  Increase your consumption of nuts, legumes, and seeds to 4-5 servings per week. One serving of  dried beans or legumes equals  cup after being cooked, one serving of nuts equals 1 ounces, and one serving of seeds equals  ounce or 1 tablespoon.  WHAT ARE GOOD FOODS CAN I EAT? Grains Grainy breads (try to find bread that is 3 g of fiber per slice or greater), oatmeal, light popcorn. Whole-grain cereals. Rice and pasta, including brown rice and those that are made with whole wheat. Edamame pasta is a great alternative to grain pasta.  It has a higher protein content. Try to avoid significant consumption of white bread, sugary cereals, or pastries/baked goods.  Vegetables All vegetables. Cooked white potatoes do not count as vegetables.  Fruits All fruits, but limit pineapple and bananas as these fruits have a higher sugar content.  Meats and Other Protein Sources Lean, well-trimmed beef, veal, pork, and lamb. Chicken and [place] without skin. All fish and shellfish. Wild duck, rabbit, pheasant, and venison. Egg whites or low-cholesterol egg substitutes. Dried beans, peas, lentils, and tofu.Seeds and most nuts.  Dairy Low-fat or nonfat cheeses, including ricotta, string, and mozzarella. Skim or 1% milk that is liquid, powdered, or evaporated. Buttermilk that is made with low-fat milk. Nonfat or low-fat yogurt. Soy/Almond milk are good alternatives if you cannot handle dairy.  Beverages Water is the best for you. Sports drinks with less sugar are more desirable unless you are a highly active athlete.  Sweets and Desserts Sherbets and fruit ices. Honey, jam, marmalade, jelly, and syrups. Dark chocolate.  Eat all sweets and desserts in moderation.  Fats and Oils Nonhydrogenated (trans-free) margarines. Vegetable oils, including soybean, sesame, sunflower, olive, peanut, safflower, corn, canola, and cottonseed. Salad dressings or mayonnaise that are made with a vegetable oil. Limit added fats and oils that you use for cooking, baking, salads, and as spreads.  Other Cocoa powder. Coffee and tea. Most condiments.  The items listed above may not be a complete list of recommended foods or beverages. Contact your dietitian for more options.  j

## 2017-07-26 NOTE — Progress Notes (Signed)
Chief Complaint  Patient presents with  . Follow-up    Subjective Jordan Hendricks is a 56 y.o. male who presents for hypertension follow up. He does not monitor home blood pressures. He is compliant with medications. Lisinopril 20 mg daily.  Patient has these side effects of medication: none He is adhering to a healthy diet overall, could be healthier. Current exercise: not much now; sometimes will walk  Depression Currently on Tintrellix 20 mg daily, reports compliance.  No side effects.  No self medication.  He does follow with a Veterinary surgeoncounselor.   He had a stent placed earlier in the year due to 95% blockage.  He is currently on dual antiplatelet therapy.  He is wondering if he is okay to go back to the dentist.  He is also wondering if he is okay to donate blood again.   Past Medical History:  Diagnosis Date  . Anginal pain (HCC)   . Coronary artery disease   . Depression   . Essential hypertension 07/13/2016  . Family history of adverse reaction to anesthesia    " My Dad had bad reactions ,He would wake up & have hallucinations"  . GERD (gastroesophageal reflux disease)   . History of chicken pox   . History of colon polyps   . Hyperlipidemia    Family History  Problem Relation Age of Onset  . Heart disease Father   . Prostate cancer Father   . Alzheimer's disease Father   . Diabetes Father   . Hypertension Father   . Diabetes Maternal Grandmother   . Alzheimer's disease Paternal Grandmother    Medications Current Outpatient Medications on File Prior to Visit  Medication Sig Dispense Refill  . acetaminophen (TYLENOL) 325 MG tablet Take 650 mg by mouth every 6 (six) hours as needed for headache.    Marland Kitchen. aspirin EC 81 MG tablet Take 1 tablet (81 mg total) by mouth daily. 30 tablet 11  . Cholecalciferol (VITAMIN D3) 2000 units TABS Take 1 tablet by mouth daily.    . clopidogrel (PLAVIX) 75 MG tablet TAKE 1 TABLET BY MOUTH DAILY WITH BREAKFAST. 90 tablet 1  . isosorbide  mononitrate (IMDUR) 30 MG 24 hr tablet Take 0.5 tablets (15 mg total) by mouth daily. 15 tablet 11  . lisinopril (PRINIVIL,ZESTRIL) 20 MG tablet Take 1 tablet (20 mg total) by mouth daily. 90 tablet 1  . metoprolol succinate (TOPROL-XL) 25 MG 24 hr tablet Take 1 tablet (25 mg total) by mouth daily. 30 tablet 11  . Multiple Vitamins-Minerals (MULTIVITAMIN ADULT PO) Take 1 tablet by mouth daily.    . nitroGLYCERIN (NITROSTAT) 0.4 MG SL tablet Place 1 tablet (0.4 mg total) under the tongue every 5 (five) minutes as needed for chest pain. 25 tablet prn  . ranitidine (ZANTAC) 150 MG capsule Take 150 mg by mouth 2 (two) times daily.    . TRINTELLIX 20 MG TABS TAKE ONE TABLET BY MOUTH DAILY. 30 tablet 2  . rosuvastatin (CRESTOR) 20 MG tablet Take 1 tablet (20 mg total) by mouth daily. (Patient taking differently: Take 20 mg by mouth every evening. ) 30 tablet 11   Allergies Allergies  Allergen Reactions  . Lactose Intolerance (Gi)   . Tape Rash    Plastic tape    Review of Systems Cardiovascular: no chest pain Respiratory:  no shortness of breath  Exam BP 110/72 (BP Location: Left Arm, Patient Position: Sitting, Cuff Size: Normal)   Pulse 75   Temp 98.2 F (36.8 C) (  Oral)   Ht 5\' 7"  (1.702 m)   Wt 175 lb 8 oz (79.6 kg)   SpO2 97%   BMI 27.49 kg/m  General:  well developed, well nourished, in no apparent distress Skin: warm, no pallor or diaphoresis Eyes: pupils equal and round, sclera anicteric without injection Heart: RRR, no bruits, no LE edema Lungs: clear to auscultation, no accessory muscle use Psych: well oriented with normal range of affect and appropriate judgment/insight  Need for influenza vaccination - Plan: Flu Vaccine QUAD 6+ mos PF IM (Fluarix Quad PF)  Orders as above. Counseled on diet and exercise- he knows where he needs to do. OK to return to dentist. Hold off on donating blood until 1 yr.  F/u in in 6 mo for CPE. The patient voiced understanding and agreement  to the plan.  Greater than 25 minutes were spent face to face with the patient with greater than 50% of this time spent counseling on blood pressure monitoring, easy bruising, depression, dentists, and blood donation.    Jilda Rocheicholas Paul CassodayWendling, DO 07/26/17  7:11 AM

## 2017-07-26 NOTE — Progress Notes (Signed)
Pre visit review using our clinic review tool, if applicable. No additional management support is needed unless otherwise documented below in the visit note. 

## 2017-08-22 DIAGNOSIS — Z569 Unspecified problems related to employment: Secondary | ICD-10-CM | POA: Diagnosis not present

## 2017-08-22 DIAGNOSIS — Z630 Problems in relationship with spouse or partner: Secondary | ICD-10-CM | POA: Diagnosis not present

## 2017-08-30 ENCOUNTER — Encounter: Payer: Self-pay | Admitting: Cardiology

## 2017-08-30 ENCOUNTER — Ambulatory Visit (INDEPENDENT_AMBULATORY_CARE_PROVIDER_SITE_OTHER): Payer: 59 | Admitting: Cardiology

## 2017-08-30 VITALS — BP 110/80 | HR 78 | Ht 67.0 in | Wt 178.0 lb

## 2017-08-30 DIAGNOSIS — I2510 Atherosclerotic heart disease of native coronary artery without angina pectoris: Secondary | ICD-10-CM

## 2017-08-30 DIAGNOSIS — E7800 Pure hypercholesterolemia, unspecified: Secondary | ICD-10-CM

## 2017-08-30 DIAGNOSIS — Z8249 Family history of ischemic heart disease and other diseases of the circulatory system: Secondary | ICD-10-CM | POA: Diagnosis not present

## 2017-08-30 DIAGNOSIS — I208 Other forms of angina pectoris: Secondary | ICD-10-CM | POA: Diagnosis not present

## 2017-08-30 DIAGNOSIS — I10 Essential (primary) hypertension: Secondary | ICD-10-CM

## 2017-08-30 MED ORDER — NITROGLYCERIN 0.4 MG SL SUBL: 0.4000 mg | SUBLINGUAL_TABLET | SUBLINGUAL | 99 refills | Status: DC | PRN

## 2017-08-30 MED ORDER — LISINOPRIL 20 MG PO TABS: 20.0000 mg | ORAL_TABLET | Freq: Every day | ORAL | 11 refills | Status: DC

## 2017-08-30 MED ORDER — ISOSORBIDE MONONITRATE ER 30 MG PO TB24: 15.0000 mg | ORAL_TABLET | Freq: Every day | ORAL | 11 refills | Status: DC

## 2017-08-30 MED ORDER — METOPROLOL SUCCINATE ER 25 MG PO TB24: 25.0000 mg | ORAL_TABLET | Freq: Every day | ORAL | 11 refills | Status: DC

## 2017-08-30 NOTE — Addendum Note (Signed)
Addended by: Sharin GraveFLEMING, Coolidge Gossard J on: 08/30/2017 04:48 PM   Modules accepted: Orders

## 2017-08-30 NOTE — Patient Instructions (Signed)
Medication Instructions:  Continue current medications as listed. You may discontinue your Plavix after 10/07/2017.  Follow-Up: Follow up in 1 year with Dr. Anne FuSkains.  You will receive a letter in the mail 2 months before you are due.  Please call us when you receive this letter to schedule your follow up appointment.  If you need a refill on your cardiac medications before your next appointment, please call your pharmacy.  Thank you for choosing [facility]!!

## 2017-08-30 NOTE — Progress Notes (Signed)
Cardiology Office Note    Date:  08/30/2017   ID:  Sanda Klein, DOB [birth year], MRN [id number]  PCP:  Sharlene Dory, DO  Cardiologist:   Donato Schultz, MD     History of Present Illness:  Jordan Hendricks is a 56 y.o. male here for Follow-up of coronary artery disease with proximal RCA stent placed 10/07/16.   Overall he is doing very well postop placement. No chest pain, no shortness of breath, no fevers chills. He does occasionally have a cough, he was on lisinopril for quite some time prior. Mild knot right above arteriotomy site radial. Mild ecchymosis noted. Overall excellent pulses.  Review of his previous office " note on 08/24/16 he was feeling dyspnea on exertion, central chest tightness, jaw pain. 6 months duration. He is nervous about this, moderate in severity. 3 flts of stairs, aches in teeth and jaw. Ran marathon in 2007. Stopped prior Crestor use Bactroban as well. Shoulder 2015 surgery hurt soccer.  He has a history of hypertension, hyperlipidemia and heart disease with his father, age 31 had CABG. Nonsmoker, nondiabetic."  He's also been bothered with GERD. Moved from [place] in July 2017.   08/30/17 -overall doing quite well.  No chest pain, no shortness of breath.  No jaw pain as he was feeling previously as anginal equivalent.  Taking all medications as prescribed.  He states that he has not been doing quite as well with his exercise.  He has not joined a gym.  He enjoyed his 24 sessions of cardiac rehab.  Finished up in [place].  He lives now in [place].  No syncope.  He did hit his night dresser at night in darkness and developed a hematoma.  This is improved.  Occasionally he will get twinges of chest discomfort both right and left, atypical.    Past Medical History:  Diagnosis Date  . Anginal pain (HCC)   . Coronary artery disease   . Depression   . Essential hypertension 07/13/2016  . Family history of adverse reaction to anesthesia    " My Dad had bad reactions ,He would wake up & have hallucinations"  . GERD (gastroesophageal reflux disease)   . History of chicken pox   . History of colon polyps   . Hyperlipidemia     Past Surgical History:  Procedure Laterality Date  . CARDIAC CATHETERIZATION  10/07/2016  . CARDIAC CATHETERIZATION N/A 10/07/2016   Procedure: Left Heart Cath and Coronary Angiography;  Surgeon: Yvonne Kendall, MD;  Location: [facility] INVASIVE CV LAB;  Service: Cardiovascular;  Laterality: N/A;  . CARDIAC CATHETERIZATION N/A 10/07/2016   Procedure: Coronary Stent Intervention;  Surgeon: Yvonne Kendall, MD;  Location: [facility] INVASIVE CV LAB;  Service: Cardiovascular;  Laterality: N/A;  RCA  . PERCUTANEOUS CORONARY STENT INTERVENTION (PCI-S)  10/07/2016   RCA  . REFRACTIVE SURGERY Bilateral   . ROTATOR CUFF REPAIR Right 2015  . WISDOM TOOTH EXTRACTION      Current Medications: Outpatient Medications Prior to Visit  Medication Sig Dispense Refill  . acetaminophen (TYLENOL) 325 MG tablet Take 650 mg by mouth every 6 (six) hours as needed for headache.    Marland Kitchen aspirin EC 81 MG tablet Take 1 tablet (81 mg total) by mouth daily. 30 tablet 11  . Cholecalciferol (VITAMIN D3) 2000 units TABS Take 1 tablet by mouth daily.    . clopidogrel (PLAVIX) 75 MG tablet TAKE 1 TABLET BY MOUTH DAILY WITH BREAKFAST. 90 tablet 1  . isosorbide mononitrate (IMDUR) 30  MG 24 hr tablet Take 0.5 tablets (15 mg total) by mouth daily. 15 tablet 11  . lisinopril (PRINIVIL,ZESTRIL) 20 MG tablet Take 1 tablet (20 mg total) daily by mouth. 30 tablet 11  . metoprolol succinate (TOPROL-XL) 25 MG 24 hr tablet Take 1 tablet (25 mg total) by mouth daily. 30 tablet 11  . Multiple Vitamins-Minerals (MULTIVITAMIN ADULT PO) Take 1 tablet by mouth daily.    . nitroGLYCERIN (NITROSTAT) 0.4 MG SL tablet Place 1 tablet (0.4 mg total) under the tongue every 5 (five) minutes as needed for chest pain. 25 tablet prn  . ranitidine (ZANTAC) 150 MG capsule Take 150  mg by mouth 2 (two) times daily.    Marland Kitchen. vortioxetine HBr (TRINTELLIX) 20 MG TABS Take 20 mg daily by mouth. 30 tablet 11  . rosuvastatin (CRESTOR) 20 MG tablet Take 1 tablet (20 mg total) by mouth daily. (Patient taking differently: Take 20 mg by mouth every evening. ) 30 tablet 11   No facility-administered medications prior to visit.      Allergies:   Lactose intolerance (gi) and Tape   Social History   Socioeconomic History  . Marital status: Married    Spouse name: None  . Number of children: None  . Years of education: None  . Highest education level: None  Social Needs  . Financial resource strain: None  . Food insecurity - worry: None  . Food insecurity - inability: None  . Transportation needs - medical: None  . Transportation needs - non-medical: None  Occupational History  . None  Tobacco Use  . Smoking status: Never Smoker  . Smokeless tobacco: Never Used  Substance and Sexual Activity  . Alcohol use: Yes    Comment: social  . Drug use: No  . Sexual activity: None  Other Topics Concern  . None  Social History Narrative  . None     Family History:  The patient's family history includes Alzheimer's disease in his father and paternal grandmother; Diabetes in his father and maternal grandmother; Heart disease in his father; Hypertension in his father; Prostate cancer in his father.   ROS:   Please see the history of present illness.    ROS All other systems reviewed and are negative.   PHYSICAL EXAM:   VS:  BP 110/80   Pulse 78   Ht 5\' 7"  (1.702 m)   Wt 178 lb (80.7 kg)   SpO2 96%   BMI 27.88 kg/m    GEN: Well nourished, well developed, in no acute distress  HEENT: normal  Neck: no JVD, carotid bruits, or masses Cardiac: RRR; no murmurs, rubs, or gallops,no edema  Respiratory:  clear to auscultation bilaterally, normal work of breathing GI: soft, nontender, nondistended, + BS MS: no deformity or atrophy  Skin: warm and dry, no rash Neuro:  Alert and  Oriented x 3, Strength and sensation are intact Psych: euthymic mood, full affect   Wt Readings from Last 3 Encounters:  08/30/17 178 lb (80.7 kg)  07/26/17 175 lb 8 oz (79.6 kg)  03/15/17 172 lb 12.8 oz (78.4 kg)      Studies/Labs Reviewed:   EKG: Today 08/30/17-sinus rhythm 78 with no other abnormalities heart rate 78 bpm.  Personally viewed 08/24/16-sinus rhythm with deeply inverted T waves in the inferior leads as well as lateral leads. Change from prior EKG.  Recent Labs: 10/08/2016: BUN 10; Creatinine, Ser 1.09; Hemoglobin 14.2; Platelets 226; Potassium 4.7; Sodium 141   Lipid Panel  Component Value Date/Time   CHOL 130 01/13/2017 0827   TRIG 126.0 01/13/2017 0827   HDL 45.80 01/13/2017 0827   CHOLHDL 3 01/13/2017 0827   VLDL 25.2 01/13/2017 0827   LDLCALC 59 01/13/2017 0827   LDLDIRECT 198.0 07/13/2016 0808    Additional studies/ records that were reviewed today include:  Prior office notes, lab work, EKG reviewed.  10/07/16: Cath Conclusion   FINDINGS: 1. 95% proximal RCA stenosis with competitive filling of the rPL branches via the LCx. 2. Mild to moderate, non-obstructive CAD involving the left coronary artery. 3. Pressure dampening in the LMCA and ostial RCA with catheter engagement that improved after intracoronary nitroglycerin consistent with vasospasm. 4. Normal left ventricular filling pressure. 5. Normal left ventricular contraction. 6. Successful PCI to proximal RCA with placement of Promus Premier 3.0 x 16 mm drug-eluting stent post-dilated with 3.5 mm [place] balloon.  RECOMMENDATIONS: 1. Admit for overnight observation following PCI given significant vasospasm. 2. Patient loaded with prasugrel in the cath lab and will continue clopidogrel 75 mg daily for at least 6 months, ideally longer. 3. Aggressive secondary prevention and medical therapy for component of vasospasm.  Yvonne Kendallhristopher End, MD     ASSESSMENT:    1. Essential hypertension   2.  Coronary artery disease involving native coronary artery of native heart without angina pectoris   3. Angina decubitus (HCC)   4. Pure hypercholesterolemia   5. Family history of early CAD      PLAN:  In order of problems listed above:  Coronary artery disease status post proximal RCA stent, 10/07/16  - Doing very well, markedly improved symptoms. Walking now 15 minutes on the treadmill 1.8 miles per hour. No chest pain, no shortness of breath.  - Previously had T-wave inversions on EKG in the inferior lateral leads, change from prior, chest tightness, dyspnea on exertion and teeth/jaw pain as well as LDL of 198 and father who had bypass surgery in his 7750s  - His wife [name] has had one as well.  - Toprol-XL, continue with lisinopril, low-dose isosorbide  - Aspirin 81 mg, Plavix for 1 year, can stop on 10/07/17  - Nitroglycerin when necessary  - Cardiac rehabilitation 24 sessions, diet.  Encourage exercise.   Hyperlipidemia  - Started back Crestor. He took 10 years ago.  Essential hypertension  - Toprol-XL 25., Already on lisinopril 20.  GERD  - Continuing with ranitidine.  Angina -Well-controlled with low-dose isosorbide and metoprolol.  I think would be reasonable to try to pull off the isosorbide next.  1 year follow up.  Medication Adjustments/Labs and Tests Ordered: Current medicines are reviewed at length with the patient today.  Concerns regarding medicines are outlined above.  Medication changes, Labs and Tests ordered today are listed in the Patient Instructions below. Patient Instructions  Medication Instructions:  Continue current medications as listed. You may discontinue your Plavix after 10/07/2017.  Follow-Up: Follow up in 1 year with Dr. Anne FuSkains.  You will receive a letter in the mail 2 months before you are due.  Please call us when you receive this letter to schedule your follow up appointment.  If you need a refill on your cardiac medications before your  next appointment, please call your pharmacy.  [facility] you [facility] choosing [facility]!!        Signed, Donato SchultzMark Romonia Yanik, MD  08/30/2017 4:45 PM    [facility] [address], [place], [place]  [address] Phone: [phone]; Fax: [phone]

## 2017-09-04 ENCOUNTER — Other Ambulatory Visit: Payer: Self-pay | Admitting: Cardiology

## 2017-09-12 ENCOUNTER — Other Ambulatory Visit: Payer: Self-pay

## 2017-09-12 ENCOUNTER — Emergency Department (HOSPITAL_BASED_OUTPATIENT_CLINIC_OR_DEPARTMENT_OTHER)
Admission: EM | Admit: 2017-09-12 | Discharge: 2017-09-12 | Disposition: A | Discharge status: Home / Self Care | Payer: 59 | Attending: Emergency Medicine | Admitting: Emergency Medicine

## 2017-09-12 ENCOUNTER — Encounter (HOSPITAL_BASED_OUTPATIENT_CLINIC_OR_DEPARTMENT_OTHER): Payer: Self-pay | Admitting: Emergency Medicine

## 2017-09-12 DIAGNOSIS — Z7982 Long term (current) use of aspirin: Secondary | ICD-10-CM | POA: Diagnosis not present

## 2017-09-12 DIAGNOSIS — Z79899 Other long term (current) drug therapy: Secondary | ICD-10-CM | POA: Diagnosis not present

## 2017-09-12 DIAGNOSIS — I10 Essential (primary) hypertension: Secondary | ICD-10-CM | POA: Diagnosis not present

## 2017-09-12 DIAGNOSIS — B029 Zoster without complications: Secondary | ICD-10-CM | POA: Insufficient documentation

## 2017-09-12 DIAGNOSIS — R51 Headache: Secondary | ICD-10-CM | POA: Diagnosis present

## 2017-09-12 MED ORDER — VALACYCLOVIR HCL 500 MG PO TABS
1000.0000 mg | ORAL_TABLET | Freq: Once | ORAL | Status: AC
  Administered 2017-09-12: 1000 mg via ORAL
  Filled 2017-09-12: qty 2

## 2017-09-12 MED ORDER — VALACYCLOVIR HCL 1 G PO TABS: 1000.0000 mg | ORAL_TABLET | Freq: Three times a day (TID) | ORAL | 0 refills | Status: AC

## 2017-09-12 NOTE — Discharge Instructions (Signed)
Take valtrex with food. Tylenol for pan.

## 2017-09-12 NOTE — ED Provider Notes (Signed)
[facility] EMERGENCY DEPARTMENT Provider Note   CSN: [id number] Arrival date & time: 09/12/17  1409     History   Chief Complaint Chief Complaint  Patient presents with  . Rash    HPI Jordan Hendricks is a 56 y.o. male. Chief complaint is headache and rash.  HPI 56 year old male. He has had a right-sided headache for about 3 days. He noticed a rash behind his ear on his right side of his scalp and is concerned he may have shingles. No fever. No vomiting. No vision changes. No other symptoms.  Past Medical History:  Diagnosis Date  . Anginal pain (HCC)   . Coronary artery disease   . Depression   . Essential hypertension 07/13/2016  . Family history of adverse reaction to anesthesia    " My Dad had bad reactions ,He would wake up & have hallucinations"  . GERD (gastroesophageal reflux disease)   . History of chicken pox   . History of colon polyps   . Hyperlipidemia     Patient Active Problem List   Diagnosis Date Noted  . Chest pain 10/07/2016  . Stable angina (HCC) 10/07/2016  . Angina decubitus (HCC) 09/30/2016  . Family history of early CAD 09/30/2016  . Pure hypercholesterolemia 09/30/2016  . Essential hypertension 07/13/2016  . Depression 07/13/2016    Past Surgical History:  Procedure Laterality Date  . CARDIAC CATHETERIZATION  10/07/2016  . CARDIAC CATHETERIZATION N/A 10/07/2016   Procedure: Left Heart Cath and Coronary Angiography;  Surgeon: Yvonne Kendall, MD;  Location: [facility] INVASIVE CV LAB;  Service: Cardiovascular;  Laterality: N/A;  . CARDIAC CATHETERIZATION N/A 10/07/2016   Procedure: Coronary Stent Intervention;  Surgeon: Yvonne Kendall, MD;  Location: [facility] INVASIVE CV LAB;  Service: Cardiovascular;  Laterality: N/A;  RCA  . PERCUTANEOUS CORONARY STENT INTERVENTION (PCI-S)  10/07/2016   RCA  . REFRACTIVE SURGERY Bilateral   . ROTATOR CUFF REPAIR Right 2015  . WISDOM TOOTH EXTRACTION         Home Medications    Prior to  Admission medications   Medication Sig Start Date End Date Taking? Authorizing Provider  acetaminophen (TYLENOL) 325 MG tablet Take 650 mg by mouth every 6 (six) hours as needed for headache.    [provider]  aspirin EC 81 MG tablet Take 1 tablet (81 mg total) by mouth daily. 09/30/16   Jake Bathe, MD  Cholecalciferol (VITAMIN D3) 2000 units TABS Take 1 tablet by mouth daily.    [provider]  clopidogrel (PLAVIX) 75 MG tablet TAKE 1 TABLET BY MOUTH DAILY WITH BREAKFAST. 07/13/17   Bhagat, Sharrell Ku, PA  isosorbide mononitrate (IMDUR) 30 MG 24 hr tablet Take 0.5 tablets (15 mg total) by mouth daily. 08/30/17   Jake Bathe, MD  lisinopril (PRINIVIL,ZESTRIL) 20 MG tablet Take 1 tablet (20 mg total) by mouth daily. 08/30/17   Jake Bathe, MD  metoprolol succinate (TOPROL-XL) 25 MG 24 hr tablet Take 1 tablet (25 mg total) by mouth daily. 08/30/17   Jake Bathe, MD  Multiple Vitamins-Minerals (MULTIVITAMIN ADULT PO) Take 1 tablet by mouth daily.    [provider]  nitroGLYCERIN (NITROSTAT) 0.4 MG SL tablet Place 1 tablet (0.4 mg total) under the tongue every 5 (five) minutes as needed for chest pain. 08/30/17   Jake Bathe, MD  ranitidine (ZANTAC) 150 MG capsule Take 150 mg by mouth 2 (two) times daily.    [provider]  valACYclovir (VALTREX) 1000 MG tablet  Take 1 tablet (1,000 mg total) by mouth 3 (three) times daily for 7 days. 09/12/17 09/19/17  Rolland PorterJames, Arihana Ambrocio, MD  vortioxetine HBr (TRINTELLIX) 20 MG TABS Take 20 mg daily by mouth. 07/26/17   Sharlene DoryWendling, Nicholas Paul, DO    Family History Family History  Problem Relation Age of Onset  . Heart disease Father   . Prostate cancer Father   . Alzheimer's disease Father   . Diabetes Father   . Hypertension Father   . Diabetes Maternal Grandmother   . Alzheimer's disease Paternal Grandmother     Social History Social History   Tobacco Use  . Smoking status: Never Smoker  . Smokeless  tobacco: Never Used  Substance Use Topics  . Alcohol use: Yes    Comment: social  . Drug use: No     Allergies   Lactose intolerance (gi) and Tape   Review of Systems Review of Systems  Constitutional: Negative for appetite change, chills, diaphoresis, fatigue and fever.  HENT: Negative for mouth sores, sore throat and trouble swallowing.   Eyes: Negative for visual disturbance.  Respiratory: Negative for cough, chest tightness, shortness of breath and wheezing.   Cardiovascular: Negative for chest pain.  Gastrointestinal: Negative for abdominal distention, abdominal pain, diarrhea, nausea and vomiting.  Endocrine: Negative for polydipsia, polyphagia and polyuria.  Genitourinary: Negative for dysuria, frequency and hematuria.  Musculoskeletal: Negative for gait problem.  Skin: Positive for rash. Negative for color change and pallor.  Neurological: Positive for headaches. Negative for dizziness, syncope and light-headedness.  Hematological: Does not bruise/bleed easily.  Psychiatric/Behavioral: Negative for behavioral problems and confusion.     Physical Exam Updated Vital Signs BP 121/76 (BP Location: Left Arm)   Pulse 75   Temp 97.9 F (36.6 C) (Oral)   Resp 18   Ht 5\' 7"  (1.702 m)   Wt 78 kg (172 lb)   SpO2 100%   BMI 26.94 kg/m   Physical Exam  Constitutional: He is oriented to person, place, and time. He appears well-developed and well-nourished. No distress.  HENT:  Head: Normocephalic.  Tenderness to right scalp and early vesicular lesions on erythematous base posteriorly urine in the right temporoparietal scalp.  Eyes: Conjunctivae are normal. Pupils are equal, round, and reactive to light. No scleral icterus.  Neck: Normal range of motion. Neck supple. No thyromegaly present.  Cardiovascular: Normal rate and regular rhythm. Exam reveals no gallop and no friction rub.  No murmur heard. Pulmonary/Chest: Effort normal and breath sounds normal. No respiratory  distress. He has no wheezes. He has no rales.  Abdominal: Soft. Bowel sounds are normal. He exhibits no distension. There is no tenderness. There is no rebound.  Musculoskeletal: Normal range of motion.  Neurological: He is alert and oriented to person, place, and time.  Skin: Skin is warm and dry. No rash noted.  Psychiatric: He has a normal mood and affect. His behavior is normal.     ED Treatments / Results  Labs (all labs ordered are listed, but only abnormal results are displayed) Labs Reviewed - No data to display  EKG  EKG Interpretation None       Radiology No results found.  Procedures Procedures (including critical care time)  Medications Ordered in ED Medications  valACYclovir (VALTREX) tablet 1,000 mg (1,000 mg Oral Given 09/12/17 1540)     Initial Impression / Assessment and Plan / ED Course  I have reviewed the triage vital signs and the nursing notes.  Pertinent labs & imaging results  that were available during my care of the patient were reviewed by me and considered in my medical decision making (see chart for details).     Patient tolerating this well. Within 48-70 chart window. We'll initiate antivirals. Tylenol for pain. He declines any additional medication for his headache.  Final Clinical Impressions(s) / ED Diagnoses   Final diagnoses:  Herpes zoster without complication    ED Discharge Orders        Ordered    valACYclovir (VALTREX) 1000 MG tablet  3 times daily     09/12/17 1536       Rolland PorterJames, Niel Peretti, MD 09/12/17 1549

## 2017-09-12 NOTE — ED Notes (Signed)
Patient presents stating he thinks he has shingles.  Red raised area to the right side of his neck and redness with more raised areas noted up into the the right side of his scalp.  States sometimes it feels like an electric shock into his head.

## 2017-09-12 NOTE — ED Triage Notes (Signed)
Patient states " I think I have shingles" - the patient states that he has had pain to the right side of his head x 1 week and yesterday he started to have bumps and a rash to his right head and scalp

## 2017-09-14 ENCOUNTER — Telehealth: Payer: Self-pay | Admitting: Family Medicine

## 2017-09-14 NOTE — Telephone Encounter (Signed)
Pt's wife, Britta MccreedyBarbara, calling asking if pt was contagious and if he could go to a funeral on Saturday. Pt was seen in the ED on 12/25 and was told he had shingles. Pt's wife states that the areas of rash are not open or weeping. Explained to pt's wife that pt could be contagious if wounds were to open up and pt should still be careful with being in contact with others at this time. Pt's wife states he is at work at this time.

## 2017-09-15 ENCOUNTER — Ambulatory Visit: Payer: Self-pay

## 2017-09-15 ENCOUNTER — Ambulatory Visit (INDEPENDENT_AMBULATORY_CARE_PROVIDER_SITE_OTHER): Payer: 59 | Admitting: Family Medicine

## 2017-09-15 ENCOUNTER — Encounter: Payer: Self-pay | Admitting: Family Medicine

## 2017-09-15 VITALS — BP 134/95 | HR 90 | Temp 98.3°F | Resp 16 | Ht 67.0 in | Wt 180.8 lb

## 2017-09-15 DIAGNOSIS — B029 Zoster without complications: Secondary | ICD-10-CM | POA: Diagnosis not present

## 2017-09-15 MED ORDER — GABAPENTIN 400 MG PO CAPS: 400.0000 mg | ORAL_CAPSULE | Freq: Three times a day (TID) | ORAL | 0 refills | Status: DC

## 2017-09-15 NOTE — Patient Instructions (Addendum)
Do not take this medicine during the day if it makes you drowsy.   Let us know if anything changes.

## 2017-09-15 NOTE — Progress Notes (Signed)
Chief Complaint  Patient presents with  . Herpes Zoster    Pt states he needs something for pain     Jordan Hendricks is a 56 y.o. male here for a skin complaint.  Duration: 3 days Location: R scalp, neck and upper back Pruritic? Yes Painful? Yes- burning Drainage? No New soaps/lotions/topicals/detergents? No Seen by ED and placed on Valtrex, offered something for pain but he wasn't having any at the time.  ROS:  Const: No fevers Skin: As noted in HPI  Past Medical History:  Diagnosis Date  . Anginal pain (HCC)   . Coronary artery disease   . Depression   . Essential hypertension 07/13/2016  . Family history of adverse reaction to anesthesia    " My Dad had bad reactions ,He would wake up & have hallucinations"  . GERD (gastroesophageal reflux disease)   . History of chicken pox   . History of colon polyps   . Hyperlipidemia    BP (!) 134/95   Pulse 90   Temp 98.3 F (36.8 C) (Oral)   Resp 16   Ht 5\' 7"  (1.702 m)   Wt 180 lb 12.8 oz (82 kg)   SpO2 98%   BMI 28.32 kg/m  Gen: awake, alert, appearing stated age Lungs: No accessory muscle use Skin: See below. No drainage, fluctuance Psych: Age appropriate judgment and insight  Media Information    Media Information     Herpes zoster without complication - Plan: gabapentin (NEURONTIN) 400 MG capsule  Orders as above. F/u prn. The patient voiced understanding and agreement to the plan.  Jilda Rocheicholas Paul IraanWendling, DO 09/15/17 2:42 PM

## 2017-09-15 NOTE — Telephone Encounter (Signed)
Patient called in with c/o"pain from shingles." Patient reports going to the ED on 09/12/17 with a rash and was diagnosed with shingles, was started on Valtrex at that time and has been taking as prescribed. He says that initially he wasn't in pain and didn't get a prescription for pain medicine. He says the pain is starting to increase and Tylenol is not helping the pain now. He says he was unable to sleep last night, tossed and turned. He describes the rash as raised, red areas, non-draining and notes the number of areas have increased since he went to the ED, see the assessment below for the designated areas of the rash. He does report having a sore throat and the right ear feeling stopped up at times. He reports having the tingling sensation around his right eye, but says there is no rash. Protocol indicates seeing the PCP within 24 hours, office flow manager notified due to the block on the appointment of Dr. Carmelia RollerWendling, she got approval for an appointment for today, patient notified and agrees to appointment, care advice given to the patient, verbalizes understanding.  Reason for Disposition . SEVERE pain (e.g., excruciating)  Answer Assessment - Initial Assessment Questions 1. APPEARANCE of RASH: "Describe the rash."      Raised, red areas-not draining 2. LOCATION: "Where is the rash located?"      Scalp, behind right ear going down rt side of neck, places on back 3. ONSET: "When did the rash start?"      09/11/17-09/12/17-went to the ED and was started on Valtrex 4. ITCHING: "Does the rash itch?" If so, ask: "How bad is the itch?"  (Scale 1-10; or mild, moderate, severe)     Moderate 5. PAIN: "Does the rash hurt?" If so, ask: "How bad is the pain?"  (Scale 1-10; or mild, moderate, severe)     8 when it's painful to my head 6. OTHER SYMPTOMS: "Do you have any other symptoms?" (e.g., fever)     No fever, sore throat, right ear feels like it's stopped up, tingles around right eye, but no break  out. 7. PREGNANCY: "Is there any chance you are pregnant?" "When was your last menstrual period?"     N/A  Protocols used: [facility]

## 2017-09-16 ENCOUNTER — Other Ambulatory Visit: Payer: Self-pay

## 2017-09-16 ENCOUNTER — Encounter (HOSPITAL_BASED_OUTPATIENT_CLINIC_OR_DEPARTMENT_OTHER): Payer: Self-pay | Admitting: Emergency Medicine

## 2017-09-16 ENCOUNTER — Emergency Department (HOSPITAL_BASED_OUTPATIENT_CLINIC_OR_DEPARTMENT_OTHER)
Admission: EM | Admit: 2017-09-16 | Discharge: 2017-09-16 | Disposition: A | Discharge status: Home / Self Care | Payer: 59 | Attending: Emergency Medicine | Admitting: Emergency Medicine

## 2017-09-16 DIAGNOSIS — G510 Bell's palsy: Secondary | ICD-10-CM | POA: Diagnosis not present

## 2017-09-16 DIAGNOSIS — B028 Zoster with other complications: Secondary | ICD-10-CM | POA: Diagnosis not present

## 2017-09-16 DIAGNOSIS — R29810 Facial weakness: Secondary | ICD-10-CM | POA: Diagnosis present

## 2017-09-16 DIAGNOSIS — Z79899 Other long term (current) drug therapy: Secondary | ICD-10-CM | POA: Diagnosis not present

## 2017-09-16 DIAGNOSIS — I25118 Atherosclerotic heart disease of native coronary artery with other forms of angina pectoris: Secondary | ICD-10-CM | POA: Insufficient documentation

## 2017-09-16 DIAGNOSIS — Z7982 Long term (current) use of aspirin: Secondary | ICD-10-CM | POA: Diagnosis not present

## 2017-09-16 DIAGNOSIS — Z7902 Long term (current) use of antithrombotics/antiplatelets: Secondary | ICD-10-CM | POA: Insufficient documentation

## 2017-09-16 DIAGNOSIS — I10 Essential (primary) hypertension: Secondary | ICD-10-CM | POA: Insufficient documentation

## 2017-09-16 MED ORDER — OXYCODONE-ACETAMINOPHEN 5-325 MG PO TABS: 1.0000 | ORAL_TABLET | ORAL | 0 refills | Status: DC | PRN

## 2017-09-16 MED ORDER — PREDNISONE 10 MG (21) PO TBPK: ORAL_TABLET | Freq: Every day | ORAL | 0 refills | Status: DC

## 2017-09-16 MED ORDER — DOCUSATE SODIUM 100 MG PO CAPS: 100.0000 mg | ORAL_CAPSULE | Freq: Every day | ORAL | 0 refills | Status: AC

## 2017-09-16 NOTE — ED Notes (Addendum)
Pt given Rx x 3 (percocet, colace, prednisone)and d/c home with family

## 2017-09-16 NOTE — ED Triage Notes (Signed)
Pt currently have Shingles outbreak, seen here Christmas for it. Woke up today with right sided facial droop and severe pain behind right ear.

## 2017-09-16 NOTE — Discharge Instructions (Signed)
You were seen in the ED today with weakness in the face called Bell's Palsy. This will resolve with time. Take the steroid as directed and the Percocet for pain. Do not drive while taking Percocet. Tape the right eye closed at night and use moisturizing drops to prevent drying of the eye.   Return to the ED immediately with any weakness/numbness in the arms/legs, difficulty speaking, or vision changes.

## 2017-09-16 NOTE — ED Provider Notes (Signed)
Emergency Department Provider Note   I have reviewed the triage vital signs and the nursing notes.   HISTORY  Chief Complaint Facial Droop   HPI Jordan Hendricks is a 56 y.o. male with recent diagnosis of shingles on Valtrex and Gabapentin to the emergency department for evaluation of new onset right face droop and pain behind the right ear.  The patient has shingles rash in this area and has been treating it with Valtrex and newly prescribed gabapentin.  He has been having pain throughout the evenings and had difficulty sleeping last night despite taking his gabapentin.  This morning he noticed weakness in the right side of the face and difficulty closing the right eye.  He denies any numbness, tingling, weakness in the upper or lower extremities.  No difficulty walking.  Denies vision changes.  No ringing in the ears.  No fevers or chills.  Past Medical History:  Diagnosis Date  . Anginal pain (HCC)   . Coronary artery disease   . Depression   . Essential hypertension 07/13/2016  . Family history of adverse reaction to anesthesia    " My Dad had bad reactions ,He would wake up & have hallucinations"  . GERD (gastroesophageal reflux disease)   . History of chicken pox   . History of colon polyps   . Hyperlipidemia     Patient Active Problem List   Diagnosis Date Noted  . Chest pain 10/07/2016  . Stable angina (HCC) 10/07/2016  . Angina decubitus (HCC) 09/30/2016  . Family history of early CAD 09/30/2016  . Pure hypercholesterolemia 09/30/2016  . Essential hypertension 07/13/2016  . Depression 07/13/2016    Past Surgical History:  Procedure Laterality Date  . CARDIAC CATHETERIZATION  10/07/2016  . CARDIAC CATHETERIZATION N/A 10/07/2016   Procedure: Left Heart Cath and Coronary Angiography;  Surgeon: Yvonne Kendallhristopher End, MD;  [facility];  Service: Cardiovascular;  Laterality: N/A;  . CARDIAC CATHETERIZATION N/A 10/07/2016   Procedure: Coronary Stent  Intervention;  Surgeon: Yvonne Kendallhristopher End, MD;  Location: [facility] INVASIVE CV LAB;  Service: Cardiovascular;  Laterality: N/A;  RCA  . PERCUTANEOUS CORONARY STENT INTERVENTION (PCI-S)  10/07/2016   RCA  . REFRACTIVE SURGERY Bilateral   . ROTATOR CUFF REPAIR Right 2015  . WISDOM TOOTH EXTRACTION      Current Outpatient Rx  . Order #: [id number] Class: OTC  . Order #: [id number] Class: Historical Med  . Order #: [id number] Class: Normal  . Order #: [id number] Class: Normal  . Order #: [id number] Class: Normal  . Order #: [id number] Class: Normal  . Order #: [id number] Class: Normal  . Order #: [id number] Class: Historical Med  . Order #: [id number] Class: Historical Med  . Order #: [id number] Class: Print  . Order #: [id number] Class: Normal  . Order #: [id number] Class: Historical Med  . Order #: [id number] Class: Print  . Order #: [id number] Class: Normal  . Order #: [id number] Class: Print  . Order #: [id number] Class: Print    Allergies Lactose intolerance (gi) and Tape  Family History  Problem Relation Age of Onset  . Heart disease Father   . Prostate cancer Father   . Alzheimer's disease Father   . Diabetes Father   . Hypertension Father   . Diabetes Maternal Grandmother   . Alzheimer's disease Paternal Grandmother     Social History Social History   Tobacco Use  . Smoking status: Never Smoker  . Smokeless tobacco: Never Used  Substance Use Topics  . Alcohol use: Yes    Comment: social  . Drug use:  No    Review of Systems  Constitutional: No fever/chills Eyes: No visual changes. ENT: No sore throat. Cardiovascular: Denies chest pain. Respiratory: Denies shortness of breath. Gastrointestinal: No abdominal pain.  No nausea, no vomiting.  No diarrhea.  No constipation. Genitourinary: Negative for dysuria. Musculoskeletal: Negative for back pain. Skin: Positive for painful rash. Neurological: Negative for headaches, focal weakness or numbness. Positive right face weakness.   10-point ROS  otherwise negative.  ____________________________________________   PHYSICAL EXAM:  VITAL SIGNS: ED Triage Vitals  Enc Vitals Group     BP 09/16/17 1226 137/90     Pulse Rate 09/16/17 1226 74     Resp 09/16/17 1226 18     Temp 09/16/17 1226 98.3 F (36.8 C)     Temp Source 09/16/17 1226 Oral     SpO2 09/16/17 1226 98 %     Pain Score 09/16/17 1229 8   Constitutional: Alert and oriented. Well appearing and in no acute distress. Eyes: Conjunctivae are normal. PERRL. Head: Atraumatic. Ears:  Healthy appearing ear canals and TMs bilaterally Nose: No congestion/rhinnorhea. Mouth/Throat: Mucous membranes are moist.  Oropharynx non-erythematous. Neck: No stridor.  Cardiovascular: Normal rate, regular rhythm. Good peripheral circulation. Grossly normal heart sounds.   Respiratory: Normal respiratory effort.  No retractions. Lungs CTAB. Gastrointestinal: Soft and nontender. No distention.  Musculoskeletal: No lower extremity tenderness nor edema. No gross deformities of extremities. Neurologic:  Normal speech and language. Right face weakness including the forehead. Right eye not closing completely. Normal strength and sensation of RUE and RLE. No numbness.  Skin:  Skin is warm, dry and intact. Zoster rash over right neck and behind the right ear.    ____________________________________________   PROCEDURES  Procedure(s) performed:   Procedures  None ____________________________________________   INITIAL IMPRESSION / ASSESSMENT AND PLAN / ED COURSE  Pertinent labs & imaging results that were available during my care of the patient were reviewed by me and considered in my medical decision making (see chart for details).  Patient presents to the emergency department for evaluation of right face droop in the setting of shingles on the same side.  No shingles rash approaching the eye.  No lesions in the external ear canal or tympanic membrane.  Patient is already on Valtrex.   Plan to start steroid.  I long discussion about moisturizing the eye and taping a closed while sleeping to avoid corneal abrasions.   At this time, I do not feel there is any life-threatening condition present. I have reviewed and discussed all results (EKG, imaging, lab, urine as appropriate), exam findings with patient. I have reviewed nursing notes and appropriate previous records.  I feel the patient is safe to be discharged home without further emergent workup. Discussed usual and customary return precautions. Patient and family (if present) verbalize understanding and are comfortable with this plan.  Patient will follow-up with their primary care provider. If they do not have a primary care provider, information for follow-up has been provided to them. All questions have been answered.  ____________________________________________  FINAL CLINICAL IMPRESSION(S) / ED DIAGNOSES  Final diagnoses:  Bell's palsy  Herpes zoster with other complication    NEW OUTPATIENT MEDICATIONS STARTED DURING THIS VISIT:  Prednisone taper, Percocet, and Colace.   Note:  This document was prepared using Dragon voice recognition software and may include unintentional dictation errors.  Alona BeneJoshua Long, MD Emergency Medicine    Long, Arlyss RepressJoshua G, MD 09/16/17 [phone]

## 2017-09-21 ENCOUNTER — Encounter: Payer: Self-pay | Admitting: Family Medicine

## 2017-09-21 ENCOUNTER — Ambulatory Visit: Payer: 59 | Admitting: Family Medicine

## 2017-09-21 VITALS — BP 132/86 | HR 81 | Temp 98.5°F | Ht 67.0 in | Wt 183.0 lb

## 2017-09-21 DIAGNOSIS — G510 Bell's palsy: Secondary | ICD-10-CM

## 2017-09-21 MED ORDER — VALACYCLOVIR HCL 1 G PO TABS: 1000.0000 mg | ORAL_TABLET | Freq: Three times a day (TID) | ORAL | 0 refills | Status: AC

## 2017-09-21 NOTE — Patient Instructions (Addendum)
Take 6 tabs of prednisone tomorrow. Then you are done.  We are starting another round of Valtrex.  Artificial tears like Refresh and Systane may be used for comfort. OK to get generic version. Generally people use them every 2-4 hours, but you can use them as much as you want because there is no medication in it.  Let us know if you need anything.

## 2017-09-21 NOTE — Progress Notes (Signed)
Chief Complaint  Patient presents with  . Follow-up    ED    Subjective: Patient is a 57 y.o. male here for ED f/u.  Dx'd with Bell's palsy on 09/16/17. Finished Valtrex for Shingles 2 days ago. Current on Pred pack. Still having facial droop on R side. Able to close eye with time, getting better regarding dryness. Has been using artificial tears to aid. No vision changes or pain in eye.    ROS: Neuro: +facial droop  Past Medical History:  Diagnosis Date  . Anginal pain (HCC)   . Coronary artery disease   . Depression   . Essential hypertension 07/13/2016  . Family history of adverse reaction to anesthesia    " My Dad had bad reactions ,He would wake up & have hallucinations"  . GERD (gastroesophageal reflux disease)   . History of chicken pox   . History of colon polyps   . Hyperlipidemia       Objective: BP 132/86 (BP Location: Left Arm, Patient Position: Sitting, Cuff Size: Normal)   Pulse 81   Temp 98.5 F (36.9 C) (Oral)   Ht 5\' 7"  (1.702 m)   Wt 183 lb (83 kg)   SpO2 95%   BMI 28.66 kg/m  General: Awake, appears stated age HEENT: MMM, EOMi, sclera white, he is able to shut his eyes, there is a lid lag on the right Heart: RRR, no murmurs Lungs: No accessory muscle use MSK: 5/5 strength throughout Neuro: +R sided facial droop, sensation intact to light touch, deep tendon reflexes are equal and symmetric, no clonus, no cerebellar signs Psych: Age appropriate judgment and insight, normal affect and mood  Assessment and Plan: Bell's palsy - Plan: valACYclovir (VALTREX) 1000 MG tablet  He has been on a prednisone taper, will have him take 2 extra pills today for 60 mg total and then 6 pills tomorrow to change to taper into a burst.  We will add 7 days of TID Valtrex. OK to stop Gabapentin as rash looks much better.  Artificial tears.  Sounds like the dryness in his eyes improving, no concerns for corneal ulceration/abrasion today.  F/u prn. The patient voiced  understanding and agreement to the plan.  Jilda Rocheicholas Paul BremenWendling, DO 09/21/17  10:55 AM

## 2017-09-21 NOTE — Progress Notes (Signed)
Pre visit review using our clinic review tool, if applicable. No additional management support is needed unless otherwise documented below in the visit note. 

## 2017-09-25 ENCOUNTER — Encounter: Payer: Self-pay | Admitting: Family Medicine

## 2017-10-02 ENCOUNTER — Encounter: Payer: Self-pay | Admitting: Cardiology

## 2017-10-02 MED ORDER — ROSUVASTATIN CALCIUM 20 MG PO TABS: 20.0000 mg | ORAL_TABLET | Freq: Every day | ORAL | 3 refills | Status: DC

## 2017-10-02 NOTE — Telephone Encounter (Signed)
Please send Crestor 20 PO QD to pharmacy. Thanks.  He should be on this.   Jordan SchultzMark [name], MD

## 2017-10-02 NOTE — Telephone Encounter (Signed)
Left patient voice message pertaining to his Crestor prescription, which I updated to his pharmacy ([facility] on [address] [place]

## 2017-10-05 ENCOUNTER — Other Ambulatory Visit: Payer: Self-pay | Admitting: Family Medicine

## 2017-10-05 DIAGNOSIS — B029 Zoster without complications: Secondary | ICD-10-CM

## 2017-10-17 ENCOUNTER — Other Ambulatory Visit: Payer: Self-pay | Admitting: Cardiology

## 2017-10-17 DIAGNOSIS — I10 Essential (primary) hypertension: Secondary | ICD-10-CM

## 2017-11-03 ENCOUNTER — Other Ambulatory Visit: Payer: Self-pay | Admitting: Cardiology

## 2017-11-03 ENCOUNTER — Other Ambulatory Visit: Payer: Self-pay | Admitting: Family Medicine

## 2017-11-03 ENCOUNTER — Encounter: Payer: Self-pay | Admitting: Cardiology

## 2017-11-03 ENCOUNTER — Encounter: Payer: Self-pay | Admitting: Family Medicine

## 2017-11-03 MED ORDER — NITROGLYCERIN 0.4 MG SL SUBL: 0.4000 mg | SUBLINGUAL_TABLET | SUBLINGUAL | 1 refills | Status: DC | PRN

## 2017-11-03 MED ORDER — CLOPIDOGREL BISULFATE 75 MG PO TABS: 75.0000 mg | ORAL_TABLET | Freq: Every day | ORAL | 3 refills | Status: DC

## 2017-11-03 MED ORDER — ROSUVASTATIN CALCIUM 20 MG PO TABS: 20.0000 mg | ORAL_TABLET | Freq: Every day | ORAL | 3 refills | Status: DC

## 2017-11-03 NOTE — Telephone Encounter (Signed)
Pt's medications were sent to pt's pharmacy as requested. Confirmation received.  

## 2017-11-07 ENCOUNTER — Telehealth: Payer: Self-pay | Admitting: Family Medicine

## 2017-11-07 DIAGNOSIS — F324 Major depressive disorder, single episode, in partial remission: Secondary | ICD-10-CM

## 2017-11-07 NOTE — Telephone Encounter (Signed)
Pt called about the Rx of vortioxetine HBr (TRINTELLIX) 20 MG TABS [[id number]][[id number]]  He has run out since last night and is checking the status on the refill, contact pt to advise

## 2017-11-07 NOTE — Telephone Encounter (Signed)
Copied from CRM [phone]#[id number]. Topic: Quick Communication - Rx Refill/Question >> Nov 07, 2017  4:54 PM Alexander BergeronBarksdale, Harvey B wrote: Medication:  vortioxetine HBr (TRINTELLIX) 20 MG TABS [[id number]][[id number]]    Has the patient contacted their pharmacy? Yes.     (Agent: If no, request that the patient contact the pharmacy for the refill.)   Preferred Pharmacy (with phone number or street name): [facility]; pharmacist is waiting for an order   Agent: Please be advised that RX refills may take up to 3 business days. We ask that you follow-up with your pharmacy.

## 2017-11-08 MED ORDER — VORTIOXETINE HBR 20 MG PO TABS: 20.0000 mg | ORAL_TABLET | Freq: Every day | ORAL | 11 refills | Status: DC

## 2018-01-22 ENCOUNTER — Telehealth: Payer: Self-pay | Admitting: Family Medicine

## 2018-01-22 NOTE — Telephone Encounter (Signed)
Patients wife informed of PCP instructions.they will call back to schedule a nurse visit at his convenience

## 2018-01-22 NOTE — Telephone Encounter (Signed)
Copied from CRM [id number]. Topic: Appointment Scheduling - Scheduling Inquiry for Clinic >> Jan 19, 2018  8:59 AM Landry Mellow wrote: Reason for CRM: wife called would like to know if pt needs to have a booster shot for measles. She is concerned about the cases of measles going around.  Cb [phone]

## 2018-01-22 NOTE — Telephone Encounter (Addendum)
Since he was born after 1955, we should probably just give him shot. He could also get the titres, but would need shot if not immune. MMR would be easier and is what I recommend. TY.

## 2018-08-01 ENCOUNTER — Encounter: Payer: Self-pay | Admitting: Cardiology

## 2018-08-07 ENCOUNTER — Other Ambulatory Visit: Payer: Self-pay | Admitting: Cardiology

## 2018-08-07 DIAGNOSIS — I10 Essential (primary) hypertension: Secondary | ICD-10-CM

## 2018-08-07 NOTE — Telephone Encounter (Signed)
Outpatient Medication Detail    Disp Refills Start End   lisinopril (PRINIVIL,ZESTRIL) 20 MG tablet 90 tablet 3 10/17/2017    Sig: TAKE 1 TABLET BY MOUTH DAILY   Sent to pharmacy as: lisinopril (PRINIVIL,ZESTRIL) 20 MG tablet   E-Prescribing Status: Receipt confirmed by pharmacy (10/17/2017 2:08 PM EST)   Associated Diagnoses   Essential hypertension     Pharmacy   [id number] - [place],  - [address] AT [facility] [address]

## 2018-08-08 ENCOUNTER — Other Ambulatory Visit: Payer: Self-pay | Admitting: Cardiology

## 2018-08-08 MED ORDER — ROSUVASTATIN CALCIUM 20 MG PO TABS: 20.0000 mg | ORAL_TABLET | Freq: Every day | ORAL | 0 refills | Status: DC

## 2018-08-08 NOTE — Telephone Encounter (Signed)
Pt's medication was sent to pt's pharmacy as requested. Confirmation received.  °

## 2018-08-24 ENCOUNTER — Telehealth: Payer: Self-pay | Admitting: Family Medicine

## 2018-08-24 NOTE — Telephone Encounter (Signed)
Called patient to schedule Shingrix immunization.Left message for return call.

## 2018-08-24 NOTE — Telephone Encounter (Signed)
Copied from CRM [id number]. Topic: Quick Communication - See Telephone Encounter >> Aug 24, 2018  8:58 AM Jordan Hendricks, Shiquita C wrote: CRM for notification. See Telephone encounter for: 08/24/18.  Pt called in to schedule his first shingles injection.

## 2018-08-28 ENCOUNTER — Ambulatory Visit (INDEPENDENT_AMBULATORY_CARE_PROVIDER_SITE_OTHER): Payer: 59

## 2018-08-28 DIAGNOSIS — Z23 Encounter for immunization: Secondary | ICD-10-CM | POA: Diagnosis not present

## 2018-08-30 ENCOUNTER — Other Ambulatory Visit: Payer: Self-pay

## 2018-08-30 DIAGNOSIS — I10 Essential (primary) hypertension: Secondary | ICD-10-CM

## 2018-08-31 ENCOUNTER — Encounter: Payer: Self-pay | Admitting: Cardiology

## 2018-08-31 ENCOUNTER — Ambulatory Visit: Payer: 59 | Admitting: Cardiology

## 2018-08-31 VITALS — BP 116/72 | HR 71 | Ht 67.0 in | Wt 185.6 lb

## 2018-08-31 DIAGNOSIS — I209 Angina pectoris, unspecified: Secondary | ICD-10-CM

## 2018-08-31 DIAGNOSIS — I2510 Atherosclerotic heart disease of native coronary artery without angina pectoris: Secondary | ICD-10-CM

## 2018-08-31 DIAGNOSIS — E7800 Pure hypercholesterolemia, unspecified: Secondary | ICD-10-CM | POA: Diagnosis not present

## 2018-08-31 DIAGNOSIS — I10 Essential (primary) hypertension: Secondary | ICD-10-CM | POA: Diagnosis not present

## 2018-08-31 LAB — LIPID PANEL
Chol/HDL Ratio: 3.2 ratio (ref 0.0–5.0)
Cholesterol, Total: 158 mg/dL (ref 100–199)
HDL: 50 mg/dL (ref >39)
LDL CALC: 70 mg/dL (ref 0–99)
Triglycerides: 189 mg/dL — ABNORMAL HIGH (ref 0–149)
VLDL CHOLESTEROL CAL: 38 mg/dL (ref 5–40)

## 2018-08-31 MED ORDER — METOPROLOL SUCCINATE ER 25 MG PO TB24: 25.0000 mg | ORAL_TABLET | Freq: Every day | ORAL | 3 refills | Status: DC

## 2018-08-31 MED ORDER — LISINOPRIL 20 MG PO TABS: 20.0000 mg | ORAL_TABLET | Freq: Every day | ORAL | 3 refills | Status: DC

## 2018-08-31 MED ORDER — ROSUVASTATIN CALCIUM 20 MG PO TABS: 20.0000 mg | ORAL_TABLET | Freq: Every day | ORAL | 3 refills | Status: DC

## 2018-08-31 MED ORDER — NITROGLYCERIN 0.4 MG SL SUBL: 0.4000 mg | SUBLINGUAL_TABLET | SUBLINGUAL | 1 refills | Status: DC | PRN

## 2018-08-31 MED ORDER — ISOSORBIDE MONONITRATE ER 30 MG PO TB24: 15.0000 mg | ORAL_TABLET | Freq: Every day | ORAL | 3 refills | Status: DC

## 2018-08-31 NOTE — Progress Notes (Signed)
Cardiology Office Note:    Date:  08/31/2018   ID:  Jordan Hendricks, DOB [birth year], MRN [id number]  PCP:  Sharlene DoryWendling, Nicholas Paul, DO  Cardiologist:  No primary care provider on file.  Electrophysiologist:  None   Referring MD: Sharlene DoryWendling, Nicholas Paul*     History of Present Illness:    Jordan KleinDean Kueker is a 57 y.o. male here for follow-up of coronary artery disease.  RCA stent placed 10/07/2016.  Had a mild knot over arteriotomy radial site.  Improved.  Prior anginal symptoms were dyspnea on exertion central chest tightness jaw pain.  Father age 55 CABG.  Overall he is doing quite well.  Practicing attorney.  Quite busy.  Working on Sundays for instance.  When walking the 4 flights of stairs at his office sometimes he feels slightly winded at the top but he has not been going to [employer] as religiously as he used to.  Going [place] cruise soon [place] the [place].  Denies any fevers chills nausea vomiting syncope bleeding.  Did have shingles in December 2018.  Taking his medications.  No myalgias.  Doing well.  Past Medical History:  Diagnosis Date  . Anginal pain (HCC)   . Coronary artery disease   . Depression   . Essential hypertension 07/13/2016  . Family history of adverse reaction to anesthesia    " My Dad had bad reactions ,He would wake up & have hallucinations"  . GERD (gastroesophageal reflux disease)   . History of chicken pox   . History of colon polyps   . Hyperlipidemia     Past Surgical History:  Procedure Laterality Date  . CARDIAC CATHETERIZATION  10/07/2016  . CARDIAC CATHETERIZATION N/A 10/07/2016   Procedure: Left Heart Cath and Coronary Angiography;  Surgeon: Yvonne Kendallhristopher End, MD;  [facility];  Service: Cardiovascular;  Laterality: N/A;  . CARDIAC CATHETERIZATION N/A 10/07/2016   Procedure: Coronary Stent Intervention;  Surgeon: Yvonne Kendallhristopher End, MD;  Location: [facility];  Service: Cardiovascular;  Laterality: N/A;  RCA  .  PERCUTANEOUS CORONARY STENT INTERVENTION (PCI-S)  10/07/2016   RCA  . REFRACTIVE SURGERY Bilateral   . ROTATOR CUFF REPAIR Right 2015  . WISDOM TOOTH EXTRACTION      Current Medications: Current Meds  Medication Sig  . acetaminophen (TYLENOL) 325 MG tablet Take 650 mg by mouth every 6 (six) hours as needed for headache.  Marland Kitchen. aspirin EC 81 MG tablet Take 1 tablet (81 mg total) by mouth daily.  . Cholecalciferol (VITAMIN D3) 2000 units TABS Take 1 tablet by mouth daily.  . isosorbide mononitrate (IMDUR) 30 MG 24 hr tablet Take 0.5 tablets (15 mg total) by mouth daily.  Marland Kitchen. lisinopril (PRINIVIL,ZESTRIL) 20 MG tablet Take 1 tablet (20 mg total) by mouth daily.  . metoprolol succinate (TOPROL-XL) 25 MG 24 hr tablet Take 1 tablet (25 mg total) by mouth daily.  . Multiple Vitamins-Minerals (MULTIVITAMIN ADULT PO) Take 1 tablet by mouth daily.  . nitroGLYCERIN (NITROSTAT) 0.4 MG SL tablet Place 1 tablet (0.4 mg total) under the tongue every 5 (five) minutes as needed for chest pain.  . rosuvastatin (CRESTOR) 20 MG tablet Take 1 tablet (20 mg total) by mouth daily.  Marland Kitchen. vortioxetine HBr (TRINTELLIX) 20 MG TABS tablet Take 1 tablet (20 mg total) by mouth daily.  . [DISCONTINUED] clopidogrel (PLAVIX) 75 MG tablet Take 1 tablet (75 mg total) by mouth daily with breakfast.  . [DISCONTINUED] gabapentin (NEURONTIN) 400 MG capsule TAKE ONE CAPSULE  BY MOUTH THREE TIMES DAILY. OKAY TO TAKE 2 CAPSULES AT NIGHT BEFORE BEDTIME  . [DISCONTINUED] isosorbide mononitrate (IMDUR) 30 MG 24 hr tablet Take 0.5 tablets (15 mg total) by mouth daily.  . [DISCONTINUED] lisinopril (PRINIVIL,ZESTRIL) 20 MG tablet Take 1 tablet (20 mg total) by mouth daily. Please keep upcoming appt in December for future refills. Thank you  . [DISCONTINUED] metoprolol succinate (TOPROL-XL) 25 MG 24 hr tablet TAKE 1 TABLET BY MOUTH DAILY  . [DISCONTINUED] nitroGLYCERIN (NITROSTAT) 0.4 MG SL tablet Place 1 tablet (0.4 mg total) under the tongue every  5 (five) minutes as needed for chest pain.  . [DISCONTINUED] oxyCODONE-acetaminophen (PERCOCET/ROXICET) 5-325 MG tablet Take 1 tablet by mouth every 4 (four) hours as needed for severe pain.  . [DISCONTINUED] ranitidine (ZANTAC) 150 MG capsule Take 150 mg by mouth 2 (two) times daily.  . [DISCONTINUED] rosuvastatin (CRESTOR) 20 MG tablet Take 1 tablet (20 mg total) by mouth daily. Please keep upcoming appt in December for future refills. Thank you     Allergies:   Lactose intolerance (gi) and Tape   Social History   Socioeconomic History  . Marital status: Married    Spouse name: Not on file  . Number of children: Not on file  . Years of education: Not on file  . Highest education level: Not on file  Occupational History  . Not on file  Social Needs  . Financial resource strain: Not on file  . Food insecurity:    Worry: Not on file    Inability: Not on file  . Transportation needs:    Medical: Not on file    Non-medical: Not on file  Tobacco Use  . Smoking status: Never Smoker  . Smokeless tobacco: Never Used  Substance and Sexual Activity  . Alcohol use: Yes    Comment: social  . Drug use: No  . Sexual activity: Not on file  Lifestyle  . Physical activity:    Days per week: Not on file    Minutes per session: Not on file  . Stress: Not on file  Relationships  . Social connections:    Talks on phone: Not on file    Gets together: Not on file    Attends religious service: Not on file    Active member of club or organization: Not on file    Attends meetings of clubs or organizations: Not on file    Relationship status: Not on file  Other Topics Concern  . Not on file  Social History Narrative  . Not on file     Family History: The patient's family history includes Alzheimer's disease in his father and paternal grandmother; Diabetes in his father and maternal grandmother; Heart disease in his father; Hypertension in his father; Prostate cancer in his father.  ROS:    Please see the history of present illness.     All other systems reviewed and are negative.  EKGs/Labs/Other Studies Reviewed:    The following studies were reviewed today: 10/07/16: Cath Conclusion   FINDINGS: 1. 95% proximal RCA stenosis with competitive filling of the rPL branches via the LCx. 2. Mild to moderate, non-obstructive CAD involving the left coronary artery. 3. Pressure dampening in the LMCA and ostial RCA with catheter engagement that improved after intracoronary nitroglycerin consistent with vasospasm. 4. Normal left ventricular filling pressure. 5. Normal left ventricular contraction. 6. Successful PCI to proximal RCA with placement of Promus Premier 3.0 x 16 mm drug-eluting stent post-dilated with 3.5 mm  [place] balloon.  RECOMMENDATIONS: 1. Admit for overnight observation following PCI given significant vasospasm. 2. Patient loaded with prasugrel in the cath lab and will continue clopidogrel 75 mg daily for at least 6 months, ideally longer. 3. Aggressive secondary prevention and medical therapy for component of vasospasm.  Yvonne Kendall, MD    EKG:  EKG is  ordered today.  The ekg ordered today demonstrates sinus rhythm 71 with no other significant abnormalities.  Personally reviewed and interpreted  Recent Labs: No results found for requested labs within last 8760 hours.  Recent Lipid Panel    Component Value Date/Time   CHOL 130 01/13/2017 0827   TRIG 126.0 01/13/2017 0827   HDL 45.80 01/13/2017 0827   CHOLHDL 3 01/13/2017 0827   VLDL 25.2 01/13/2017 0827   LDLCALC 59 01/13/2017 0827   LDLDIRECT 198.0 07/13/2016 0808    Physical Exam:    VS:  BP 116/72   Pulse 71   Ht 5\' 7"  (1.702 m)   Wt 185 lb 9.6 oz (84.2 kg)   SpO2 97%   BMI 29.07 kg/m     Wt Readings from Last 3 Encounters:  08/31/18 185 lb 9.6 oz (84.2 kg)  09/21/17 183 lb (83 kg)  09/15/17 180 lb 12.8 oz (82 kg)     GEN:  Well nourished, well developed in no acute  distress HEENT: Normal NECK: No JVD; No carotid bruits LYMPHATICS: No lymphadenopathy CARDIAC: RRR, no murmurs, rubs, gallops RESPIRATORY:  Clear to auscultation without rales, wheezing or rhonchi  ABDOMEN: Soft, non-tender, non-distended MUSCULOSKELETAL:  No edema; No deformity  SKIN: Warm and dry NEUROLOGIC:  Alert and oriented x 3 PSYCHIATRIC:  Normal affect   ASSESSMENT:    1. Coronary artery disease involving native coronary artery of native heart without angina pectoris   2. Essential hypertension   3. Pure hypercholesterolemia   4. Angina pectoris (HCC)    PLAN:    In order of problems listed above:  Coronary artery disease status post RCA stent 1 /19/2018/angina - Doing quite well.  Previous inferior T wave inversions on ECG.  Continue with aggressive secondary prevention.  Okay to continue with aspirin only at this point.  Participated in cardiac rehabilitation. -Angina very well controlled with isosorbide low-dose 15 mg.  Mixed hyperlipidemia -LDL 198.  Crestor.  LDL 59 on 01/13/2017.  We will recheck the cholesterol panel.  Shingles - On scalp December 2018, Bell's palsy associated.  Overall doing well.  Vaccine.  Essential hypertension -Very well controlled today 116/72.  Medications reviewed.   Medication Adjustments/Labs and Tests Ordered: Current medicines are reviewed at length with the patient today.  Concerns regarding medicines are outlined above.  Orders Placed This Encounter  Procedures  . Lipid panel  . EKG 12-Lead   Meds ordered this encounter  Medications  . rosuvastatin (CRESTOR) 20 MG tablet    Sig: Take 1 tablet (20 mg total) by mouth daily.    Dispense:  90 tablet    Refill:  3  . metoprolol succinate (TOPROL-XL) 25 MG 24 hr tablet    Sig: Take 1 tablet (25 mg total) by mouth daily.    Dispense:  90 tablet    Refill:  3  . isosorbide mononitrate (IMDUR) 30 MG 24 hr tablet    Sig: Take 0.5 tablets (15 mg total) by mouth daily.     Dispense:  45 tablet    Refill:  3  . lisinopril (PRINIVIL,ZESTRIL) 20 MG tablet    Sig: Take  1 tablet (20 mg total) by mouth daily.    Dispense:  90 tablet    Refill:  3  . nitroGLYCERIN (NITROSTAT) 0.4 MG SL tablet    Sig: Place 1 tablet (0.4 mg total) under the tongue every 5 (five) minutes as needed for chest pain.    Dispense:  25 tablet    Refill:  1    Patient Instructions  Medication Instructions:  Your physician recommends that you continue on your current medications as directed. Please refer to the Current Medication list given to you today.  If you need a refill on your cardiac medications before your next appointment, please call your pharmacy.   Lab work: Lipids today  If you have labs (blood work) drawn today and your tests are completely normal, you will receive your results only by: Marland Kitchen MyChart Message (if you have MyChart) OR . A paper copy in the mail If you have any lab test that is abnormal or we need to change your treatment, we will call you to review the results.  Testing/Procedures: None  Follow-Up: At [facility], you and your health needs are our priority.  As part of our continuing mission to provide you with exceptional heart care, we have created designated Provider Care Teams.  These Care Teams include your primary Cardiologist (physician) and Advanced Practice Providers (APPs -  Physician Assistants and Nurse Practitioners) who all work together to provide you with the care you need, when you need it. You will need a follow up appointment in 12 months.  Please call our office 2 months in advance to schedule this appointment.  You may see Dr. Anne Fu or one of the following Advanced Practice Providers on your designated Care Team:   Norma Fredrickson, NP Nada Boozer, NP . Georgie Chard, NP  Any Other Special Instructions Will Be Listed Below (If Applicable).       Signed, Donato Schultz, MD  08/31/2018 8:33 AM    [facility]

## 2018-08-31 NOTE — Patient Instructions (Signed)
Medication Instructions:  Your physician recommends that you continue on your current medications as directed. Please refer to the Current Medication list given to you today.  If you need a refill on your cardiac medications before your next appointment, please call your pharmacy.   Lab work: Lipids today  If you have labs (blood work) drawn today and your tests are completely normal, you will receive your results only by: Marland Kitchen. MyChart Message (if you have MyChart) OR . A paper copy in the mail If you have any lab test that is abnormal or we need to change your treatment, we will call you to review the results.  Testing/Procedures: None  Follow-Up: At [facility], you and your health needs are our priority.  As part of our continuing mission to provide you with exceptional heart care, we have created designated Provider Care Teams.  These Care Teams include your primary Cardiologist (physician) and Advanced Practice Providers (APPs -  Physician Assistants and Nurse Practitioners) who all work together to provide you with the care you need, when you need it. You will need a follow up appointment in 12 months.  Please call our office 2 months in advance to schedule this appointment.  You may see Dr. Anne FuSkains or one of the following Advanced Practice Providers on your designated Care Team:   Norma FredricksonLori Gerhardt, NP Nada BoozerLaura Ingold, NP . Georgie ChardJill McDaniel, NP  Any Other Special Instructions Will Be Listed Below (If Applicable).

## 2018-10-03 ENCOUNTER — Other Ambulatory Visit: Payer: Self-pay | Admitting: Family Medicine

## 2018-10-03 MED ORDER — OSELTAMIVIR PHOSPHATE 75 MG PO CAPS: 75.0000 mg | ORAL_CAPSULE | Freq: Two times a day (BID) | ORAL | 0 refills | Status: AC

## 2018-10-30 DIAGNOSIS — Z79891 Long term (current) use of opiate analgesic: Secondary | ICD-10-CM | POA: Diagnosis not present

## 2018-11-07 ENCOUNTER — Ambulatory Visit (INDEPENDENT_AMBULATORY_CARE_PROVIDER_SITE_OTHER): Payer: 59 | Admitting: *Deleted

## 2018-11-07 DIAGNOSIS — Z23 Encounter for immunization: Secondary | ICD-10-CM

## 2018-11-07 NOTE — Progress Notes (Signed)
Patient in today for 2nd shingles vaccine  Vaccine given and patient tolerated well. 

## 2019-01-11 ENCOUNTER — Telehealth: Payer: Self-pay | Admitting: Family Medicine

## 2019-01-11 NOTE — Telephone Encounter (Signed)
Called to inform the patient that we are offering virtual visits. Last OV with PCP in 09/2017 The patient is currently not having a need for anything. Informed him we are here currently virtually if in need of anything to schedule with [place]. He appreciated the call and information.

## 2019-03-06 NOTE — Progress Notes (Signed)
Subjective:    Patient ID: Jordan Hendricks, male    DOB: Jul 22, 1961, 58 y.o.   MRN: [id number]  HPI Pt in today reports recent tick bite.  Location- lateral pec left side rib area. Rash at bite area- mild irritated rash. He thinks maybe from band aid. Tenderness in area- Days since removal/found tick- one day. Pt was playing disk golf this weekend. He also saw tick crawling on his rt leg which he states was not attached. Fevers-no Chills-no  Sweats-no bodyaches-no Palpitations-no Fatigue-no  Difficult to pull of. He is not sure if took head out. Picked at it with needle.   Review of Systems  Constitutional: Negative for chills, fatigue and fever.  Respiratory: Negative for cough, chest tightness, shortness of breath and wheezing.   Cardiovascular: Negative for chest pain and palpitations.  Gastrointestinal: Negative for abdominal pain, blood in stool, diarrhea, nausea and vomiting.  Musculoskeletal: Negative for back pain.  Skin:       Tick bite location.   Neurological: Negative for dizziness, speech difficulty, weakness, light-headedness and numbness.  Hematological: Negative for adenopathy. Does not bruise/bleed easily.  Psychiatric/Behavioral: Negative for behavioral problems, confusion, sleep disturbance and suicidal ideas. The patient is not nervous/anxious.     Past Medical History:  Diagnosis Date  . Anginal pain ([place])   . Coronary artery disease   . Depression   . Essential hypertension 07/13/2016  . Family history of adverse reaction to anesthesia    " My Dad had bad reactions ,He would wake up & have hallucinations"  . GERD (gastroesophageal reflux disease)   . History of chicken pox   . History of colon polyps   . Hyperlipidemia      Social History   Socioeconomic History  . Marital status: Married    Spouse name: Not on file  . Number of children: Not on file  . Years of education: Not on file  . Highest education level: Not on file   Occupational History  . Not on file  Social Needs  . Financial resource strain: Not on file  . Food insecurity    Worry: Not on file    Inability: Not on file  . Transportation needs    Medical: Not on file    Non-medical: Not on file  Tobacco Use  . Smoking status: Never Smoker  . Smokeless tobacco: Never Used  Substance and Sexual Activity  . Alcohol use: Yes    Comment: social  . Drug use: No  . Sexual activity: Not on file  Lifestyle  . Physical activity    Days per week: Not on file    Minutes per session: Not on file  . Stress: Not on file  Relationships  . Social Herbalist on phone: Not on file    Gets together: Not on file    Attends religious service: Not on file    Active member of club or organization: Not on file    Attends meetings of clubs or organizations: Not on file    Relationship status: Not on file  . Intimate partner violence    Fear of current or ex partner: Not on file    Emotionally abused: Not on file    Physically abused: Not on file    Forced sexual activity: Not on file  Other Topics Concern  . Not on file  Social History Narrative  . Not on file    Past Surgical History:  Procedure Laterality Date  .  CARDIAC CATHETERIZATION  10/07/2016  . CARDIAC CATHETERIZATION N/A 10/07/2016   Procedure: Left Heart Cath and Coronary Angiography;  Surgeon: Yvonne Kendallhristopher End, MD;  [facility];  Service: Cardiovascular;  Laterality: N/A;  . CARDIAC CATHETERIZATION N/A 10/07/2016   Procedure: Coronary Stent Intervention;  Surgeon: Yvonne Kendallhristopher End, MD;  Location: [facility];  Service: Cardiovascular;  Laterality: N/A;  RCA  . PERCUTANEOUS CORONARY STENT INTERVENTION (PCI-S)  10/07/2016   RCA  . REFRACTIVE SURGERY Bilateral   . ROTATOR CUFF REPAIR Right 2015  . WISDOM TOOTH EXTRACTION      Family History  Problem Relation Age of Onset  . Heart disease Father   . Prostate cancer Father   . Alzheimer's disease Father    . Diabetes Father   . Hypertension Father   . Diabetes Maternal Grandmother   . Alzheimer's disease Paternal Grandmother     Allergies  Allergen Reactions  . Lactose Intolerance (Gi)   . Tape Rash    Plastic tape    Current Outpatient Medications on File Prior to Visit  Medication Sig Dispense Refill  . acetaminophen (TYLENOL) 325 MG tablet Take 650 mg by mouth every 6 (six) hours as needed for headache.    Marland Kitchen. aspirin EC 81 MG tablet Take 1 tablet (81 mg total) by mouth daily. 30 tablet 11  . Cholecalciferol (VITAMIN D3) 2000 units TABS Take 1 tablet by mouth daily.    . isosorbide mononitrate (IMDUR) 30 MG 24 hr tablet Take 0.5 tablets (15 mg total) by mouth daily. 45 tablet 3  . lisinopril (PRINIVIL,ZESTRIL) 20 MG tablet Take 1 tablet (20 mg total) by mouth daily. 90 tablet 3  . metoprolol succinate (TOPROL-XL) 25 MG 24 hr tablet Take 1 tablet (25 mg total) by mouth daily. 90 tablet 3  . Multiple Vitamins-Minerals (MULTIVITAMIN ADULT PO) Take 1 tablet by mouth daily.    . nitroGLYCERIN (NITROSTAT) 0.4 MG SL tablet Place 1 tablet (0.4 mg total) under the tongue every 5 (five) minutes as needed for chest pain. 25 tablet 1  . rosuvastatin (CRESTOR) 20 MG tablet Take 1 tablet (20 mg total) by mouth daily. 90 tablet 3  . vortioxetine HBr (TRINTELLIX) 20 MG TABS tablet Take 1 tablet (20 mg total) by mouth daily. 30 tablet 11   No current facility-administered medications on file prior to visit.     There were no vitals taken for this visit.      Objective:   Physical Exam  General Mental Status- Alert. General Appearance- Not in acute distress.   Skin- left side chest lateral to pec. Small head of tick present vs scab. I removed since pt thought tic head. Mid induration and tender. Faint rash.used tweezer and removed easily.   Neck Carotid Arteries- Normal color. Moisture- Normal Moisture. No carotid bruits. No JVD.  Chest and Lung Exam Auscultation: Breath Sounds:-Normal.   Cardiovascular Auscultation:Rythm- Regular. Murmurs & Other Heart Sounds:Auscultation of the heart reveals- No Murmurs.  Abdomen Inspection:-Inspeection Normal. Palpation/Percussion:Note:No mass. Palpation and Percussion of the abdomen reveal- Non Tender, Non Distended + BS, no rebound or guarding.    Neurologic Cranial Nerve exam:- CN III-XII intact(No nystagmus), symmetric smile. Strength:- 5/5 equal and symmetric strength both upper and lower extremities.      Assessment & Plan:  For your recent tick bite. Expressed concern for tick born diseases vs possible early skin infection. Counseled on approach to treatment and work up. Antibody early on for tick bites likely can potentially can  come back falsely negative.  So  rx of doxycycline today.Start today   Explained rx doxycycline has coverage for lyme and rmsf. Rx sent. Rx advisement given.  Follow up 7-10 any persisting symptoms/signs or if needed  Whole FoodsEdward Abagail Limb, PA-C

## 2019-03-06 NOTE — Patient Instructions (Addendum)
For your recent tick bite. Expressed concern for tick born diseases vs possible early skin infection. Counseled on approach to treatment and work up. Antibody early on for tick bites likely can potentially can come back falsely negative. So  rx of doxycycline today.Start today  Explained rx doxycycline has coverage for lyme and rmsf. Rx sent. Rx advisement given.  Follow up 7-10 any persisting symptoms/signs or if needed

## 2019-03-07 ENCOUNTER — Ambulatory Visit: Payer: 59 | Admitting: Medical

## 2019-03-07 ENCOUNTER — Other Ambulatory Visit: Payer: Self-pay

## 2019-03-07 ENCOUNTER — Encounter: Payer: Self-pay | Admitting: Medical

## 2019-03-07 VITALS — BP 131/81 | HR 78 | Temp 98.4°F | Resp 16 | Ht 67.0 in | Wt 194.8 lb

## 2019-03-07 DIAGNOSIS — W57XXXA Bitten or stung by nonvenomous insect and other nonvenomous arthropods, initial encounter: Secondary | ICD-10-CM

## 2019-03-07 DIAGNOSIS — S30861A Insect bite (nonvenomous) of abdominal wall, initial encounter: Secondary | ICD-10-CM

## 2019-03-07 MED ORDER — DOXYCYCLINE HYCLATE 100 MG PO TABS: 100.0000 mg | ORAL_TABLET | Freq: Two times a day (BID) | ORAL | 0 refills | Status: DC

## 2019-06-12 ENCOUNTER — Other Ambulatory Visit: Payer: Self-pay

## 2019-06-12 ENCOUNTER — Ambulatory Visit (INDEPENDENT_AMBULATORY_CARE_PROVIDER_SITE_OTHER): Payer: 59 | Admitting: *Deleted

## 2019-06-12 DIAGNOSIS — Z23 Encounter for immunization: Secondary | ICD-10-CM

## 2019-06-12 NOTE — Progress Notes (Signed)
Patient here for flu shot.    Patient also wanted hepatitis c screening.  Advised patient that is a lab and to check insurance to see if it will cover.  Patient will check with insurance and call back to schedule lab only visit for hep c screening.  Vaccine given and patient tolerated well.

## 2019-06-19 ENCOUNTER — Telehealth: Payer: Self-pay | Admitting: *Deleted

## 2019-06-19 ENCOUNTER — Other Ambulatory Visit: Payer: Self-pay | Admitting: Cardiology

## 2019-06-19 DIAGNOSIS — Z1159 Encounter for screening for other viral diseases: Secondary | ICD-10-CM

## 2019-06-19 NOTE — Telephone Encounter (Signed)
Order in, OK to sched. Ty.

## 2019-06-19 NOTE — Telephone Encounter (Signed)
Dr Nani Ravens -- please see below and place future lab order if appropriate.  Copied from [place] [id number]. Topic: General - Other >> Jun 19, 2019  1:19 PM Mathis Bud wrote: Reason for CRM: patient would like to get a HEP C screening done.  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 Call back [phone]

## 2019-06-20 NOTE — Telephone Encounter (Signed)
Spoke with pt and scheduled lab appt for tomorrow at 1:45pm.

## 2019-06-21 ENCOUNTER — Other Ambulatory Visit (INDEPENDENT_AMBULATORY_CARE_PROVIDER_SITE_OTHER): Payer: 59

## 2019-06-21 ENCOUNTER — Other Ambulatory Visit: Payer: Self-pay

## 2019-06-21 DIAGNOSIS — Z1159 Encounter for screening for other viral diseases: Secondary | ICD-10-CM

## 2019-06-24 LAB — HEPATITIS C ANTIBODY
Hepatitis C Ab: NONREACTIVE
SIGNAL TO CUT-OFF: 0 (ref <1.00)

## 2019-09-09 ENCOUNTER — Ambulatory Visit: Payer: Self-pay | Admitting: *Deleted

## 2019-09-09 ENCOUNTER — Encounter (HOSPITAL_BASED_OUTPATIENT_CLINIC_OR_DEPARTMENT_OTHER): Payer: Self-pay | Admitting: Emergency Medicine

## 2019-09-09 ENCOUNTER — Emergency Department (HOSPITAL_BASED_OUTPATIENT_CLINIC_OR_DEPARTMENT_OTHER): Payer: 59

## 2019-09-09 ENCOUNTER — Other Ambulatory Visit: Payer: Self-pay

## 2019-09-09 ENCOUNTER — Emergency Department (HOSPITAL_BASED_OUTPATIENT_CLINIC_OR_DEPARTMENT_OTHER)
Admission: EM | Admit: 2019-09-09 | Discharge: 2019-09-09 | Disposition: A | Discharge status: Home / Self Care | Payer: 59 | Attending: Emergency Medicine | Admitting: Emergency Medicine

## 2019-09-09 DIAGNOSIS — R1032 Left lower quadrant pain: Secondary | ICD-10-CM | POA: Diagnosis present

## 2019-09-09 DIAGNOSIS — I10 Essential (primary) hypertension: Secondary | ICD-10-CM | POA: Diagnosis not present

## 2019-09-09 DIAGNOSIS — Z79899 Other long term (current) drug therapy: Secondary | ICD-10-CM | POA: Diagnosis not present

## 2019-09-09 DIAGNOSIS — N200 Calculus of kidney: Secondary | ICD-10-CM | POA: Diagnosis not present

## 2019-09-09 DIAGNOSIS — Z7982 Long term (current) use of aspirin: Secondary | ICD-10-CM | POA: Diagnosis not present

## 2019-09-09 LAB — COMPREHENSIVE METABOLIC PANEL
ALT: 31 U/L (ref 0–44)
AST: 25 U/L (ref 15–41)
Albumin: 4.2 g/dL (ref 3.5–5.0)
Alkaline Phosphatase: 71 U/L (ref 38–126)
Anion gap: 8 (ref 5–15)
BUN: 14 mg/dL (ref 6–20)
CO2: 25 mmol/L (ref 22–32)
Calcium: 9.6 mg/dL (ref 8.9–10.3)
Chloride: 106 mmol/L (ref 98–111)
Creatinine, Ser: 0.96 mg/dL (ref 0.61–1.24)
GFR calc Af Amer: >60 mL/min (ref >60)
GFR calc non Af Amer: >60 mL/min (ref >60)
Glucose, Bld: 106 mg/dL — ABNORMAL HIGH (ref 70–99)
Potassium: 4.2 mmol/L (ref 3.5–5.1)
Sodium: 139 mmol/L (ref 135–145)
Total Bilirubin: 1.1 mg/dL (ref 0.3–1.2)
Total Protein: 6.6 g/dL (ref 6.5–8.1)

## 2019-09-09 LAB — CBC WITH DIFFERENTIAL/PLATELET
Abs Immature Granulocytes: 0.02 10*3/uL (ref 0.00–0.07)
Basophils Absolute: 0.1 10*3/uL (ref 0.0–0.1)
Basophils Relative: 1 %
Eosinophils Absolute: 0.2 10*3/uL (ref 0.0–0.5)
Eosinophils Relative: 2 %
HCT: 43.7 % (ref 39.0–52.0)
Hemoglobin: 15 g/dL (ref 13.0–17.0)
Immature Granulocytes: 0 %
Lymphocytes Relative: 20 %
Lymphs Abs: 1.5 10*3/uL (ref 0.7–4.0)
MCH: 29.8 pg (ref 26.0–34.0)
MCHC: 34.3 g/dL (ref 30.0–36.0)
MCV: 86.9 fL (ref 80.0–100.0)
Monocytes Absolute: 0.6 10*3/uL (ref 0.1–1.0)
Monocytes Relative: 8 %
Neutro Abs: 5.2 10*3/uL (ref 1.7–7.7)
Neutrophils Relative %: 69 %
Platelets: 262 10*3/uL (ref 150–400)
RBC: 5.03 MIL/uL (ref 4.22–5.81)
RDW: 12.1 % (ref 11.5–15.5)
WBC: 7.6 10*3/uL (ref 4.0–10.5)
nRBC: 0 % (ref 0.0–0.2)

## 2019-09-09 LAB — URINALYSIS, MICROSCOPIC (REFLEX)

## 2019-09-09 LAB — URINALYSIS, ROUTINE W REFLEX MICROSCOPIC
Bilirubin Urine: NEGATIVE
Glucose, UA: NEGATIVE mg/dL
Ketones, ur: NEGATIVE mg/dL
Leukocytes,Ua: NEGATIVE
Nitrite: NEGATIVE
Protein, ur: NEGATIVE mg/dL
Specific Gravity, Urine: 1.02 (ref 1.005–1.030)
pH: 6.5 (ref 5.0–8.0)

## 2019-09-09 LAB — LIPASE, BLOOD: Lipase: 24 U/L (ref 11–51)

## 2019-09-09 MED ORDER — TAMSULOSIN HCL 0.4 MG PO CAPS: 0.4000 mg | ORAL_CAPSULE | Freq: Every day | ORAL | 0 refills | Status: DC

## 2019-09-09 MED ORDER — IOHEXOL 300 MG/ML  SOLN
100.0000 mL | Freq: Once | INTRAMUSCULAR | Status: AC | PRN
  Administered 2019-09-09: 100 mL via INTRAVENOUS

## 2019-09-09 MED ORDER — ONDANSETRON 4 MG PO TBDP: 4.0000 mg | ORAL_TABLET | Freq: Three times a day (TID) | ORAL | 0 refills | Status: DC | PRN

## 2019-09-09 MED ORDER — HYDROCODONE-ACETAMINOPHEN 5-325 MG PO TABS: 1.0000 | ORAL_TABLET | Freq: Four times a day (QID) | ORAL | 0 refills | Status: DC | PRN

## 2019-09-09 NOTE — Telephone Encounter (Signed)
He called in c/o having severe left lower quadrant abd pain that woke him up Sat. Morning.   It comes and goes but is severe when it happens.   He mentioned he has diverticulosis but does not know if this is from that or not.  See triage notes.  I have referred him to the ED.   He was agreeable to going and is going to the [facility] on [address] and [address] Now.   I let him know due to COVID-19 no one would be allowed to go with him inside the ED.   He verbalized understanding and was agreeable to the ED.  I sent my triage notes to Dr. Hollie Beach office.   Reason for Disposition . [1] SEVERE pain (e.g., excruciating) AND [2] present > 1 hour  Answer Assessment - Initial Assessment Questions 1. LOCATION: "Where does it hurt?"      Sat morning woke up with terrible pain on LLQ abd pain.   It went away then came back 6:30 PM yesterday and the pain lasted al night. I've had 2 colonoscopies and I have diverticulosis. The pain is terrible and I have to lay in bed doubled over 2. RADIATION: "Does the pain shoot anywhere else?" (e.g., chest, back)     It starts to left of navel it goes lower.   I've had more reflux lately.   3. ONSET: "When did the pain begin?" (Minutes, hours or days ago)      Sat morning I woke up with it. 4. SUDDEN: "Gradual or sudden onset?"     The pain woke me up Sat morning.   Yesterday I went to the bathroom but I had a hard time going I finally had a BM yesterday evening and again this morning. 5. PATTERN "Does the pain come and go, or is it constant?"    - If constant: "Is it getting better, staying the same, or worsening?"      (Note: Constant means the pain never goes away completely; most serious pain is constant and it progresses)     - If intermittent: "How long does it last?" "Do you have pain now?"     (Note: Intermittent means the pain goes away completely between bouts)     Pain comes and goes 6. SEVERITY: "How bad is the pain?"   (e.g., Scale 1-10; mild, moderate, or severe)    - MILD (1-3): doesn't interfere with normal activities, abdomen soft and not tender to touch     - MODERATE (4-7): interferes with normal activities or awakens from sleep, tender to touch     - SEVERE (8-10): excruciating pain, doubled over, unable to do any normal activities       8-9 on pain scale when it hits. 7. RECURRENT SYMPTOM: "Have you ever had this type of abdominal pain before?" If so, ask: "When was the last time?" and "What happened that time?"      I can't recall having this pain before. 8. CAUSE: "What do you think is causing the abdominal pain?"     Maybe diverticulosis. 9. RELIEVING/AGGRAVATING FACTORS: "What makes it better or worse?" (e.g., movement, antacids, bowel movement)     I will lay on my left side that helps a little. 10. OTHER SYMPTOMS: "Has there been any vomiting, diarrhea, constipation, or urine problems?"       No constipation issues.   I typically have 2-3 BMs after breakfast and 1 after lunch.  No urinary issues.  Protocols used: ABDOMINAL PAIN - MALE-A-AH

## 2019-09-09 NOTE — ED Provider Notes (Signed)
[facility] EMERGENCY DEPARTMENT Provider Note   CSN: [id number] Arrival date & time: 09/09/19  0944     History Chief Complaint  Patient presents with  . Abdominal Pain    Jordan Hendricks is a 58 y.o. male presenting for evaluation of abdominal pain.  Patient states 2 days ago he had acute onset left lower quadrant abdominal pain.  Pain lasted for several hours, before resolving without intervention.  He had associated cold sweats at that time.  He had a similar episode of pain last night, which once again lasted for several hours.  He called his doctor this morning, who recommended he come to the ER for further evaluation.  Pain has been in the left lower quadrant both times.  He denies fevers, chills, chest pain, shortness of breath, nausea, vomiting, urinary symptoms, normal bowel movements.  Pain is not improved or precipitated with urination or bowel movements.  No history of kidney stones.  Patient states he has had a colonoscopy which showed diverticulosis.  He has never had any abdominal surgeries.  He has not taken anything for his symptoms and is currently pain-free.  HPI     Past Medical History:  Diagnosis Date  . Anginal pain (HCC)   . Coronary artery disease   . Depression   . Essential hypertension 07/13/2016  . Family history of adverse reaction to anesthesia    " My Dad had bad reactions ,He would wake up & have hallucinations"  . GERD (gastroesophageal reflux disease)   . History of chicken pox   . History of colon polyps   . Hyperlipidemia     Patient Active Problem List   Diagnosis Date Noted  . Chest pain 10/07/2016  . Stable angina (HCC) 10/07/2016  . Angina decubitus (HCC) 09/30/2016  . Family history of early CAD 09/30/2016  . Pure hypercholesterolemia 09/30/2016  . Essential hypertension 07/13/2016  . Depression 07/13/2016    Past Surgical History:  Procedure Laterality Date  . CARDIAC CATHETERIZATION  10/07/2016  . CARDIAC  CATHETERIZATION N/A 10/07/2016   Procedure: Left Heart Cath and Coronary Angiography;  Surgeon: Yvonne Kendall, MD;  Location: [facility] INVASIVE CV LAB;  Service: Cardiovascular;  Laterality: N/A;  . CARDIAC CATHETERIZATION N/A 10/07/2016   Procedure: Coronary Stent Intervention;  Surgeon: Yvonne Kendall, MD;  Location: [facility] INVASIVE CV LAB;  Service: Cardiovascular;  Laterality: N/A;  RCA  . PERCUTANEOUS CORONARY STENT INTERVENTION (PCI-S)  10/07/2016   RCA  . REFRACTIVE SURGERY Bilateral   . ROTATOR CUFF REPAIR Right 2015  . WISDOM TOOTH EXTRACTION         Family History  Problem Relation Age of Onset  . Heart disease Father   . Prostate cancer Father   . Alzheimer's disease Father   . Diabetes Father   . Hypertension Father   . Diabetes Maternal Grandmother   . Alzheimer's disease Paternal Grandmother     Social History   Tobacco Use  . Smoking status: Never Smoker  . Smokeless tobacco: Never Used  Substance Use Topics  . Alcohol use: Yes    Comment: social  . Drug use: No    Home Medications Prior to Admission medications   Medication Sig Start Date End Date Taking? Authorizing Provider  aspirin EC 81 MG tablet Take 1 tablet (81 mg total) by mouth daily. 09/30/16   Jake Bathe, MD  buPROPion (WELLBUTRIN XL) 150 MG 24 hr tablet  06/04/19   [provider]  HYDROcodone-acetaminophen (NORCO/VICODIN) 5-325 MG tablet  Take 1 tablet by mouth every 6 (six) hours as needed for severe pain. 09/09/19   Yvonnie Schinke, PA-C  isosorbide mononitrate (IMDUR) 30 MG 24 hr tablet TAKE ONE-HALF TABLET BY  MOUTH DAILY 06/21/19   Jake BatheSkains, Mark C, MD  lisinopril (ZESTRIL) 20 MG tablet TAKE 1 TABLET BY MOUTH  DAILY 06/21/19   Jake BatheSkains, Mark C, MD  metoprolol succinate (TOPROL-XL) 25 MG 24 hr tablet TAKE 1 TABLET BY MOUTH  DAILY 06/21/19   Jake BatheSkains, Mark C, MD  Multiple Vitamins-Minerals (MULTIVITAMIN ADULT PO) Take 1 tablet by mouth daily.    [provider]  nitroGLYCERIN (NITROSTAT)  0.4 MG SL tablet Place 1 tablet (0.4 mg total) under the tongue every 5 (five) minutes as needed for chest pain. 08/31/18   Jake BatheSkains, Mark C, MD  ondansetron (ZOFRAN ODT) 4 MG disintegrating tablet Take 1 tablet (4 mg total) by mouth every 8 (eight) hours as needed for nausea or vomiting. 09/09/19   Ernestene Coover, PA-C  rosuvastatin (CRESTOR) 20 MG tablet TAKE 1 TABLET BY MOUTH  DAILY 06/21/19   Jake BatheSkains, Mark C, MD  tamsulosin (FLOMAX) 0.4 MG CAPS capsule Take 1 capsule (0.4 mg total) by mouth daily. 09/09/19   Tammatha Cobb, PA-C  vortioxetine HBr (TRINTELLIX) 20 MG TABS tablet Take 1 tablet (20 mg total) by mouth daily. 11/08/17   Sharlene DoryWendling, Nicholas Paul, DO    Allergies    Lactose intolerance (gi) and Tape  Review of Systems   Review of Systems  Gastrointestinal: Positive for abdominal pain.  All other systems reviewed and are negative.   Physical Exam Updated Vital Signs BP 131/87 (BP Location: Left Arm)   Pulse 67   Temp 98.4 F (36.9 C)   Resp 16   Ht 5\' 8"  (1.727 m)   Wt 88 kg   SpO2 98%   BMI 29.50 kg/m   Physical Exam Vitals and nursing note reviewed.  Constitutional:      General: He is not in acute distress.    Appearance: He is well-developed.     Comments: Resting comfortably in the bed in no acute distress  HENT:     Head: Normocephalic and atraumatic.  Eyes:     Conjunctiva/sclera: Conjunctivae normal.     Pupils: Pupils are equal, round, and reactive to light.  Cardiovascular:     Rate and Rhythm: Normal rate and regular rhythm.     Pulses: Normal pulses.  Pulmonary:     Effort: Pulmonary effort is normal. No respiratory distress.     Breath sounds: Normal breath sounds. No wheezing.  Abdominal:     General: Bowel sounds are normal. There is no distension.     Palpations: Abdomen is soft. There is no mass.     Tenderness: There is no abdominal tenderness. There is no guarding or rebound.       Comments: No tenderness palpation the abdomen.  Soft  without rigidity, guarding, distention.  Negative rebound.  No signs of peritonitis.  Musculoskeletal:        General: Normal range of motion.     Cervical back: Normal range of motion and neck supple.  Skin:    General: Skin is warm and dry.     Capillary Refill: Capillary refill takes less than 2 seconds.  Neurological:     Mental Status: He is alert and oriented to person, place, and time.     ED Results / Procedures / Treatments   Labs (all labs ordered are listed, but only  abnormal results are displayed) Labs Reviewed  URINALYSIS, ROUTINE W REFLEX MICROSCOPIC - Abnormal; Notable for the following components:      Result Value   Hgb urine dipstick MODERATE (*)    All other components within normal limits  COMPREHENSIVE METABOLIC PANEL - Abnormal; Notable for the following components:   Glucose, Bld 106 (*)    All other components within normal limits  URINALYSIS, MICROSCOPIC (REFLEX) - Abnormal; Notable for the following components:   Bacteria, UA RARE (*)    All other components within normal limits  CBC WITH DIFFERENTIAL/PLATELET  LIPASE, BLOOD    EKG None  Radiology CT ABDOMEN PELVIS W CONTRAST  Result Date: 09/09/2019 CLINICAL DATA:  Diverticulitis suspected EXAM: CT ABDOMEN AND PELVIS WITH CONTRAST TECHNIQUE: Multidetector CT imaging of the abdomen and pelvis was performed using the standard protocol following bolus administration of intravenous contrast. CONTRAST:  OMNIPAQUE IOHEXOL 300 MG/ML  SOLN COMPARISON:  None FINDINGS: Lower chest: Lung bases are clear. Hepatobiliary: Moderate to marked hepatic steatosis. Lobular hepatic contours. Patent portal vein. Hyperenhancing lesion in the right hepatic lobe peripherally 1 cm. (Image 19, series 2) Gallbladder is normal.  No signs of biliary ductal dilation. Pancreas: Pancreas is normal. No signs of focal pancreatic lesion or evidence of ductal dilation. Spleen: Spleen is normal. Adrenals/Urinary Tract: Adrenal glands  are normal. Bilateral renal calculi. Two on the right in the lower pole measuring 3-4 mm. Another in the interpolar portion of the right kidney at 2-3 mm. A single intrarenal calculus on the left measuring 4 mm. No signs of hydronephrosis or significant perinephric stranding. Along the course of the distal left ureter is a single calcification. There are calcifications elsewhere in the pelvis. This calcification is noted at 3 mm. There may be trace left Peri-ureteral stranding and distension. The urinary bladder is normal. Stomach/Bowel: CT appearance of stomach and small bowel is normal. The appendix is normal. No sign of significant diverticulosis or diverticulitis. Vascular/Lymphatic: Scattered atherosclerotic calcifications. No signs of aneurysm or acute vascular process. No adenopathy. No sign of pelvic lymphadenopathy. Reproductive: Prostate with calcifications. Other: Small fat containing left inguinal hernia. Musculoskeletal: No acute bone finding or destructive bone process. IMPRESSION: 1. Distal left ureteral calculus without signs of hydronephrosis but with mild left ureteral distension. 2. Hepatic steatosis with focal hypervascular hepatic lesion. This is likely hemangioma. However, given the question of liver disease MRI follow-up may be helpful to exclude hepatic neoplasm. Comparison with prior imaging if available could also be performed. Electronically Signed   By: Donzetta Kohut M.D.   On: 09/09/2019 12:29    Procedures Procedures (including critical care time)  Medications Ordered in ED Medications  iohexol (OMNIPAQUE) 300 MG/ML solution 100 mL (100 mLs Intravenous Contrast Given 09/09/19 1151)    ED Course  I have reviewed the triage vital signs and the nursing notes.  Pertinent labs & imaging results that were available during my care of the patient were reviewed by me and considered in my medical decision making (see chart for details).    MDM Rules/Calculators/A&P                       Patient presenting for evaluation of of 2 episodes of abdominal pain.  Physical exam reassuring, patient is nontoxic.  He has no pain at this time.  No abdominal tenderness.  Consider kidney stone due to colicky nature of pain.  Consider diverticulitis due to location of pain, although less likely as pain  is intermittent.  Consider intestinal spasm versus enteritis versus viral illness.  Will obtain labs and CT for further evaluation.  Labs reassuring, no leukocytosis.  Kidney, liver, pancreatic function normal.  Urine with moderate blood, no leuks or white cells, doubt infection.  CT pending.  CT shows distal left stone, likely the cause of patient's pain.  Discussed findings with patient.  Discussed symptomatic treatment and follow-up with urology as needed.  Urine strainer given.  PMP checked, patient without concerning narcotic use.  Patient is remained stable, no pain in the ED.  At this time, patient appears safe for discharge.  Return precautions given.  Patient states he understands and agrees to plan.  Final Clinical Impression(s) / ED Diagnoses Final diagnoses:  Kidney stone    Rx / DC Orders ED Discharge Orders         Ordered    HYDROcodone-acetaminophen (NORCO/VICODIN) 5-325 MG tablet  Every 6 hours PRN     09/09/19 1310    ondansetron (ZOFRAN ODT) 4 MG disintegrating tablet  Every 8 hours PRN     09/09/19 1310    tamsulosin (FLOMAX) 0.4 MG CAPS capsule  Daily     09/09/19 1310           Tavin Vernet, PA-C 09/09/19 1505    Gareth Morgan, MD 09/10/19 [id number]

## 2019-09-09 NOTE — Telephone Encounter (Signed)
FYI

## 2019-09-09 NOTE — ED Triage Notes (Signed)
Episode of LLQ pain 2 days ago.  Another episode last evening that lasted longer.  No pain yet today but pmd sent pt here for eval and abd scan. Denies n/v/d.

## 2019-09-09 NOTE — ED Notes (Signed)
Pt returned from CT °

## 2019-09-09 NOTE — Discharge Instructions (Signed)
Take Tylenol as needed for mild to moderate pain. Use Norco as needed for severe breakthrough pain.  Have caution, this is a narcotic medicine.  Do not drive or operate heavy machinery while taking this medicine. After cardiologist tomorrow to ask whether you are able to take ibuprofen.  If so, use this as needed for mild to moderate pain as well. Make sure you are staying well-hydrated water. Take Flomax daily to help pass the stone. Use Zofran as needed for nausea or vomiting. Follow-up with the urologist as needed if your pain is not improving. Return to the emergency room if you develop fevers, persistent vomiting despite medication, severe worsening pain unrelieved with medication, inability to urinate, or any new, worsening, or concerning symptoms.

## 2019-09-10 ENCOUNTER — Telehealth: Payer: Self-pay | Admitting: Family Medicine

## 2019-09-10 ENCOUNTER — Ambulatory Visit: Payer: 59 | Admitting: Cardiology

## 2019-09-10 ENCOUNTER — Encounter: Payer: Self-pay | Admitting: Cardiology

## 2019-09-10 VITALS — BP 100/62 | HR 67 | Ht 68.0 in | Wt 190.0 lb

## 2019-09-10 DIAGNOSIS — I10 Essential (primary) hypertension: Secondary | ICD-10-CM

## 2019-09-10 DIAGNOSIS — E7800 Pure hypercholesterolemia, unspecified: Secondary | ICD-10-CM

## 2019-09-10 DIAGNOSIS — I2510 Atherosclerotic heart disease of native coronary artery without angina pectoris: Secondary | ICD-10-CM | POA: Diagnosis not present

## 2019-09-10 DIAGNOSIS — N200 Calculus of kidney: Secondary | ICD-10-CM

## 2019-09-10 MED ORDER — NITROGLYCERIN 0.4 MG SL SUBL: 0.4000 mg | SUBLINGUAL_TABLET | SUBLINGUAL | 99 refills | Status: DC | PRN

## 2019-09-10 NOTE — Patient Instructions (Signed)
Medication Instructions:  Please discontinue your Isosorbide.  Continue all other medications as listed.  If you need a refill on your cardiac medications before your next appointment, please call your pharmacy*  Follow-Up: At [facility], you and your health needs are our priority.  As part of our continuing mission to provide you with exceptional heart care, we have created designated Provider Care Teams.  These Care Teams include your primary Cardiologist (physician) and Advanced Practice Providers (APPs -  Physician Assistants and Nurse Practitioners) who all work together to provide you with the care you need, when you need it.  Your next appointment:   12 month(s)  The format for your next appointment:   In Person  Provider:   Candee Furbish, MD  Thank you for choosing [facility]!!

## 2019-09-10 NOTE — Progress Notes (Signed)
Cardiology Office Note:    Date:  09/10/2019   ID:  Jordan Hendricks, DOB 1961/06/25, MRN [id number]  PCP:  Jordan Dory, DO  Cardiologist:  No primary care provider on file.  Electrophysiologist:  None   Referring MD: Jordan Hendricks*     History of Present Illness:    Jordan Hendricks is a 58 y.o. male here for follow-up of coronary artery disease.  RCA stent placed 10/07/2016.  Had a mild knot over arteriotomy radial site.  Improved.  Prior anginal symptoms were dyspnea on exertion central chest tightness jaw pain.  Father age 8 CABG.  Overall he is doing quite well.  Practicing attorney.  Quite busy.  Working on Sundays for instance.  When walking the 4 flights of stairs at his office sometimes he feels slightly winded at the top but he has not been going to [employer] as religiously as he used to.  Going on a cruise soon to the [place].  Denies any fevers chills nausea vomiting syncope bleeding.  Did have shingles in December 2018.  Taking his medications.  No myalgias.  Doing well.  09/10/2019-here for follow-up of coronary artery disease.  Yesterday he was in the emergency department for kidney stone left lower quadrant pain.  Overall with his heart, he has been doing quite well.  EKG stable.  Medication compliance.  Had quite high cholesterol levels without Crestor.  Taking this well without any myalgias.  No anginal symptoms.  Has not had to use any nitroglycerin.  No fevers chills nausea vomiting syncope bleeding.  Past Medical History:  Diagnosis Date  . Anginal pain (HCC)   . Coronary artery disease   . Depression   . Essential hypertension 07/13/2016  . Family history of adverse reaction to anesthesia    " My Dad had bad reactions ,He would wake up & have hallucinations"  . GERD (gastroesophageal reflux disease)   . History of chicken pox   . History of colon polyps   . Hyperlipidemia     Past Surgical History:  Procedure Laterality  Date  . CARDIAC CATHETERIZATION  10/07/2016  . CARDIAC CATHETERIZATION N/A 10/07/2016   Procedure: Left Heart Cath and Coronary Angiography;  Surgeon: Jordan Kendall, MD;  Location: [facility] INVASIVE CV LAB;  Service: Cardiovascular;  Laterality: N/A;  . CARDIAC CATHETERIZATION N/A 10/07/2016   Procedure: Coronary Stent Intervention;  Surgeon: Jordan Kendall, MD;  Location: [facility] INVASIVE CV LAB;  Service: Cardiovascular;  Laterality: N/A;  RCA  . PERCUTANEOUS CORONARY STENT INTERVENTION (PCI-S)  10/07/2016   RCA  . REFRACTIVE SURGERY Bilateral   . ROTATOR CUFF REPAIR Right 2015  . WISDOM TOOTH EXTRACTION      Current Medications: Current Meds  Medication Sig  . aspirin EC 81 MG tablet Take 1 tablet (81 mg total) by mouth daily.  Marland Kitchen buPROPion (WELLBUTRIN XL) 300 MG 24 hr tablet Take 300 mg by mouth daily.  . busPIRone (BUSPAR) 30 MG tablet Take 30 mg by mouth 2 (two) times daily.  Marland Kitchen HYDROcodone-acetaminophen (NORCO/VICODIN) 5-325 MG tablet Take 1 tablet by mouth every 6 (six) hours as needed for severe pain.  Marland Kitchen lisinopril (ZESTRIL) 20 MG tablet TAKE 1 TABLET BY MOUTH  DAILY  . metoprolol succinate (TOPROL-XL) 25 MG 24 hr tablet TAKE 1 TABLET BY MOUTH  DAILY  . Multiple Vitamins-Minerals (MULTIVITAMIN ADULT PO) Take 1 tablet by mouth daily.  . nitroGLYCERIN (NITROSTAT) 0.4 MG SL tablet Place 1 tablet (0.4 mg total) under the tongue every 5 (  five) minutes as needed for chest pain.  Marland Kitchen ondansetron (ZOFRAN ODT) 4 MG disintegrating tablet Take 1 tablet (4 mg total) by mouth every 8 (eight) hours as needed for nausea or vomiting.  . rosuvastatin (CRESTOR) 20 MG tablet TAKE 1 TABLET BY MOUTH  DAILY  . tamsulosin (FLOMAX) 0.4 MG CAPS capsule Take 1 capsule (0.4 mg total) by mouth daily.  Marland Kitchen vortioxetine HBr (TRINTELLIX) 10 MG TABS tablet Take 10 mg by mouth daily.  . [DISCONTINUED] isosorbide mononitrate (IMDUR) 30 MG 24 hr tablet TAKE ONE-HALF TABLET BY  MOUTH DAILY  . [DISCONTINUED] nitroGLYCERIN  (NITROSTAT) 0.4 MG SL tablet Place 1 tablet (0.4 mg total) under the tongue every 5 (five) minutes as needed for chest pain.     Allergies:   Lactose intolerance (gi) and Tape   Social History   Socioeconomic History  . Marital status: Married    Spouse name: Not on file  . Number of children: Not on file  . Years of education: Not on file  . Highest education level: Not on file  Occupational History  . Not on file  Tobacco Use  . Smoking status: Never Smoker  . Smokeless tobacco: Never Used  Substance and Sexual Activity  . Alcohol use: Yes    Comment: social  . Drug use: No  . Sexual activity: Not on file  Other Topics Concern  . Not on file  Social History Narrative  . Not on file   Social Determinants of Health   Financial Resource Strain:   . Difficulty of Paying Living Expenses: Not on file  Food Insecurity:   . Worried About Charity fundraiser in the Last Year: Not on file  . Ran Out of Food in the Last Year: Not on file  Transportation Needs:   . Lack of Transportation (Medical): Not on file  . Lack of Transportation (Non-Medical): Not on file  Physical Activity:   . Days of Exercise per Week: Not on file  . Minutes of Exercise per Session: Not on file  Stress:   . Feeling of Stress : Not on file  Social Connections:   . Frequency of Communication with Friends and Family: Not on file  . Frequency of Social Gatherings with Friends and Family: Not on file  . Attends Religious Services: Not on file  . Active Member of Clubs or Organizations: Not on file  . Attends Archivist Meetings: Not on file  . Marital Status: Not on file     Family History: The patient's family history includes Alzheimer's disease in his father and paternal grandmother; Diabetes in his father and maternal grandmother; Heart disease in his father; Hypertension in his father; Prostate cancer in his father.  ROS:   Please see the history of present illness.     All other  systems reviewed and are negative.  EKGs/Labs/Other Studies Reviewed:    The following studies were reviewed today: 10/07/16: Cath Conclusion   FINDINGS: 1. 95% proximal RCA stenosis with competitive filling of the rPL branches via the LCx. 2. Mild to moderate, non-obstructive CAD involving the left coronary artery. 3. Pressure dampening in the LMCA and ostial RCA with catheter engagement that improved after intracoronary nitroglycerin consistent with vasospasm. 4. Normal left ventricular filling pressure. 5. Normal left ventricular contraction. 6. Successful PCI to proximal RCA with placement of Promus Premier 3.0 x 16 mm drug-eluting stent post-dilated with 3.5 mm [place] balloon.  RECOMMENDATIONS: 1. Admit for overnight observation following PCI given  significant vasospasm. 2. Patient loaded with prasugrel in the cath lab and will continue clopidogrel 75 mg daily for at least 6 months, ideally longer. 3. Aggressive secondary prevention and medical therapy for component of vasospasm.  Jordan Kendall, MD    EKG:  EKG is  ordered today.  The ekg ordered today demonstrates sinus rhythm 67 with no other abnormalities-prior sinus rhythm 71 with no other significant abnormalities.  Personally reviewed and interpreted  Recent Labs: 09/09/2019: ALT 31; BUN 14; Creatinine, Ser 0.96; Hemoglobin 15.0; Platelets 262; Potassium 4.2; Sodium 139  Recent Lipid Panel    Component Value Date/Time   CHOL 158 08/31/2018 0836   TRIG 189 (H) 08/31/2018 0836   HDL 50 08/31/2018 0836   CHOLHDL 3.2 08/31/2018 0836   CHOLHDL 3 01/13/2017 0827   VLDL 25.2 01/13/2017 0827   LDLCALC 70 08/31/2018 0836   LDLDIRECT 198.0 07/13/2016 0808    Physical Exam:    VS:  BP 100/62   Pulse 67   Ht  (1.727 m)   Wt 190 lb (86.2 kg)   SpO2 97%   BMI 28.89 kg/m     Wt Readings from Last 3 Encounters:  09/10/19 190 lb (86.2 kg)  09/09/19 194 lb (88 kg)  03/07/19 194 lb 12.8 oz (88.4 kg)     GEN: Well  nourished, well developed, in no acute distress  HEENT: normal  Neck: no JVD, carotid bruits, or masses Cardiac: RRR; no murmurs, rubs, or gallops,no edema  Respiratory:  clear to auscultation bilaterally, normal work of breathing GI: soft, nontender, nondistended, + BS MS: no deformity or atrophy  Skin: warm and dry, no rash Neuro:  Alert and Oriented x 3, Strength and sensation are intact Psych: euthymic mood, full affect   ASSESSMENT:    1. Coronary artery disease involving native coronary artery of native heart without angina pectoris   2. Pure hypercholesterolemia   3. Essential hypertension   4. Nephrolithiasis    PLAN:    In order of problems listed above:  Coronary artery disease status post RCA stent 10/07/2016/angina - Doing quite well.  Previous inferior T wave inversions on ECG.  Okay to continue with aspirin only at this point.  Participated in cardiac rehabilitation. -Since he has not had any anginal symptoms recently, I will discontinue his isosorbide 15 mg. -Continue with aggressive secondary prevention.  Mixed hyperlipidemia -LDL 198.  Crestor.  LDL 59 on 01/13/2017.  We will recheck the cholesterol panel.  70 in 2019.  His PCP will be checking his levels.  Prior Shingles - On scalp December 2018, Bell's palsy associated.  Overall doing well.  Vaccine.  Essential hypertension -Very well controlled today.  Medications reviewed.  Nephrolithiasis -Left ureter, passed stone.  Showed him images on CT.  Medication Adjustments/Labs and Tests Ordered: Current medicines are reviewed at length with the patient today.  Concerns regarding medicines are outlined above.  No orders of the defined types were placed in this encounter.  Meds ordered this encounter  Medications  . nitroGLYCERIN (NITROSTAT) 0.4 MG SL tablet    Sig: Place 1 tablet (0.4 mg total) under the tongue every 5 (five) minutes as needed for chest pain.    Dispense:  25 tablet    Refill:  prn     Patient Instructions  Medication Instructions:  Please discontinue your Isosorbide.  Continue all other medications as listed.  If you need a refill on your cardiac medications before your next appointment, please call your pharmacy*  Follow-Up: At [facility], you and your health needs are our priority.  As part of our continuing mission to provide you with exceptional heart care, we have created designated Provider Care Teams.  These Care Teams include your primary Cardiologist (physician) and Advanced Practice Providers (APPs -  Physician Assistants and Nurse Practitioners) who all work together to provide you with the care you need, when you need it.  Your next appointment:   12 month(s)  The format for your next appointment:   In Person  Provider:   Donato SchultzMark , MD  Thank you for choosing [facility]!!         Signed, Donato SchultzMark , MD  09/10/2019 8:55 AM    [facility]

## 2019-09-10 NOTE — Telephone Encounter (Signed)
Informed of PCP response. He did get a name of a urologist at the ED, but for now will wait. Will discuss further with PCP if need to at his next OV

## 2019-09-10 NOTE — Telephone Encounter (Signed)
Copied from [place] [phone]. Topic: General - Other >> Sep 10, 2019 10:46 AM Antonieta Iba C wrote: Reason for CRM: pt called in for assistance. Pt says that he went to the ED at  [place] for a kidney stone. Pt says that he has since passed the stone and would like to know if he should bring it in for testing ?    Please assist.

## 2019-09-10 NOTE — Telephone Encounter (Signed)
We don't do that here to my knowledge, would have to go to urology team. Sounds like ER didn't set that part up, we can refer if he needs one.  Ty.

## 2019-09-19 NOTE — Addendum Note (Signed)
Addended by: Maren Beach, Tiwana Chavis A on: 09/19/2019 03:05 PM   Modules accepted: Orders

## 2019-10-15 ENCOUNTER — Encounter: Payer: 59 | Admitting: Family Medicine

## 2019-11-07 ENCOUNTER — Other Ambulatory Visit: Payer: Self-pay

## 2019-11-08 ENCOUNTER — Ambulatory Visit (INDEPENDENT_AMBULATORY_CARE_PROVIDER_SITE_OTHER): Payer: 59 | Admitting: Family Medicine

## 2019-11-08 ENCOUNTER — Encounter: Payer: Self-pay | Admitting: Family Medicine

## 2019-11-08 VITALS — BP 120/80 | HR 75 | Temp 96.9°F | Ht 67.0 in | Wt 193.5 lb

## 2019-11-08 DIAGNOSIS — Z125 Encounter for screening for malignant neoplasm of prostate: Secondary | ICD-10-CM

## 2019-11-08 DIAGNOSIS — Z0000 Encounter for general adult medical examination without abnormal findings: Secondary | ICD-10-CM | POA: Diagnosis not present

## 2019-11-08 MED ORDER — PANTOPRAZOLE SODIUM 40 MG PO TBEC: 40.0000 mg | DELAYED_RELEASE_TABLET | Freq: Every day | ORAL | 1 refills | Status: DC

## 2019-11-08 NOTE — Progress Notes (Signed)
Chief Complaint  Patient presents with  . Annual Exam    Wants either Colonoscopy or cologuard    Well Male Jordan Hendricks is here for a complete physical.   His last physical was >1 year ago.  Current diet: in general, a "not great" diet.  Current exercise: had been doing some walking Weight trend: gained some Daytime fatigue? No. Seat belt? Yes.    Health maintenance Shingrix- Yes Colonoscopy- Yes Tetanus- Yes HIV- Yes Hep C- Yes    Past Medical History:  Diagnosis Date  . Anginal pain (HCC)   . Coronary artery disease   . Depression   . Essential hypertension 07/13/2016  . Family history of adverse reaction to anesthesia    " My Dad had bad reactions ,He would wake up & have hallucinations"  . GERD (gastroesophageal reflux disease)   . History of chicken pox   . History of colon polyps   . Hyperlipidemia       Past Surgical History:  Procedure Laterality Date  . CARDIAC CATHETERIZATION  10/07/2016  . CARDIAC CATHETERIZATION N/A 10/07/2016   Procedure: Left Heart Cath and Coronary Angiography;  Surgeon: Yvonne Kendall, MD;  Location: [facility] INVASIVE CV LAB;  Service: Cardiovascular;  Laterality: N/A;  . CARDIAC CATHETERIZATION N/A 10/07/2016   Procedure: Coronary Stent Intervention;  Surgeon: Yvonne Kendall, MD;  Location: [facility] INVASIVE CV LAB;  Service: Cardiovascular;  Laterality: N/A;  RCA  . PERCUTANEOUS CORONARY STENT INTERVENTION (PCI-S)  10/07/2016   RCA  . REFRACTIVE SURGERY Bilateral   . ROTATOR CUFF REPAIR Right 2015  . WISDOM TOOTH EXTRACTION      Medications  Current Outpatient Medications on File Prior to Visit  Medication Sig Dispense Refill  . aspirin EC 81 MG tablet Take 1 tablet (81 mg total) by mouth daily. 30 tablet 11  . buPROPion HCl ER, XL, 450 MG TB24 Take 450 mg by mouth daily.    . busPIRone (BUSPAR) 30 MG tablet Take 30 mg by mouth 2 (two) times daily.    Marland Kitchen glucosamine-chondroitin 500-400 MG tablet Take 1 tablet by mouth 3 (three)  times daily.    Marland Kitchen lisinopril (ZESTRIL) 20 MG tablet TAKE 1 TABLET BY MOUTH  DAILY 90 tablet 1  . metoprolol succinate (TOPROL-XL) 25 MG 24 hr tablet TAKE 1 TABLET BY MOUTH  DAILY 90 tablet 1  . Multiple Vitamins-Minerals (MULTIVITAMIN ADULT PO) Take 1 tablet by mouth daily.    . nitroGLYCERIN (NITROSTAT) 0.4 MG SL tablet Place 1 tablet (0.4 mg total) under the tongue every 5 (five) minutes as needed for chest pain. 25 tablet prn  . rosuvastatin (CRESTOR) 20 MG tablet TAKE 1 TABLET BY MOUTH  DAILY 90 tablet 1  . vortioxetine HBr (TRINTELLIX) 10 MG TABS tablet Take 10 mg by mouth daily.      Allergies Allergies  Allergen Reactions  . Lactose Intolerance (Gi)   . Tape Rash    Plastic tape    Family History Family History  Problem Relation Age of Onset  . Heart disease Father   . Prostate cancer Father   . Alzheimer's disease Father   . Diabetes Father   . Hypertension Father   . Diabetes Maternal Grandmother   . Alzheimer's disease Paternal Grandmother     Review of Systems: Constitutional:  no fevers Eye:  no recent significant change in vision Ear/Nose/Mouth/Throat:  Ears:  no hearing loss Nose/Mouth/Throat:  no complaints of nasal congestion, no sore throat Cardiovascular:  no chest pain, no palpitations  Respiratory:  no cough and no shortness of breath Gastrointestinal: +reflux; otherwise no abdominal pain, no change in bowel habits GU:  Male: negative for dysuria, frequency, and incontinence and negative for prostate symptoms Musculoskeletal/Extremities:  no pain, redness, or swelling of the joints Integumentary (Skin/Breast):  no abnormal skin lesions reported Neurologic:  no headaches Endocrine: No unexpected weight changes Hematologic/Lymphatic:  no abnormal bleeding  Exam BP 120/80 (BP Location: Left Arm, Patient Position: Sitting, Cuff Size: Normal)   Pulse 75   Temp (!) 96.9 F (36.1 C) (Temporal)   Ht 5\' 7"  (1.702 m)   Wt 193 lb 8 oz (87.8 kg)   SpO2 96%    BMI 30.31 kg/m  General:  well developed, well nourished, in no apparent distress Skin:  no significant moles, warts, or growths Head:  no masses, lesions, or tenderness Eyes:  pupils equal and round, sclera anicteric without injection Ears:  canals without lesions, TMs shiny without retraction, no obvious effusion, no erythema Nose:  nares patent, septum midline, mucosa normal Throat/Pharynx:  lips and gingiva without lesion; tongue and uvula midline; non-inflamed pharynx; no exudates or postnasal drainage Neck: neck supple without adenopathy, thyromegaly, or masses Cardiac: RRR, no bruits, no LE edema Lungs:  clear to auscultation, breath sounds equal bilaterally, no respiratory distress Rectal: Deferred Musculoskeletal:  symmetrical muscle groups noted without atrophy or deformity Neuro:  gait normal; deep tendon reflexes normal and symmetric Psych: well oriented with normal range of affect and appropriate judgment/insight  Assessment and Plan  Well adult exam - Plan: Comprehensive metabolic panel, Lipid panel  Screening PSA (prostate specific antigen) - Plan: PSA   Well 59 y.o. male. Counseled on diet and exercise. Counseled on risks and benefits of prostate cancer screening with PSA. The patient agrees to undergo testing. Protonix for 60 d for reflux as he starts losing weight.  Immunizations, labs, and further orders as above. Follow up in 1 yr or prn. The patient voiced understanding and agreement to the plan.  [place], DO 11/08/19 2:55 PM

## 2019-11-08 NOTE — Patient Instructions (Signed)
Give us 2-3 business days to get the results of your labs back.   Keep the diet clean and stay active.  The only lifestyle changes that have data behind them are weight loss for the overweight/obese and elevating the head of the bed. Finding out which foods/positions are triggers is important.  Let us know if you need anything.  

## 2019-11-08 NOTE — Addendum Note (Signed)
Addended by: Harley Alto on: 11/08/2019 02:57 PM   Modules accepted: Orders

## 2019-11-09 LAB — COMPREHENSIVE METABOLIC PANEL
AG Ratio: 2.1 (calc) (ref 1.0–2.5)
ALT: 30 U/L (ref 9–46)
AST: 24 U/L (ref 10–35)
Albumin: 4.6 g/dL (ref 3.6–5.1)
Alkaline phosphatase (APISO): 71 U/L (ref 35–144)
BUN: 12 mg/dL (ref 7–25)
CO2: 26 mmol/L (ref 20–32)
Calcium: 10.1 mg/dL (ref 8.6–10.3)
Chloride: 103 mmol/L (ref 98–110)
Creat: 1.16 mg/dL (ref 0.70–1.33)
Globulin: 2.2 g/dL (calc) (ref 1.9–3.7)
Glucose, Bld: 95 mg/dL (ref 65–99)
Potassium: 4.7 mmol/L (ref 3.5–5.3)
Sodium: 140 mmol/L (ref 135–146)
Total Bilirubin: 0.6 mg/dL (ref 0.2–1.2)
Total Protein: 6.8 g/dL (ref 6.1–8.1)

## 2019-11-09 LAB — LIPID PANEL
Cholesterol: 177 mg/dL (ref <200)
HDL: 47 mg/dL (ref >=40)
LDL Cholesterol (Calc): 97 mg/dL (calc)
Non-HDL Cholesterol (Calc): 130 mg/dL (calc) — ABNORMAL HIGH (ref <130)
Total CHOL/HDL Ratio: 3.8 (calc) (ref <5.0)
Triglycerides: 211 mg/dL — ABNORMAL HIGH (ref <150)

## 2019-11-09 LAB — PSA: PSA: 0.9 ng/mL (ref <=4.0)

## 2019-11-11 ENCOUNTER — Telehealth: Payer: Self-pay

## 2019-11-11 ENCOUNTER — Encounter: Payer: Self-pay | Admitting: Family Medicine

## 2019-11-11 NOTE — Telephone Encounter (Signed)
Cologuard ordered

## 2019-11-12 ENCOUNTER — Telehealth: Payer: Self-pay | Admitting: Family Medicine

## 2019-11-12 ENCOUNTER — Other Ambulatory Visit: Payer: Self-pay | Admitting: Family Medicine

## 2019-11-12 DIAGNOSIS — E7800 Pure hypercholesterolemia, unspecified: Secondary | ICD-10-CM

## 2019-11-12 DIAGNOSIS — K769 Liver disease, unspecified: Secondary | ICD-10-CM

## 2019-11-12 MED ORDER — ROSUVASTATIN CALCIUM 40 MG PO TABS: 40.0000 mg | ORAL_TABLET | Freq: Every day | ORAL | 3 refills | Status: DC

## 2019-11-12 NOTE — Telephone Encounter (Signed)
All taken care of -

## 2019-11-12 NOTE — Telephone Encounter (Signed)
Crestor dose increased. Plz sched lab appt in 6 weeks, orders have been placed. Ty.

## 2019-11-23 ENCOUNTER — Other Ambulatory Visit: Payer: Self-pay

## 2019-11-23 ENCOUNTER — Ambulatory Visit (HOSPITAL_BASED_OUTPATIENT_CLINIC_OR_DEPARTMENT_OTHER)
Admission: RE | Admit: 2019-11-23 | Discharge: 2019-11-23 | Disposition: A | Discharge status: Home / Self Care | Payer: 59 | Source: Ambulatory Visit | Attending: Family Medicine | Admitting: Family Medicine

## 2019-11-23 DIAGNOSIS — K769 Liver disease, unspecified: Secondary | ICD-10-CM | POA: Insufficient documentation

## 2019-11-23 MED ORDER — GADOBUTROL 1 MMOL/ML IV SOLN
7.5000 mL | Freq: Once | INTRAVENOUS | Status: AC | PRN
  Administered 2019-11-23: 7.5 mL via INTRAVENOUS

## 2019-11-25 ENCOUNTER — Encounter: Payer: Self-pay | Admitting: Family Medicine

## 2019-11-28 ENCOUNTER — Other Ambulatory Visit: Payer: Self-pay | Admitting: Cardiology

## 2019-11-29 LAB — EXTERNAL GENERIC LAB PROCEDURE: COLOGUARD: NEGATIVE

## 2019-11-29 LAB — COLOGUARD: COLOGUARD: NEGATIVE

## 2019-12-02 ENCOUNTER — Telehealth: Payer: Self-pay | Admitting: Family Medicine

## 2019-12-02 NOTE — Telephone Encounter (Signed)
See other phone note dated today regarding Cologuard Results.

## 2019-12-02 NOTE — Telephone Encounter (Signed)
Cologuard results received were negative [employer] maintenance Called the patient informed of results---left message to call back.

## 2019-12-02 NOTE — Telephone Encounter (Signed)
Pt returning your call about lab results  

## 2019-12-02 NOTE — Telephone Encounter (Signed)
Patient informed. 

## 2019-12-25 ENCOUNTER — Other Ambulatory Visit: Payer: Self-pay

## 2019-12-27 ENCOUNTER — Other Ambulatory Visit (INDEPENDENT_AMBULATORY_CARE_PROVIDER_SITE_OTHER): Payer: 59

## 2019-12-27 ENCOUNTER — Other Ambulatory Visit: Payer: 59

## 2019-12-27 ENCOUNTER — Other Ambulatory Visit: Payer: Self-pay

## 2019-12-27 DIAGNOSIS — E7800 Pure hypercholesterolemia, unspecified: Secondary | ICD-10-CM

## 2019-12-27 LAB — LIPID PANEL
Cholesterol: 154 mg/dL (ref 0–200)
HDL: 40.9 mg/dL (ref >39.00)
LDL Cholesterol: 78 mg/dL (ref 0–99)
NonHDL: 112.84
Total CHOL/HDL Ratio: 4
Triglycerides: 174 mg/dL — ABNORMAL HIGH (ref 0.0–149.0)
VLDL: 34.8 mg/dL (ref 0.0–40.0)

## 2019-12-27 LAB — HEPATIC FUNCTION PANEL
ALT: 32 U/L (ref 0–53)
AST: 21 U/L (ref 0–37)
Albumin: 4.5 g/dL (ref 3.5–5.2)
Alkaline Phosphatase: 83 U/L (ref 39–117)
Bilirubin, Direct: 0.1 mg/dL (ref 0.0–0.3)
Total Bilirubin: 0.5 mg/dL (ref 0.2–1.2)
Total Protein: 6.3 g/dL (ref 6.0–8.3)

## 2020-08-05 ENCOUNTER — Other Ambulatory Visit: Payer: Self-pay | Admitting: Cardiology

## 2020-09-17 ENCOUNTER — Encounter: Payer: Self-pay | Admitting: Cardiology

## 2020-09-17 ENCOUNTER — Other Ambulatory Visit: Payer: Self-pay

## 2020-09-17 ENCOUNTER — Ambulatory Visit: Payer: 59 | Admitting: Cardiology

## 2020-09-17 VITALS — BP 110/70 | HR 65 | Ht 67.0 in | Wt 172.0 lb

## 2020-09-17 DIAGNOSIS — I10 Essential (primary) hypertension: Secondary | ICD-10-CM | POA: Diagnosis not present

## 2020-09-17 DIAGNOSIS — I2510 Atherosclerotic heart disease of native coronary artery without angina pectoris: Secondary | ICD-10-CM | POA: Diagnosis not present

## 2020-09-17 DIAGNOSIS — E7800 Pure hypercholesterolemia, unspecified: Secondary | ICD-10-CM | POA: Diagnosis not present

## 2020-09-17 NOTE — Progress Notes (Signed)
Cardiology Office Note:    Date:  09/17/2020   ID:  Jordan Hendricks, DOB [birth year], MRN [id number]  PCP:  Sharlene Dory, DO  [facility] Cardiologist:  Donato Schultz, MD  [facility] Electrophysiologist:  None   Referring MD: Sharlene Dory*     History of Present Illness:    Jordan Hendricks is a 59 y.o. male here for the follow-up of coronary artery disease.  RCA stent placed 10/07/2016  Previously had a mild scar/not over arteriotomy radial site but this has improved.  Has a strong family history with father having CABG at age 30.  Practicing attorney, busy.  When walking the 4 flights of stairs at the office sometimes feels mildly winded at the top.  Has been battling with severe depression.  Undergoing transcranial magnetic therapy currently.  On FMLA.  Prior shingles in 2018.  Denies any anginal symptoms.  No shortness of breath no fevers chills nausea vomiting syncope.  Does have some mild dizziness at times when standing up.   Past Medical History:  Diagnosis Date  . Anginal pain (HCC)   . Coronary artery disease   . Depression   . Essential hypertension 07/13/2016  . Family history of adverse reaction to anesthesia    " My Dad had bad reactions ,He would wake up & have hallucinations"  . GERD (gastroesophageal reflux disease)   . History of chicken pox   . History of colon polyps   . Hyperlipidemia     Past Surgical History:  Procedure Laterality Date  . CARDIAC CATHETERIZATION  10/07/2016  . CARDIAC CATHETERIZATION N/A 10/07/2016   Procedure: Left Heart Cath and Coronary Angiography;  Surgeon: Yvonne Kendall, MD;  Location: [facility] INVASIVE CV LAB;  Service: Cardiovascular;  Laterality: N/A;  . CARDIAC CATHETERIZATION N/A 10/07/2016   Procedure: Coronary Stent Intervention;  Surgeon: Yvonne Kendall, MD;  Location: [facility] INVASIVE CV LAB;  Service: Cardiovascular;  Laterality: N/A;  RCA  . PERCUTANEOUS CORONARY STENT INTERVENTION (PCI-S)   10/07/2016   RCA  . REFRACTIVE SURGERY Bilateral   . ROTATOR CUFF REPAIR Right 2015  . WISDOM TOOTH EXTRACTION      Current Medications: Current Meds  Medication Sig  . ARIPiprazole (ABILIFY) 5 MG tablet Take 5 mg by mouth daily.  Marland Kitchen aspirin EC 81 MG tablet Take 1 tablet (81 mg total) by mouth daily.  Marland Kitchen buPROPion (WELLBUTRIN XL) 150 MG 24 hr tablet Take 300 mg by mouth daily.  . busPIRone (BUSPAR) 30 MG tablet Take 30 mg by mouth 2 (two) times daily.  Marland Kitchen glucosamine-chondroitin 500-400 MG tablet Take 1 tablet by mouth 3 (three) times daily.  Marland Kitchen lisinopril (ZESTRIL) 20 MG tablet Take 1 tablet (20 mg total) by mouth daily. Please keep upcoming appt in December with Dr. Anne Fu before anymore refills. Thank you  . Multiple Vitamins-Minerals (MULTIVITAMIN ADULT PO) Take 1 tablet by mouth daily.  . nitroGLYCERIN (NITROSTAT) 0.4 MG SL tablet Place 1 tablet (0.4 mg total) under the tongue every 5 (five) minutes as needed for chest pain.  . pantoprazole (PROTONIX) 40 MG tablet Take 1 tablet (40 mg total) by mouth daily.  . rosuvastatin (CRESTOR) 40 MG tablet Take 1 tablet (40 mg total) by mouth daily.  . TRINTELLIX 20 MG TABS tablet Take 20 mg by mouth daily.  . [DISCONTINUED] metoprolol succinate (TOPROL-XL) 25 MG 24 hr tablet Take 1 tablet (25 mg total) by mouth daily. Please keep upcoming appt in December with Dr. Anne Fu before anymore refills. Thank you  Allergies:   Lactose intolerance (gi) and Tape   Social History   Socioeconomic History  . Marital status: Married    Spouse name: Not on file  . Number of children: Not on file  . Years of education: Not on file  . Highest education level: Not on file  Occupational History  . Not on file  Tobacco Use  . Smoking status: Never Smoker  . Smokeless tobacco: Never Used  Vaping Use  . Vaping Use: Never used  Substance and Sexual Activity  . Alcohol use: Yes    Comment: social  . Drug use: No  . Sexual activity: Not on file  Other  Topics Concern  . Not on file  Social History Narrative  . Not on file   Social Determinants of Health   Financial Resource Strain: Not on file  Food Insecurity: Not on file  Transportation Needs: Not on file  Physical Activity: Not on file  Stress: Not on file  Social Connections: Not on file     Family History: The patient's family history includes Alzheimer's disease in his father and paternal grandmother; Diabetes in his father and maternal grandmother; Heart disease in his father; Hypertension in his father; Prostate cancer in his father.  ROS:   Please see the history of present illness.     All other systems reviewed and are negative.  EKGs/Labs/Other Studies Reviewed:    The following studies were reviewed today:  Cardiac catheterization 10/07/2016: 10/07/16: Cath Conclusion   FINDINGS: 1. 95% proximal RCA stenosis with competitive filling of the rPL branches via the LCx. 2. Mild to moderate, non-obstructive CAD involving the left coronary artery. 3. Pressure dampening in the LMCA and ostial RCA with catheter engagement that improved after intracoronary nitroglycerin consistent with vasospasm. 4. Normal left ventricular filling pressure. 5. Normal left ventricular contraction. 6. SuccessfulPCI to proximal RCA with placement of Promus Premier 3.0 x 16 mm drug-eluting stent post-dilated with 3.5 mm [place] balloon.  RECOMMENDATIONS: 1. Admit for overnight observation following PCI given significant vasospasm. 2. Patient loaded with prasugrel in the cath lab and will continue clopidogrel 75 mg daily for at least 6 months, ideally longer. 3. Aggressive secondary prevention and medical therapy for component of vasospasm.  Yvonne Kendall, MD     EKG:  EKG is  ordered today.  The ekg ordered today demonstrates sinus rhythm 65 no other abnormalities.  Recent Labs: 11/08/2019: BUN 12; Creat 1.16; Potassium 4.7; Sodium 140 12/27/2019: ALT 32  Recent Lipid Panel     Component Value Date/Time   CHOL 154 12/27/2019 0729   CHOL 158 08/31/2018 0836   TRIG 174.0 (H) 12/27/2019 0729   HDL 40.90 12/27/2019 0729   HDL 50 08/31/2018 0836   CHOLHDL 4 12/27/2019 0729   VLDL 34.8 12/27/2019 0729   LDLCALC 78 12/27/2019 0729   LDLCALC 97 11/08/2019 1457   LDLDIRECT 198.0 07/13/2016 0808     Risk Assessment/Calculations:       Physical Exam:    VS:  BP 110/70 (BP Location: Left Arm, Patient Position: Sitting, Cuff Size: Normal)   Pulse 65   Ht 5\' 7"  (1.702 m)   Wt 172 lb (78 kg)   SpO2 97%   BMI 26.94 kg/m     Wt Readings from Last 3 Encounters:  09/17/20 172 lb (78 kg)  11/08/19 193 lb 8 oz (87.8 kg)  09/10/19 190 lb (86.2 kg)     GEN:  Well nourished, well developed in no acute distress  HEENT: Normal NECK: No JVD; No carotid bruits LYMPHATICS: No lymphadenopathy CARDIAC: RRR, no murmurs, rubs, gallops RESPIRATORY:  Clear to auscultation without rales, wheezing or rhonchi  ABDOMEN: Soft, non-tender, non-distended MUSCULOSKELETAL:  No edema; No deformity  SKIN: Warm and dry NEUROLOGIC:  Alert and oriented x 3 PSYCHIATRIC:  Normal affect   ASSESSMENT:    1. Coronary artery disease involving native coronary artery of native heart without angina pectoris   2. Pure hypercholesterolemia   3. Essential hypertension    PLAN:    In order of problems listed above:  Coronary artery disease -RCA stent 2018 in the setting of angina -Previously had inferior T wave inversions on EKG. -Continuing with aspirin.  Completed dual antiplatelet therapy. -Previously discontinued his low-dose isosorbide due to lack of anginal symptoms.  Continuing to do well. -He did have some mild orthostatic type symptoms.  We will go ahead and discontinue his low-dose metoprolol 25 mg once a day.  Gentle taper for 3 days.  He will let me know if any symptoms arise.  Mixed hyperlipidemia -LDL 198 with Crestor LDL 59.  Excellent.  Continue, no myalgias.  Most  recent LDL 78.  Continue work on diet.  Hemoglobin 15 creatinine 1.1 potassium 4.7 ALT 32.  Essential hypertension -Well-controlled, medications reviewed.  Stopping Toprol.  Orthostatic type symptoms.  Continue with lisinopril.  Prior shingles -2018, scalp.  Had Bell's palsy.  Overall doing well.  Vaccinated.  Nephrolithiasis -Left ureteral stone.  Prior images reviewed on CT scan.  Depression -Undergoing transcranial magnetic therapy.  About 35 sessions.  Currently on FMLA        Medication Adjustments/Labs and Tests Ordered: Current medicines are reviewed at length with the patient today.  Concerns regarding medicines are outlined above.  Orders Placed This Encounter  Procedures  . EKG 12-Lead   No orders of the defined types were placed in this encounter.   Patient Instructions  Medication Instructions:   CUT YOUR TOPROL XL 25 MG TABLETS IN HALF FOR THE NEXT 3 DAYS, THEN STOP THEREAFTER.   *If you need a refill on your cardiac medications before your next appointment, please call your pharmacy*   Follow-Up: At [facility], you and your health needs are our priority.  As part of our continuing mission to provide you with exceptional heart care, we have created designated Provider Care Teams.  These Care Teams include your primary Cardiologist (physician) and Advanced Practice Providers (APPs -  Physician Assistants and Nurse Practitioners) who all work together to provide you with the care you need, when you need it.  We recommend signing up for the patient portal called "MyChart".  Sign up information is provided on this After Visit Summary.  MyChart is used to connect with patients for Virtual Visits (Telemedicine).  Patients are able to view lab/test results, encounter notes, upcoming appointments, etc.  Non-urgent messages can be sent to your provider as well.   To learn more about what you can do with MyChart, go to [email or web address].    Your next appointment:    1 year(s)  The format for your next appointment:   In Person  Provider:   Donato Schultz, MD        Signed, Donato Schultz, MD  09/17/2020 9:15 AM    [facility]

## 2020-09-17 NOTE — Patient Instructions (Signed)
Medication Instructions:   CUT YOUR TOPROL XL 25 MG TABLETS IN HALF FOR THE NEXT 3 DAYS, THEN STOP THEREAFTER.   *If you need a refill on your cardiac medications before your next appointment, please call your pharmacy*   Follow-Up: At [facility], you and your health needs are our priority.  As part of our continuing mission to provide you with exceptional heart care, we have created designated Provider Care Teams.  These Care Teams include your primary Cardiologist (physician) and Advanced Practice Providers (APPs -  Physician Assistants and Nurse Practitioners) who all work together to provide you with the care you need, when you need it.  We recommend signing up for the patient portal called "MyChart".  Sign up information is provided on this After Visit Summary.  MyChart is used to connect with patients for Virtual Visits (Telemedicine).  Patients are able to view lab/test results, encounter notes, upcoming appointments, etc.  Non-urgent messages can be sent to your provider as well.   To learn more about what you can do with MyChart, go to [email or web address].    Your next appointment:   1 year(s)  The format for your next appointment:   In Person  Provider:   Donato Schultz, MD

## 2020-10-02 ENCOUNTER — Other Ambulatory Visit: Payer: Self-pay | Admitting: Family Medicine

## 2020-10-29 ENCOUNTER — Other Ambulatory Visit: Payer: Self-pay | Admitting: Cardiology

## 2020-10-31 ENCOUNTER — Other Ambulatory Visit: Payer: Self-pay | Admitting: Cardiology

## 2021-01-11 ENCOUNTER — Ambulatory Visit (INDEPENDENT_AMBULATORY_CARE_PROVIDER_SITE_OTHER): Payer: 59 | Admitting: Family Medicine

## 2021-01-11 ENCOUNTER — Encounter: Payer: Self-pay | Admitting: Family Medicine

## 2021-01-11 ENCOUNTER — Other Ambulatory Visit: Payer: Self-pay

## 2021-01-11 VITALS — BP 132/72 | HR 90 | Temp 98.4°F | Ht 67.0 in | Wt 183.0 lb

## 2021-01-11 DIAGNOSIS — Z0000 Encounter for general adult medical examination without abnormal findings: Secondary | ICD-10-CM

## 2021-01-11 DIAGNOSIS — K219 Gastro-esophageal reflux disease without esophagitis: Secondary | ICD-10-CM | POA: Diagnosis not present

## 2021-01-11 DIAGNOSIS — Z125 Encounter for screening for malignant neoplasm of prostate: Secondary | ICD-10-CM

## 2021-01-11 DIAGNOSIS — I209 Angina pectoris, unspecified: Secondary | ICD-10-CM | POA: Diagnosis not present

## 2021-01-11 DIAGNOSIS — F324 Major depressive disorder, single episode, in partial remission: Secondary | ICD-10-CM | POA: Diagnosis not present

## 2021-01-11 DIAGNOSIS — N529 Male erectile dysfunction, unspecified: Secondary | ICD-10-CM

## 2021-01-11 MED ORDER — LISINOPRIL 20 MG PO TABS: 1.0000 | ORAL_TABLET | Freq: Every day | ORAL | 3 refills | Status: DC

## 2021-01-11 MED ORDER — SILDENAFIL CITRATE 100 MG PO TABS: 100.0000 mg | ORAL_TABLET | Freq: Every day | ORAL | 2 refills | Status: DC | PRN

## 2021-01-11 MED ORDER — ROSUVASTATIN CALCIUM 40 MG PO TABS: 40.0000 mg | ORAL_TABLET | Freq: Every day | ORAL | 2 refills | Status: DC

## 2021-01-11 MED ORDER — PANTOPRAZOLE SODIUM 40 MG PO TBEC: 40.0000 mg | DELAYED_RELEASE_TABLET | Freq: Every day | ORAL | 1 refills | Status: DC

## 2021-01-11 NOTE — Patient Instructions (Addendum)
Give [place] 2-3 business days to get the results of your labs back.   Keep the diet clean and stay active.  GoodRx for the Viagra prescription.   Let us know if you need anything.

## 2021-01-11 NOTE — Progress Notes (Signed)
Chief Complaint  Patient presents with  . Annual Exam    Well Male Jordan Hendricks is here for a complete physical.   His last physical was >1 year ago.  Current diet: in general, a "fair" diet.  Current exercise: not much recently Weight trend: stable Fatigue out of ordinary? No. Seat belt? Yes.    Health maintenance Shingrix- Yes Colonoscopy- Yes Tetanus- Yes HIV- Yes Hep C- Yes   Reflux Over the past couple months, the patient has been having issues with reflux.  He is having burning in the esophagus/chest region at night.  No specific association with meals.  He does take Tums that is helpful.  He is not taking Protonix routinely.  Erectile dysfunction The patient is having issues maintaining and obtaining an erection.  He believes it is a combination of both age and the medications he is on for his depression.  He is interested in trying a medicine to help with this.   Past Medical History:  Diagnosis Date  . Anginal pain (HCC)   . Coronary artery disease   . Depression   . Essential hypertension 07/13/2016  . Family history of adverse reaction to anesthesia    " My Dad had bad reactions ,He would wake up & have hallucinations"  . GERD (gastroesophageal reflux disease)   . History of chicken pox   . History of colon polyps   . Hyperlipidemia       Past Surgical History:  Procedure Laterality Date  . CARDIAC CATHETERIZATION  10/07/2016  . CARDIAC CATHETERIZATION N/A 10/07/2016   Procedure: Left Heart Cath and Coronary Angiography;  Surgeon: Yvonne Kendall, MD;  Location: [facility] INVASIVE CV LAB;  Service: Cardiovascular;  Laterality: N/A;  . CARDIAC CATHETERIZATION N/A 10/07/2016   Procedure: Coronary Stent Intervention;  Surgeon: Yvonne Kendall, MD;  Location: [facility] INVASIVE CV LAB;  Service: Cardiovascular;  Laterality: N/A;  RCA  . PERCUTANEOUS CORONARY STENT INTERVENTION (PCI-S)  10/07/2016   RCA  . REFRACTIVE SURGERY Bilateral   . ROTATOR CUFF REPAIR Right 2015   . WISDOM TOOTH EXTRACTION      Medications  Current Outpatient Medications on File Prior to Visit  Medication Sig Dispense Refill  . aspirin EC 81 MG tablet Take 1 tablet (81 mg total) by mouth daily. 30 tablet 11  . busPIRone (BUSPAR) 30 MG tablet Take 30 mg by mouth 2 (two) times daily.    Marland Kitchen glucosamine-chondroitin 500-400 MG tablet Take 1 tablet by mouth 3 (three) times daily.    . Melatonin 5 MG CHEW Chew 1 capsule by mouth at bedtime.    . Multiple Vitamins-Minerals (MULTIVITAMIN ADULT PO) Take 1 tablet by mouth daily.    . nitroGLYCERIN (NITROSTAT) 0.4 MG SL tablet Place 1 tablet (0.4 mg total) under the tongue every 5 (five) minutes as needed for chest pain. 25 tablet prn  . nortriptyline (PAMELOR) 25 MG capsule Take 1 capsule by mouth at bedtime.    . nortriptyline (PAMELOR) 50 MG capsule Take 2 capsules by mouth at bedtime.    . traZODone (DESYREL) 100 MG tablet Take 100 mg by mouth at bedtime.      Allergies Allergies  Allergen Reactions  . Lactose Intolerance (Gi)   . Tape Rash    Plastic tape    Family History Family History  Problem Relation Age of Onset  . Heart disease Father   . Prostate cancer Father   . Alzheimer's disease Father   . Diabetes Father   . Hypertension Father   .  Diabetes Maternal Grandmother   . Alzheimer's disease Paternal Grandmother     Review of Systems: Constitutional:  no fevers Eye:  no recent significant change in vision Ear/Nose/Mouth/Throat:  Ears:  no hearing loss Nose/Mouth/Throat:  no complaints of nasal congestion, no sore throat Cardiovascular:  no chest pain Respiratory:  no shortness of breath Gastrointestinal:  + Reflux GU:  Male: +ED Musculoskeletal/Extremities:  no joint pain Integumentary (Skin/Breast):  no abnormal skin lesions reported Neurologic:  no headaches Endocrine: No unexpected weight changes Hematologic/Lymphatic:  no abnormal bleeding  Exam BP 132/72 (BP Location: Left Arm, Patient Position:  Sitting, Cuff Size: Normal)   Pulse 90   Temp 98.4 F (36.9 C) (Oral)   Ht 5\' 7"  (1.702 m)   Wt 183 lb (83 kg)   SpO2 98%   BMI 28.66 kg/m  General:  well developed, well nourished, in no apparent distress Skin:  no significant moles, warts, or growths Head:  no masses, lesions, or tenderness Eyes:  pupils equal and round, sclera anicteric without injection Ears:  canals without lesions, TMs shiny without retraction, no obvious effusion, no erythema Nose:  nares patent, septum midline, mucosa normal Throat/Pharynx:  lips and gingiva without lesion; tongue and uvula midline; non-inflamed pharynx; no exudates or postnasal drainage Neck: neck supple without adenopathy, thyromegaly, or masses Cardiac: RRR, no bruits, no LE edema Lungs:  clear to auscultation, breath sounds equal bilaterally, no respiratory distress Rectal: Deferred Musculoskeletal:  symmetrical muscle groups noted without atrophy or deformity Neuro:  gait normal; deep tendon reflexes normal and symmetric Psych: well oriented with normal range of affect and appropriate judgment/insight  Assessment and Plan  Well adult exam - Plan: Comprehensive metabolic panel, Lipid panel  Major depressive disorder with single episode, in partial remission (HCC)  Angina pectoris (HCC), Chronic  Screening for prostate cancer - Plan: PSA  Gastroesophageal reflux disease, unspecified whether esophagitis present - Plan: pantoprazole (PROTONIX) 40 MG tablet  Erectile dysfunction, unspecified erectile dysfunction type - Plan: sildenafil (VIAGRA) 100 MG tablet   Well 60 y.o. male. Counseled on diet and exercise. Counseled on risks and benefits of prostate cancer screening with PSA. The patient agrees to undergo testing. Chronic, uncontrolled; restart Protonix.  Tums as needed.  Reflux precautions discussed. Chronic, new.  Trial Viagra.  Use GoodRx for this.  He has not used his nitro in over 2 years. Immunizations, labs, and further  orders as above. Follow up 6 mo or as needed. The patient voiced understanding and agreement to the plan.  [address], DO 01/11/21 3:01 PM

## 2021-01-12 ENCOUNTER — Other Ambulatory Visit: Payer: Self-pay | Admitting: Family Medicine

## 2021-01-12 DIAGNOSIS — R972 Elevated prostate specific antigen [PSA]: Secondary | ICD-10-CM

## 2021-01-12 LAB — COMPREHENSIVE METABOLIC PANEL
ALT: 33 U/L (ref 0–53)
AST: 30 U/L (ref 0–37)
Albumin: 4.3 g/dL (ref 3.5–5.2)
Alkaline Phosphatase: 73 U/L (ref 39–117)
BUN: 11 mg/dL (ref 6–23)
CO2: 29 mEq/L (ref 19–32)
Calcium: 9.8 mg/dL (ref 8.4–10.5)
Chloride: 103 mEq/L (ref 96–112)
Creatinine, Ser: 1.09 mg/dL (ref 0.40–1.50)
GFR: 75.2 mL/min (ref >60.00)
Glucose, Bld: 94 mg/dL (ref 70–99)
Potassium: 4.5 mEq/L (ref 3.5–5.1)
Sodium: 138 mEq/L (ref 135–145)
Total Bilirubin: 0.6 mg/dL (ref 0.2–1.2)
Total Protein: 6.5 g/dL (ref 6.0–8.3)

## 2021-01-12 LAB — LIPID PANEL
Cholesterol: 126 mg/dL (ref 0–200)
HDL: 48.1 mg/dL (ref >39.00)
LDL Cholesterol: 50 mg/dL (ref 0–99)
NonHDL: 77.6
Total CHOL/HDL Ratio: 3
Triglycerides: 140 mg/dL (ref 0.0–149.0)
VLDL: 28 mg/dL (ref 0.0–40.0)

## 2021-01-12 LAB — PSA: PSA: 1.27 ng/mL (ref 0.10–4.00)

## 2021-01-20 ENCOUNTER — Other Ambulatory Visit: Payer: Self-pay | Admitting: Family Medicine

## 2021-01-20 DIAGNOSIS — E349 Endocrine disorder, unspecified: Secondary | ICD-10-CM

## 2021-02-24 ENCOUNTER — Other Ambulatory Visit: Payer: Self-pay | Admitting: Family Medicine

## 2021-02-24 ENCOUNTER — Other Ambulatory Visit (INDEPENDENT_AMBULATORY_CARE_PROVIDER_SITE_OTHER): Payer: 59

## 2021-02-24 ENCOUNTER — Other Ambulatory Visit: Payer: Self-pay

## 2021-02-24 DIAGNOSIS — R972 Elevated prostate specific antigen [PSA]: Secondary | ICD-10-CM | POA: Diagnosis not present

## 2021-02-24 DIAGNOSIS — E349 Endocrine disorder, unspecified: Secondary | ICD-10-CM

## 2021-02-24 LAB — PSA: PSA: 1.12 ng/mL (ref 0.10–4.00)

## 2021-02-24 LAB — TESTOSTERONE: Testosterone: 266.55 ng/dL — ABNORMAL LOW (ref 300.00–890.00)

## 2021-03-03 DIAGNOSIS — K219 Gastro-esophageal reflux disease without esophagitis: Secondary | ICD-10-CM

## 2021-03-04 MED ORDER — PANTOPRAZOLE SODIUM 40 MG PO TBEC: 40.0000 mg | DELAYED_RELEASE_TABLET | Freq: Every day | ORAL | 1 refills | Status: DC

## 2021-03-09 ENCOUNTER — Other Ambulatory Visit: Payer: 59

## 2021-03-09 ENCOUNTER — Telehealth: Payer: Self-pay | Admitting: *Deleted

## 2021-03-09 NOTE — Telephone Encounter (Signed)
Caller Name Armoni Depass Caller Phone Number [phone] Patient Name Jordan Hendricks Patient DOB 11-May-2061 Call Type Message Only Information Provided Reason for Call Request to Reschedule Office Appointment Initial Comment Needs to reschedule appt for today for blood work, pls call back. Patient request to speak to RN No Disp. Time Disposition Final User 03/09/2021 7:20:31 AM General Information Provided Yes Salvatore Marvel

## 2021-03-09 NOTE — Telephone Encounter (Signed)
Called the patient and canceled lab appt for today. Rescheduled for Thursday 6/23.

## 2021-03-11 ENCOUNTER — Other Ambulatory Visit (INDEPENDENT_AMBULATORY_CARE_PROVIDER_SITE_OTHER): Payer: 59

## 2021-03-11 ENCOUNTER — Other Ambulatory Visit: Payer: Self-pay

## 2021-03-11 DIAGNOSIS — E349 Endocrine disorder, unspecified: Secondary | ICD-10-CM

## 2021-03-14 LAB — TESTOSTERONE, FREE: TESTOSTERONE FREE: 62.7 pg/mL (ref 46.0–224.0)

## 2021-03-17 IMAGING — CT CT ABD-PELV W/ CM
2 of 5 series · 15 of 46 positions shown, 17 images · IV contrast (Omnipaque)
Comparison: None

CLINICAL DATA: Diverticulitis suspected

EXAM:
CT ABDOMEN AND PELVIS WITH CONTRAST
TECHNIQUE: Multidetector CT imaging of the abdomen and pelvis was performed
using the standard protocol following bolus administration of
intravenous contrast.
CONTRAST:  100mL OMNIPAQUE IOHEXOL 300 MG/ML  SOLN

[Series 2: axial st · axial · 0.87mm/px · z∈[-656,-140]mm · 12 of 115 slices shown, 14 images]
[im 6/115  soft-tissue]
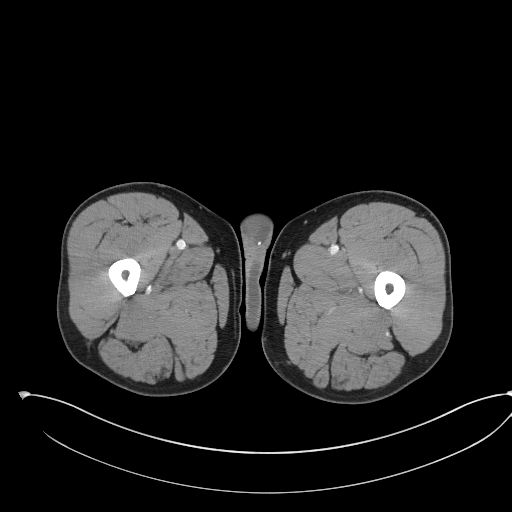
[im 6/115  bone]
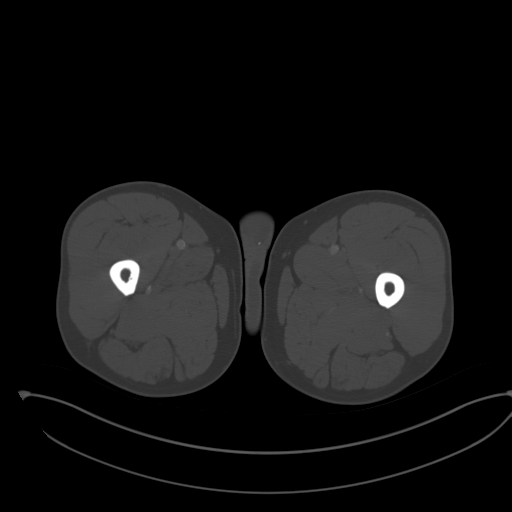
[im 18/115  soft-tissue]
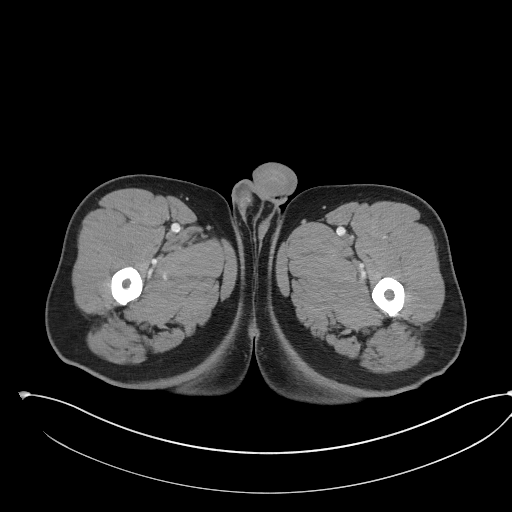
[im 23/115  soft-tissue]
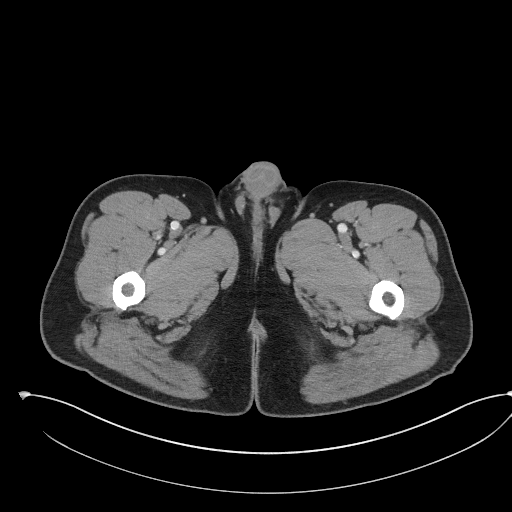
[im 35/115  soft-tissue]
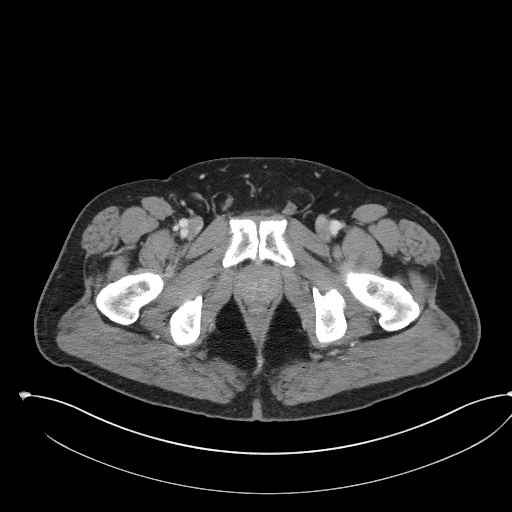
[im 46/115  soft-tissue]
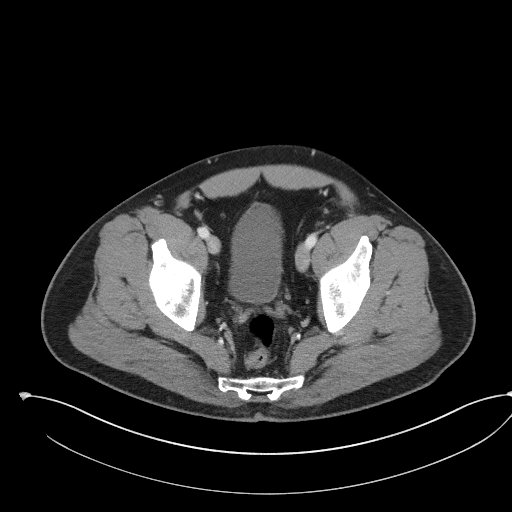
[im 52/115  soft-tissue]
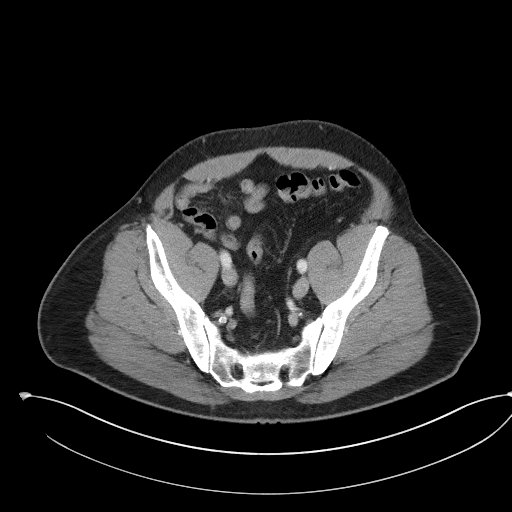
[im 63/115  soft-tissue]
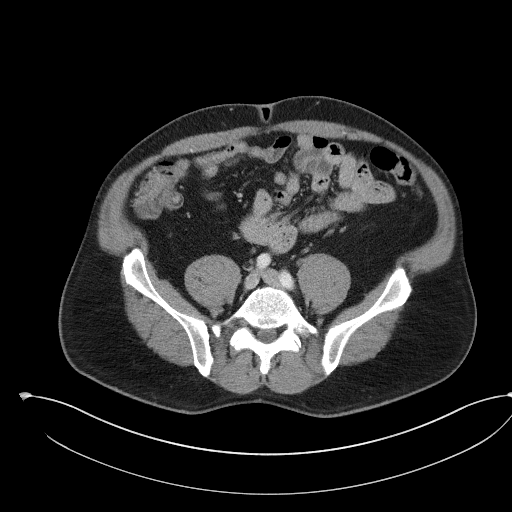
[im 69/115  soft-tissue]
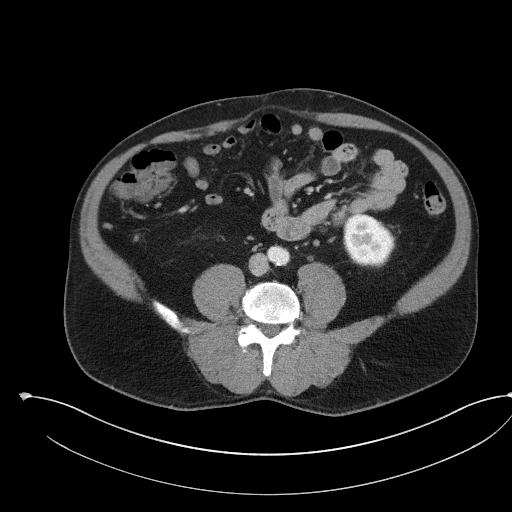
[im 80/115  soft-tissue]
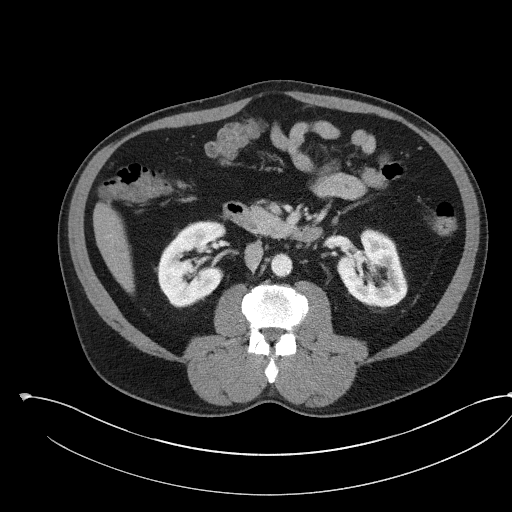
[im 80/115  bone]
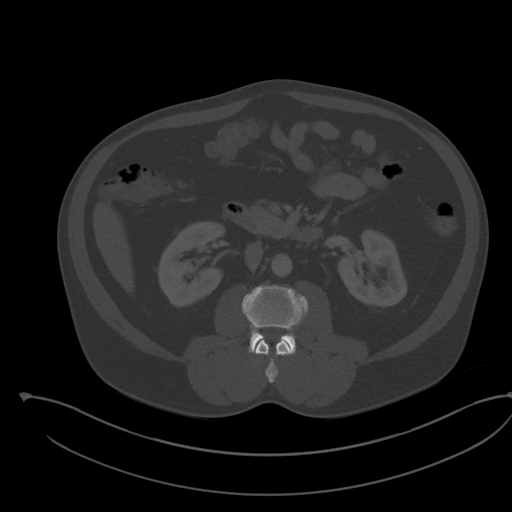
[im 92/115  soft-tissue]
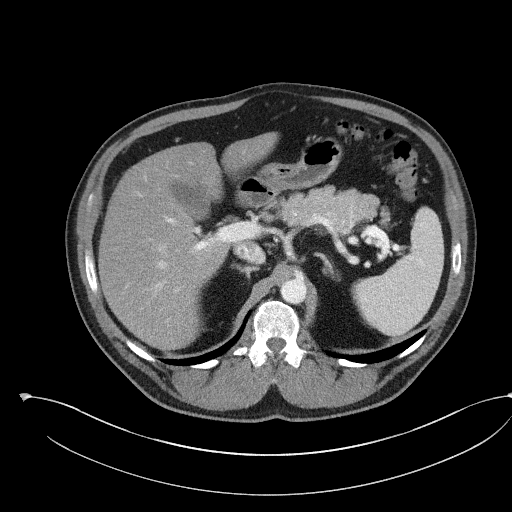
[im 97/115  soft-tissue]
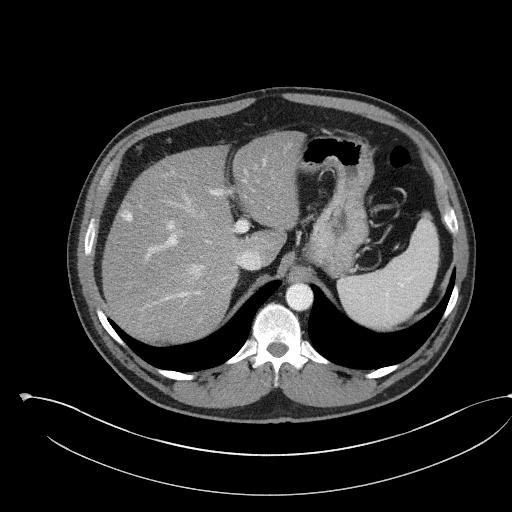
[im 109/115  soft-tissue]
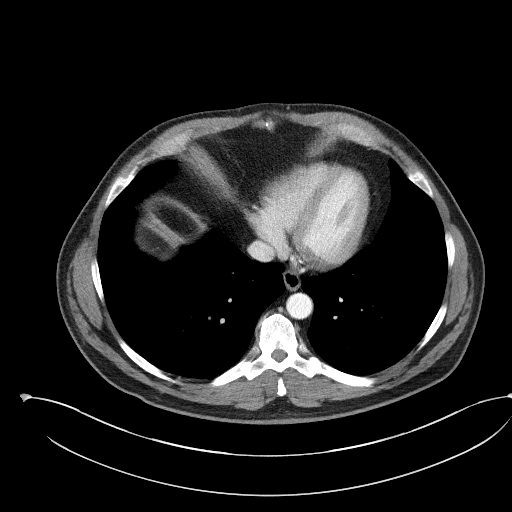

[Series 4: coronal st · coronal · 0.85mm/px · 3 of 100 slices shown]
[im 34/100  soft-tissue]
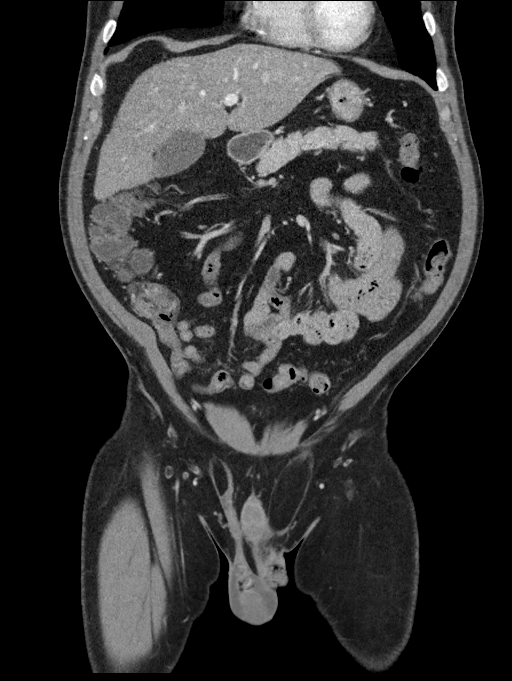
[im 45/100  soft-tissue]
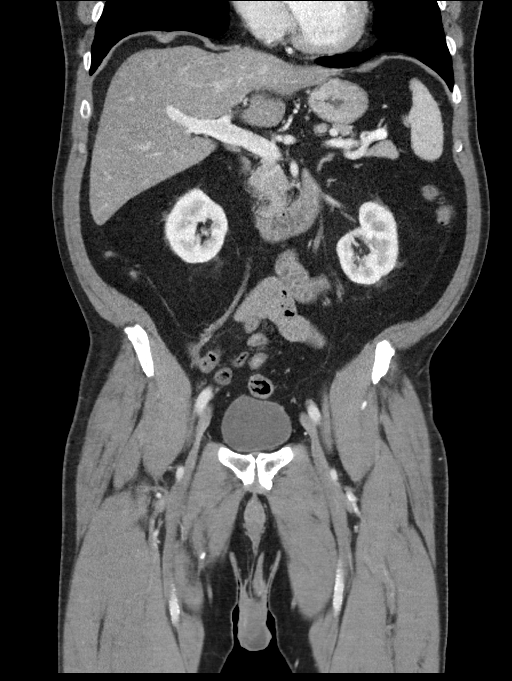
[im 56/100  soft-tissue]
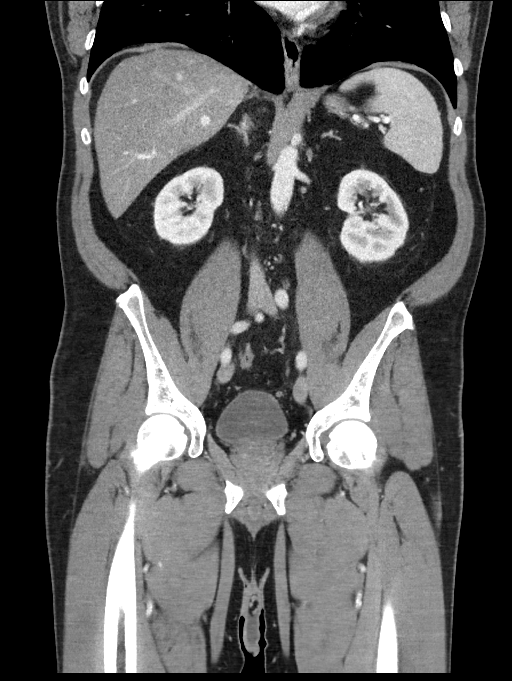

[15 of 46 positions shown; findings below may reference images not displayed]

FINDINGS: Lower chest: Lung bases are clear.

Hepatobiliary: Moderate to marked hepatic steatosis. Lobular hepatic
contours. Patent portal vein.

Hyperenhancing lesion in the right hepatic lobe peripherally 1 cm.
(Image 19, series 2)

Gallbladder is normal.  No signs of biliary ductal dilation.

Pancreas: Pancreas is normal. No signs of focal pancreatic lesion or
evidence of ductal dilation.

Spleen: Spleen is normal.

Adrenals/Urinary Tract: Adrenal glands are normal.

Bilateral renal calculi. Two on the right in the lower pole
measuring 3-4 mm. Another in the interpolar portion of the right
kidney at 2-3 mm.

A single intrarenal calculus on the left measuring 4 mm.

No signs of hydronephrosis or significant perinephric stranding.

Along the course of the distal left ureter is a single
calcification. There are calcifications elsewhere in the pelvis.
This calcification is noted at 3 mm. There may be trace left
Peri-ureteral stranding and distension. The urinary bladder is
normal.

Stomach/Bowel: CT appearance of stomach and small bowel is normal.
The appendix is normal. No sign of significant diverticulosis or
diverticulitis.

Vascular/Lymphatic: Scattered atherosclerotic calcifications. No
signs of aneurysm or acute vascular process. No adenopathy.

No sign of pelvic lymphadenopathy.

Reproductive: Prostate with calcifications.

Other: Small fat containing left inguinal hernia.

Musculoskeletal: No acute bone finding or destructive bone process.
IMPRESSION: 1. Distal left ureteral calculus without signs of hydronephrosis but
with mild left ureteral distension.
2. Hepatic steatosis with focal hypervascular hepatic lesion. This
is likely hemangioma. However, given the question of liver disease
MRI follow-up may be helpful to exclude hepatic neoplasm. Comparison
with prior imaging if available could also be performed.

## 2021-07-13 ENCOUNTER — Other Ambulatory Visit: Payer: Self-pay

## 2021-07-13 ENCOUNTER — Ambulatory Visit: Payer: 59 | Admitting: Family Medicine

## 2021-07-13 ENCOUNTER — Encounter: Payer: Self-pay | Admitting: Family Medicine

## 2021-07-13 VITALS — BP 120/83 | HR 88 | Temp 98.3°F | Ht 67.0 in | Wt 182.1 lb

## 2021-07-13 DIAGNOSIS — E7800 Pure hypercholesterolemia, unspecified: Secondary | ICD-10-CM

## 2021-07-13 DIAGNOSIS — L989 Disorder of the skin and subcutaneous tissue, unspecified: Secondary | ICD-10-CM | POA: Diagnosis not present

## 2021-07-13 DIAGNOSIS — N62 Hypertrophy of breast: Secondary | ICD-10-CM | POA: Diagnosis not present

## 2021-07-13 DIAGNOSIS — I10 Essential (primary) hypertension: Secondary | ICD-10-CM

## 2021-07-13 NOTE — Patient Instructions (Addendum)
Give [place] 2-3 business days to get the results of your labs back.   Keep the diet clean and stay active.  Aim to do some physical exertion for 150 minutes per week. This is typically divided into 5 days per week, 30 minutes per day. The activity should be enough to get your heart rate up. Anything is better than nothing if you have time constraints.  I recommend getting the updated bivalent covid vaccination booster at your convenience.   If you do not hear anything about your referral in the next 1-2 weeks, call our office and ask for an update.  Let us know if you need anything.

## 2021-07-13 NOTE — Progress Notes (Signed)
Chief Complaint  Patient presents with   Follow-up    6 month Spot on head does not heal. Right nipple soreness    Subjective Jordan Hendricks is a 60 y.o. male who presents for hypertension follow up. He does not monitor home blood pressures. He is compliant with medication- lisinopril 20 mg/d. Patient has these side effects of medication: none He is sometimes adhering to a healthy diet overall. Current exercise: none No Cp or SOB.  Hyperlipidemia Patient presents for dyslipidemia follow up. Currently being treated with Crestor 40 mg/d and compliance with treatment thus far has been good. He denies myalgias. Diet/exercise as above.  The patient is not known to have coexisting coronary artery disease.  Skin lesion on posterior mid scalp for several mo. Seems to scale and sometimes scab. Does not seem to change otherwise. No pain or itching. No drainage.   Lump behind R nipple for several mo. No pain or drainage. No hx of breast cancer.    Past Medical History:  Diagnosis Date   Anginal pain (HCC)    Coronary artery disease    Depression    Essential hypertension 07/13/2016   Family history of adverse reaction to anesthesia    " My Dad had bad reactions ,He would wake up & have hallucinations"   GERD (gastroesophageal reflux disease)    History of chicken pox    History of colon polyps    Hyperlipidemia     Exam BP 120/83   Pulse 88   Temp 98.3 F (36.8 C) (Oral)   Ht 5\' 7"  (1.702 m)   Wt 182 lb 2 oz (82.6 kg)   SpO2 98%   BMI 28.52 kg/m  General:  well developed, well nourished, in no apparent distress Skin: Small scaly patch over the occiput of the scalp with scaling, no excoriation, drainage, erythema, fluctuance; there is a prominence of breast tissue behind the right areola compared to the left without visible deformity; no nipple discharge Heart: RRR, no bruits, no LE edema Lungs: clear to auscultation, no accessory muscle use Psych: well oriented with  normal range of affect and appropriate judgment/insight  Essential hypertension  Pure hypercholesterolemia - Plan: Comprehensive metabolic panel, Lipid panel  Skin lesion of scalp - Plan: Ambulatory referral to Dermatology  Hypertrophy of male breast - Plan: MM Digital Diagnostic Unilat R  Chronic, stable. Cont lisinopril 20 mg/d.  Counseled on diet and exercise. Chronic, stable. Cont Crestor 40 mg/d. Looks like actinic keratosis.  Refer to dermatology. Check mammogram, likely gynecomastia. F/u in 6 mo for CPE or prn. The patient voiced understanding and agreement to the plan.  [place], DO 07/13/21  1:41 PM

## 2021-07-14 LAB — COMPREHENSIVE METABOLIC PANEL
ALT: 30 U/L (ref 0–53)
AST: 21 U/L (ref 0–37)
Albumin: 4.7 g/dL (ref 3.5–5.2)
Alkaline Phosphatase: 71 U/L (ref 39–117)
BUN: 9 mg/dL (ref 6–23)
CO2: 29 mEq/L (ref 19–32)
Calcium: 9.9 mg/dL (ref 8.4–10.5)
Chloride: 102 mEq/L (ref 96–112)
Creatinine, Ser: 1.01 mg/dL (ref 0.40–1.50)
GFR: 82.12 mL/min (ref >60.00)
Glucose, Bld: 109 mg/dL — ABNORMAL HIGH (ref 70–99)
Potassium: 4.3 mEq/L (ref 3.5–5.1)
Sodium: 139 mEq/L (ref 135–145)
Total Bilirubin: 0.6 mg/dL (ref 0.2–1.2)
Total Protein: 6.8 g/dL (ref 6.0–8.3)

## 2021-07-14 LAB — LIPID PANEL
Cholesterol: 142 mg/dL (ref 0–200)
HDL: 49.3 mg/dL (ref >39.00)
LDL Cholesterol: 58 mg/dL (ref 0–99)
NonHDL: 92.9
Total CHOL/HDL Ratio: 3
Triglycerides: 174 mg/dL — ABNORMAL HIGH (ref 0.0–149.0)
VLDL: 34.8 mg/dL (ref 0.0–40.0)

## 2021-07-20 ENCOUNTER — Other Ambulatory Visit: Payer: Self-pay | Admitting: Family Medicine

## 2021-07-20 DIAGNOSIS — N62 Hypertrophy of breast: Secondary | ICD-10-CM

## 2021-07-29 ENCOUNTER — Telehealth: Payer: Self-pay | Admitting: Family Medicine

## 2021-07-29 NOTE — Telephone Encounter (Signed)
Pt spouse contacted [place] regarding recent referral to [facility]iology. Pt contacted radiology to schedule an appointment. However, pt can not been seen until Middle/End December and pt spouse is very concerned about the lump on pt breast. If possible could a referral be sent elsewhere. Please advise.

## 2021-08-08 ENCOUNTER — Encounter: Payer: Self-pay | Admitting: Family Medicine

## 2021-08-08 DIAGNOSIS — K219 Gastro-esophageal reflux disease without esophagitis: Secondary | ICD-10-CM

## 2021-08-09 MED ORDER — PANTOPRAZOLE SODIUM 40 MG PO TBEC: 40.0000 mg | DELAYED_RELEASE_TABLET | Freq: Every day | ORAL | 1 refills | Status: DC

## 2021-08-10 ENCOUNTER — Ambulatory Visit: Payer: 59 | Attending: Internal Medicine

## 2021-08-10 DIAGNOSIS — Z23 Encounter for immunization: Secondary | ICD-10-CM

## 2021-08-10 NOTE — Progress Notes (Signed)
   Covid-19 Vaccination Clinic  Name:  Jordan Hendricks    MRN: [id number] DOB: [birth year]  08/10/2021  Mr. [name] was observed post Covid-19 immunization for 15 minutes without incident. He was provided with Vaccine Information Sheet and instruction to access the V-Safe system.   Mr. [name] was instructed to call 911 with any severe reactions post vaccine: Difficulty breathing  Swelling of face and throat  A fast heartbeat  A bad rash all over body  Dizziness and weakness   Immunizations Administered     Name Date Dose VIS Date Route   Moderna Covid-19 vaccine Bivalent Booster 08/10/2021  3:03 PM 0.5 mL 05/01/2021 Intramuscular   Manufacturer: Gala Murdoch   Lot: [id number]   NDC: 91694-503-88

## 2021-08-21 ENCOUNTER — Other Ambulatory Visit (HOSPITAL_BASED_OUTPATIENT_CLINIC_OR_DEPARTMENT_OTHER): Payer: Self-pay

## 2021-08-21 MED ORDER — MODERNA COVID-19 BIVAL BOOSTER 50 MCG/0.5ML IM SUSP
INTRAMUSCULAR | 0 refills | Status: DC
  Filled 2021-08-21: qty 0.5, 1d supply, fill #0

## 2021-08-23 ENCOUNTER — Other Ambulatory Visit (HOSPITAL_BASED_OUTPATIENT_CLINIC_OR_DEPARTMENT_OTHER): Payer: Self-pay

## 2021-08-24 ENCOUNTER — Other Ambulatory Visit: Payer: Self-pay | Admitting: Family Medicine

## 2021-08-25 ENCOUNTER — Other Ambulatory Visit: Payer: Self-pay | Admitting: Family Medicine

## 2021-08-25 ENCOUNTER — Ambulatory Visit
Admission: RE | Admit: 2021-08-25 | Discharge: 2021-08-25 | Disposition: A | Discharge status: Home / Self Care | Payer: 59 | Source: Ambulatory Visit | Attending: Family Medicine | Admitting: Family Medicine

## 2021-08-25 ENCOUNTER — Ambulatory Visit: Payer: 59

## 2021-08-25 DIAGNOSIS — N62 Hypertrophy of breast: Secondary | ICD-10-CM

## 2021-09-05 ENCOUNTER — Other Ambulatory Visit: Payer: Self-pay | Admitting: Family Medicine

## 2021-09-05 DIAGNOSIS — K219 Gastro-esophageal reflux disease without esophagitis: Secondary | ICD-10-CM

## 2021-09-17 ENCOUNTER — Encounter: Payer: Self-pay | Admitting: Family Medicine

## 2021-10-04 NOTE — Progress Notes (Signed)
Cardiology Office Note:    Date:  10/05/2021   ID:  Jordan Hendricks, DOB 20-Jun-1961, MRN [id number]  PCP:  Shelda Pal, DO   [facility] Providers Cardiologist:  Candee Furbish, MD  Referring MD: Shelda Pal*    Chief complaint: 1 year follow-up CAD  History of Present Illness:    Jordan Hendricks is a 61 y.o. male with a hx of HTN, CAD with angina, family history of early CAD, hyperlipidemia, nephrolithiasis,depression, and gynecomastia.   His cardiac history is significant for referral to our office in January 2018 for escalating angina. He was found to have T-wave inversions on EKG in the inferior lateral leads, dyspnea on exertion and teeth/jaw pain with exertion and underwent LHC. He was found to have 95% proximal RCA stenosis with competitive filling of the rPL branches via the LCx, successful PCi to pRCA with placement of DES.    He was last seen in our office on 09/17/20 by Dr. Marlou Porch. Metoprolol was d/ced due to mild orthostatic symptoms. One year follow-up was recommended.   Today, he is here with his wife and reports he is doing well. He denies chest pain, shortness of breath, lower extremity edema, fatigue, palpitations, melena, hematuria, hemoptysis, diaphoresis, weakness, presyncope, syncope, orthopnea, and PND. His heart rate has increased over the last year since stopping metoprolol per chart review.  He has not been monitoring HR or BP at home. Is not exercising regularly and admits to drinking multiple caffeinated beverages daily including soda and coffee.  He denies specific cardiac concerns today.   Past Medical History:  Diagnosis Date   Anginal pain ([place])    Coronary artery disease    Depression    Essential hypertension 07/13/2016   Family history of adverse reaction to anesthesia    " My Dad had bad reactions ,He would wake up & have hallucinations"   GERD (gastroesophageal reflux disease)    History of chicken pox    History of  colon polyps    Hyperlipidemia     Past Surgical History:  Procedure Laterality Date   CARDIAC CATHETERIZATION  10/07/2016   CARDIAC CATHETERIZATION N/A 10/07/2016   Procedure: Left Heart Cath and Coronary Angiography;  Surgeon: Nelva Bush, MD;  Location: [place] [facility];  Service: Cardiovascular;  Laterality: N/A;   CARDIAC CATHETERIZATION N/A 10/07/2016   Procedure: Coronary Stent Intervention;  Surgeon: Nelva Bush, MD;  Location: [place] [facility];  Service: Cardiovascular;  Laterality: N/A;  RCA   PERCUTANEOUS CORONARY STENT INTERVENTION (PCI-S)  10/07/2016   RCA   REFRACTIVE SURGERY Bilateral    ROTATOR CUFF REPAIR Right 2015   WISDOM TOOTH EXTRACTION      Current Medications: Current Meds  Medication Sig   aspirin EC 81 MG tablet Take 1 tablet (81 mg total) by mouth daily.   busPIRone (BUSPAR) 30 MG tablet Take 30 mg by mouth 2 (two) times daily.   COVID-19 mRNA bivalent vaccine, Moderna, (MODERNA COVID-19 BIVAL BOOSTER) 50 MCG/0.5ML injection Inject into the muscle.   glucosamine-chondroitin 500-400 MG tablet Take 1 tablet by mouth 2 (two) times daily.   lisinopril (ZESTRIL) 20 MG tablet Take 1 tablet (20 mg total) by mouth daily.   Melatonin 5 MG CHEW Chew 1 capsule by mouth at bedtime.   Multiple Vitamins-Minerals (MULTIVITAMIN ADULT PO) Take 1 tablet by mouth daily.   nitroGLYCERIN (NITROSTAT) 0.4 MG SL tablet Place 1 tablet (0.4 mg total) under the tongue every 5 (five) minutes as needed for chest  pain.   nortriptyline (PAMELOR) 25 MG capsule Take 1 capsule by mouth at bedtime.   nortriptyline (PAMELOR) 50 MG capsule Take 2 capsules by mouth at bedtime.   pantoprazole (PROTONIX) 40 MG tablet TAKE 1 TABLET(40 MG) BY MOUTH DAILY   rosuvastatin (CRESTOR) 40 MG tablet TAKE 1 TABLET BY MOUTH  DAILY   sildenafil (VIAGRA) 100 MG tablet Take 1 tablet (100 mg total) by mouth daily as needed for erectile dysfunction.   traZODone (DESYREL) 100 MG tablet Take 100 mg by  mouth at bedtime.   VITAMIN D, CHOLECALCIFEROL, PO Take by mouth.     Allergies:   Lactose intolerance (gi) and Tape   Social History   Socioeconomic History   Marital status: Married    Spouse name: Not on file   Number of children: Not on file   Years of education: Not on file   Highest education level: Not on file  Occupational History   Not on file  Tobacco Use   Smoking status: Never   Smokeless tobacco: Never  Vaping Use   Vaping Use: Never used  Substance and Sexual Activity   Alcohol use: Yes    Comment: social   Drug use: No   Sexual activity: Not on file  Other Topics Concern   Not on file  Social History Narrative   Not on file   Social Determinants of Health   Financial Resource Strain: Not on file  Food Insecurity: Not on file  Transportation Needs: Not on file  Physical Activity: Not on file  Stress: Not on file  Social Connections: Not on file     Family History: The patient's family history includes Alzheimer's disease in his father and paternal grandmother; Diabetes in his father and maternal grandmother; Heart disease in his father; Hypertension in his father; Prostate cancer in his father. There is no history of Breast cancer.  ROS:   Please see the history of present illness.  All other systems reviewed and are negative.  Labs/Other Studies Reviewed:    The following studies were reviewed today:  LHC 1/18  FINDINGS: 95% proximal RCA stenosis with competitive filling of the rPL branches via the LCx. Mild to moderate, non-obstructive CAD involving the left coronary artery. Pressure dampening in the LMCA and ostial RCA with catheter engagement that improved after intracoronary nitroglycerin consistent with vasospasm. Normal left ventricular filling pressure. Normal left ventricular contraction. Successful PCI to proximal RCA with placement of Promus Premier 3.0 x 16 mm drug-eluting stent post-dilated with 3.5 mm  balloon.    RECOMMENDATIONS: Admit for overnight observation following PCI given significant vasospasm. Patient loaded with prasugrel in the cath lab and will continue clopidogrel 75 mg daily for at least 6 months, ideally longer. Aggressive secondary prevention and medical therapy for component of vasospasm.     Recent Labs: 07/13/2021: ALT 30; BUN 9; Creatinine, Ser 1.01; Potassium 4.3; Sodium 139  Recent Lipid Panel    Component Value Date/Time   CHOL 142 07/13/2021 1317   CHOL 158 08/31/2018 0836   TRIG 174.0 (H) 07/13/2021 1317   HDL 49.30 07/13/2021 1317   HDL 50 08/31/2018 0836   CHOLHDL 3 07/13/2021 1317   VLDL 34.8 07/13/2021 1317   LDLCALC 58 07/13/2021 1317   LDLCALC 97 11/08/2019 1457   LDLDIRECT 198.0 07/13/2016 0808      Physical Exam:    VS:  BP 120/80    Pulse 95    Ht 5\' 7"  (1.702 m)    Wt  189 lb 12.8 oz (86.1 kg)    SpO2 97%    BMI 29.73 kg/m     Wt Readings from Last 3 Encounters:  10/05/21 189 lb 12.8 oz (86.1 kg)  07/13/21 182 lb 2 oz (82.6 kg)  01/11/21 183 lb (83 kg)     GEN: Well nourished, well developed in no acute distress HEENT: Normal NECK: No JVD; No carotid bruits LYMPHATICS: No lymphadenopathy CARDIAC: RRR, no murmurs, rubs, gallops RESPIRATORY:  Clear to auscultation without rales, wheezing or rhonchi  ABDOMEN: Soft, non-tender, non-distended MUSCULOSKELETAL:  No edema; No deformity. 2+ pedal pulses equal bilaterally SKIN: Warm and dry NEUROLOGIC:  Alert and oriented x 3 PSYCHIATRIC:  Normal affect   EKG:  EKG is ordered today.  The ekg ordered today demonstrates NSR at rate of 95 bpm. No ST/T wave abnormality. No acute changes from previous.   Diagnoses:    1. Coronary artery disease involving native coronary artery of native heart without angina pectoris   2. Hyperlipidemia LDL goal <70   3. Essential hypertension    Assessment and Plan:     Essential hypertension: Metoprolol was discontinued 12/21 in the setting of mild  orthostasis. Blood pressure is stable today. He does not monitor at home. HR was generally 60-80 bpm prior to 4/22. Since that time, documented HR in EMR is 88-95 bpm. I have asked him to monitor blood pressure and heart rate at home and call back to report consistent HR > 90 bpm.  Encouraged reduction and caffeinated beverages and increase in water consumption.  Also encouraged regular moderate physical exercise. Would favor addition of low dose beta blocker and potential reduction in lisinopril dose if HR remains elevated despite changes in lifestyle. Continue lisinopril.   CAD without angina: He denies chest pain, dyspnea, or other symptoms concerning for angina.  He has not been active on a regular basis but is agreeable to start walking for exercise.  Denies bleeding concerns on aspirin.  Continue aspirin, statin.  Hyperlipidemia LDL goal < 70: LDL 58 07/13/21.  Information on Mediterranean diet given. Continue rosuvastatin.  Disposition: 1 year with Dr. Marlou Porch      Medication Adjustments/Labs and Tests Ordered: Current medicines are reviewed at length with the patient today.  Concerns regarding medicines are outlined above.  Orders Placed This Encounter  Procedures   EKG 12-Lead   No orders of the defined types were placed in this encounter.   Patient Instructions  Medication Instructions:   Your physician recommends that you continue on your current medications as directed. Please refer to the Current Medication list given to you today.  *If you need a refill on your cardiac medications before your next appointment, please call your pharmacy*   Lab Work:  None ordered. If you have labs (blood work) drawn today and your tests are completely normal, you will receive your results only by: [place] (if you have MyChart) OR A paper copy in the mail If you have any lab test that is abnormal or we need to change your treatment, we will call you to review the  results.   Testing/Procedures:  None ordered.    Follow-Up: At [facility], you and your health needs are our priority.  As part of our continuing mission to provide you with exceptional heart care, we have created designated Provider Care Teams.  These Care Teams include your primary Cardiologist (physician) and Advanced Practice Providers (APPs -  Physician Assistants and Nurse Practitioners) who all work together  to provide you with the care you need, when you need it.  We recommend signing up for the patient portal called "MyChart".  Sign up information is provided on this After Visit Summary.  MyChart is used to connect with patients for Virtual Visits (Telemedicine).  Patients are able to view lab/test results, encounter notes, upcoming appointments, etc.  Non-urgent messages can be sent to your provider as well.   To learn more about what you can do with MyChart, go to [email or web address].    Your next appointment:   1 year(s)  The format for your next appointment:   In Person  Provider:   Candee Furbish, MD     Other Instructions Your physician wants you to follow-up in: 1 year with Dr.Skains.  You will receive a reminder letter in the mail two months in advance. If you don't receive a letter, please call our office to schedule the follow-up appointment.  Mediterranean Diet A Mediterranean diet refers to food and lifestyle choices that are based on the traditions of countries located on the [employer]. It focuses on eating more fruits, vegetables, whole grains, beans, nuts, seeds, and heart-healthy fats, and eating less dairy, meat, eggs, and processed foods with added sugar, salt, and fat. This way of eating has been shown to help prevent certain conditions and improve outcomes for people who have chronic diseases, like kidney disease and heart disease. What are tips for following this plan? Reading food labels Check the serving size of packaged foods. For foods  such as rice and pasta, the serving size refers to the amount of cooked product, not dry. Check the total fat in packaged foods. Avoid foods that have saturated fat or trans fats. Check the ingredient list for added sugars, such as corn syrup. Shopping  Buy a variety of foods that offer a balanced diet, including: Fresh fruits and vegetables (produce). Grains, beans, nuts, and seeds. Some of these may be available in unpackaged forms or large amounts (in bulk). Fresh seafood. Poultry and eggs. Low-fat dairy products. Buy whole ingredients instead of prepackaged foods. Buy fresh fruits and vegetables in-season from local farmers markets. Buy plain frozen fruits and vegetables. If you do not have access to quality fresh seafood, buy precooked frozen shrimp or canned fish, such as tuna, salmon, or sardines. Stock your pantry so you always have certain foods on hand, such as olive oil, canned tuna, canned tomatoes, rice, pasta, and beans. Cooking Cook foods with extra-virgin olive oil instead of using butter or other vegetable oils. Have meat as a side dish, and have vegetables or grains as your main dish. This means having meat in small portions or adding small amounts of meat to foods like pasta or stew. Use beans or vegetables instead of meat in common dishes like chili or lasagna. Experiment with different cooking methods. Try roasting, broiling, steaming, and sauting vegetables. Add frozen vegetables to soups, stews, pasta, or rice. Add nuts or seeds for added healthy fats and plant protein at each meal. You can add these to yogurt, salads, or vegetable dishes. Marinate fish or vegetables using olive oil, lemon juice, garlic, and fresh herbs. Meal planning Plan to eat one vegetarian meal one day each week. Try to work up to two vegetarian meals, if possible. Eat seafood two or more times a week. Have healthy snacks readily available, such as: Vegetable sticks with hummus. Greek  yogurt. Fruit and nut trail mix. Eat balanced meals throughout the week. This includes: Fruit: 2-3 servings  a day. Vegetables: 4-5 servings a day. Low-fat dairy: 2 servings a day. Fish, poultry, or lean meat: 1 serving a day. Beans and legumes: 2 or more servings a week. Nuts and seeds: 1-2 servings a day. Whole grains: 6-8 servings a day. Extra-virgin olive oil: 3-4 servings a day. Limit red meat and sweets to only a few servings a month. Lifestyle  Cook and eat meals together with your family, when possible. Drink enough fluid to keep your urine pale yellow. Be physically active every day. This includes: Aerobic exercise like running or swimming. Leisure activities like gardening, walking, or housework. Get 7-8 hours of sleep each night. If recommended by your health care provider, drink red wine in moderation. This means 1 glass a day for nonpregnant women and 2 glasses a day for men. A glass of wine equals 5 oz (150 mL). What foods should I eat? Fruits Apples. Apricots. Avocado. Berries. Bananas. Cherries. Dates. Figs. Grapes. Lemons. Melon. Oranges. Peaches. Plums. Pomegranate. Vegetables Artichokes. Beets. Broccoli. Cabbage. Carrots. Eggplant. Green beans. Chard. Kale. Spinach. Onions. Leeks. Peas. Squash. Tomatoes. Peppers. Radishes. Grains Whole-grain pasta. Brown rice. Bulgur wheat. Polenta. Couscous. Whole-wheat bread. [place]. Meats and other proteins Beans. Almonds. Sunflower seeds. Pine nuts. Peanuts. [place]. Salmon. Scallops. Shrimp. [place]. Tilapia. Clams. Oysters. Eggs. Poultry without skin. Dairy Low-fat milk. Cheese. Greek yogurt. Fats and oils Extra-virgin olive oil. Avocado oil. Grapeseed oil. Beverages Water. Red wine. Herbal tea. Sweets and desserts Greek yogurt with honey. Baked apples. Poached pears. Trail mix. Seasonings and condiments Basil. Cilantro. Coriander. Cumin. Mint. Parsley. Sage. Rosemary. Tarragon. Garlic. Oregano. Thyme. Pepper. Balsamic  vinegar. Tahini. Hummus. Tomato sauce. Olives. Mushrooms. The items listed above may not be a complete list of foods and beverages you can eat. Contact a dietitian for more information. What foods should I limit? This is a list of foods that should be eaten rarely or only on special occasions. Fruits Fruit canned in syrup. Vegetables Deep-fried potatoes (french fries). Grains Prepackaged pasta or rice dishes. Prepackaged cereal with added sugar. Prepackaged snacks with added sugar. Meats and other proteins Beef. Pork. Lamb. Poultry with skin. Hot dogs. Berniece Salines. Dairy Ice cream. Sour cream. Whole milk. Fats and oils Butter. Canola oil. Vegetable oil. Beef fat (tallow). Lard. Beverages Juice. Sugar-sweetened soft drinks. Beer. Liquor and spirits. Sweets and desserts Cookies. Cakes. Pies. Candy. Seasonings and condiments Mayonnaise. Pre-made sauces and marinades. The items listed above may not be a complete list of foods and beverages you should limit. Contact a dietitian for more information. Summary The Mediterranean diet includes both food and lifestyle choices. Eat a variety of fresh fruits and vegetables, beans, nuts, seeds, and whole grains. Limit the amount of red meat and sweets that you eat. If recommended by your health care provider, drink red wine in moderation. This means 1 glass a day for nonpregnant women and 2 glasses a day for men. A glass of wine equals 5 oz (150 mL). This information is not intended to replace advice given to you by your health care provider. Make sure you discuss any questions you have with your health care provider. Document Revised: 10/11/2019 Document Reviewed: 08/08/2019 Elsevier Patient Education  2022 [place].   Please report Heart Rate and blood pressure if your Heart Rate consistently is over 90 beats per minute.          Signed, Emmaline Life, NP  10/05/2021 1:32 PM    [facility]

## 2021-10-05 ENCOUNTER — Encounter: Payer: Self-pay | Admitting: Nurse Practitioner

## 2021-10-05 ENCOUNTER — Ambulatory Visit: Payer: 59 | Admitting: Nurse Practitioner

## 2021-10-05 ENCOUNTER — Other Ambulatory Visit: Payer: Self-pay

## 2021-10-05 VITALS — BP 120/80 | HR 95 | Ht 67.0 in | Wt 189.8 lb

## 2021-10-05 DIAGNOSIS — I10 Essential (primary) hypertension: Secondary | ICD-10-CM

## 2021-10-05 DIAGNOSIS — E785 Hyperlipidemia, unspecified: Secondary | ICD-10-CM | POA: Diagnosis not present

## 2021-10-05 DIAGNOSIS — I2510 Atherosclerotic heart disease of native coronary artery without angina pectoris: Secondary | ICD-10-CM | POA: Diagnosis not present

## 2021-10-05 NOTE — Patient Instructions (Addendum)
Medication Instructions:   Your physician recommends that you continue on your current medications as directed. Please refer to the Current Medication list given to you today.  *If you need a refill on your cardiac medications before your next appointment, please call your pharmacy*   Lab Work:  None ordered. If you have labs (blood work) drawn today and your tests are completely normal, you will receive your results only by: MyChart Message (if you have MyChart) OR A paper copy in the mail If you have any lab test that is abnormal or we need to change your treatment, we will call you to review the results.   Testing/Procedures:  None ordered.    Follow-Up: At [facility], you and your health needs are our priority.  As part of our continuing mission to provide you with exceptional heart care, we have created designated Provider Care Teams.  These Care Teams include your primary Cardiologist (physician) and Advanced Practice Providers (APPs -  Physician Assistants and Nurse Practitioners) who all work together to provide you with the care you need, when you need it.  We recommend signing up for the patient portal called "MyChart".  Sign up information is provided on this After Visit Summary.  MyChart is used to connect with patients for Virtual Visits (Telemedicine).  Patients are able to view lab/test results, encounter notes, upcoming appointments, etc.  Non-urgent messages can be sent to your provider as well.   To learn more about what you can do with MyChart, go to [email or web address].    Your next appointment:   1 year(s)  The format for your next appointment:   In Person  Provider:   Donato SchultzMark Skains, MD     Other Instructions Your physician wants you to follow-up in: 1 year with Dr.Skains.  You will receive a reminder letter in the mail two months in advance. If you don't receive a letter, please call our office to schedule the follow-up appointment.  Mediterranean  Diet A Mediterranean diet refers to food and lifestyle choices that are based on the traditions of countries located on the [place]. It focuses on eating more fruits, vegetables, whole grains, beans, nuts, seeds, and heart-healthy fats, and eating less dairy, meat, eggs, and processed foods with added sugar, salt, and fat. This way of eating has been shown to help prevent certain conditions and improve outcomes for people who have chronic diseases, like kidney disease and heart disease. What are tips for following this plan? Reading food labels Check the serving size of packaged foods. For foods such as rice and pasta, the serving size refers to the amount of cooked product, not dry. Check the total fat in packaged foods. Avoid foods that have saturated fat or trans fats. Check the ingredient list for added sugars, such as corn syrup. Shopping  Buy a variety of foods that offer a balanced diet, including: Fresh fruits and vegetables (produce). Grains, beans, nuts, and seeds. Some of these may be available in unpackaged forms or large amounts (in bulk). Fresh seafood. Poultry and eggs. Low-fat dairy products. Buy whole ingredients instead of prepackaged foods. Buy fresh fruits and vegetables in-season from local farmers markets. Buy plain frozen fruits and vegetables. If you do not have access to quality fresh seafood, buy precooked frozen shrimp or canned fish, such as tuna, salmon, or sardines. Stock your pantry so you always have certain foods on hand, such as olive oil, canned tuna, canned tomatoes, rice, pasta, and beans. Cooking Cook foods with  extra-virgin olive oil instead of using butter or other vegetable oils. Have meat as a side dish, and have vegetables or grains as your main dish. This means having meat in small portions or adding small amounts of meat to foods like pasta or stew. Use beans or vegetables instead of meat in common dishes like chili or lasagna. Experiment  with different cooking methods. Try roasting, broiling, steaming, and sauting vegetables. Add frozen vegetables to soups, stews, pasta, or rice. Add nuts or seeds for added healthy fats and plant protein at each meal. You can add these to yogurt, salads, or vegetable dishes. Marinate fish or vegetables using olive oil, lemon juice, garlic, and fresh herbs. Meal planning Plan to eat one vegetarian meal one day each week. Try to work up to two vegetarian meals, if possible. Eat seafood two or more times a week. Have healthy snacks readily available, such as: Vegetable sticks with hummus. Greek yogurt. Fruit and nut trail mix. Eat balanced meals throughout the week. This includes: Fruit: 2-3 servings a day. Vegetables: 4-5 servings a day. Low-fat dairy: 2 servings a day. Fish, poultry, or lean meat: 1 serving a day. Beans and legumes: 2 or more servings a week. Nuts and seeds: 1-2 servings a day. Whole grains: 6-8 servings a day. Extra-virgin olive oil: 3-4 servings a day. Limit red meat and sweets to only a few servings a month. Lifestyle  Cook and eat meals together with your family, when possible. Drink enough fluid to keep your urine pale yellow. Be physically active every day. This includes: Aerobic exercise like running or swimming. Leisure activities like gardening, walking, or housework. Get 7-8 hours of sleep each night. If recommended by your health care provider, drink red wine in moderation. This means 1 glass a day for nonpregnant women and 2 glasses a day for men. A glass of wine equals 5 oz (150 mL). What foods should I eat? Fruits Apples. Apricots. Avocado. Berries. Bananas. Cherries. Dates. Figs. Grapes. Lemons. Melon. Oranges. Peaches. Plums. Pomegranate. Vegetables Artichokes. Beets. Broccoli. Cabbage. Carrots. Eggplant. Green beans. Chard. Kale. Spinach. Onions. Leeks. Peas. Squash. Tomatoes. Peppers. Radishes. Grains Whole-grain pasta. Brown rice. Bulgur wheat.  Polenta. Couscous. Whole-wheat bread. Orpah Cobb. Meats and other proteins Beans. Almonds. Sunflower seeds. Pine nuts. Peanuts. Cod. Salmon. Scallops. Shrimp. Tuna. Tilapia. Clams. Oysters. Eggs. Poultry without skin. Dairy Low-fat milk. Cheese. Greek yogurt. Fats and oils Extra-virgin olive oil. Avocado oil. Grapeseed oil. Beverages Water. Red wine. Herbal tea. Sweets and desserts Greek yogurt with honey. Baked apples. Poached pears. Trail mix. Seasonings and condiments Basil. Cilantro. Coriander. Cumin. Mint. Parsley. Sage. Rosemary. Tarragon. Garlic. Oregano. Thyme. Pepper. Balsamic vinegar. Tahini. Hummus. Tomato sauce. Olives. Mushrooms. The items listed above may not be a complete list of foods and beverages you can eat. Contact a dietitian for more information. What foods should I limit? This is a list of foods that should be eaten rarely or only on special occasions. Fruits Fruit canned in syrup. Vegetables Deep-fried potatoes (french fries). Grains Prepackaged pasta or rice dishes. Prepackaged cereal with added sugar. Prepackaged snacks with added sugar. Meats and other proteins Beef. Pork. Lamb. Poultry with skin. Hot dogs. Tomasa Blase. Dairy Ice cream. Sour cream. Whole milk. Fats and oils Butter. Canola oil. Vegetable oil. Beef fat (tallow). Lard. Beverages Juice. Sugar-sweetened soft drinks. Beer. Liquor and spirits. Sweets and desserts Cookies. Cakes. Pies. Candy. Seasonings and condiments Mayonnaise. Pre-made sauces and marinades. The items listed above may not be a complete list of foods and  beverages you should limit. Contact a dietitian for more information. Summary The Mediterranean diet includes both food and lifestyle choices. Eat a variety of fresh fruits and vegetables, beans, nuts, seeds, and whole grains. Limit the amount of red meat and sweets that you eat. If recommended by your health care provider, drink red wine in moderation. This means 1 glass a  day for nonpregnant women and 2 glasses a day for men. A glass of wine equals 5 oz (150 mL). This information is not intended to replace advice given to you by your health care provider. Make sure you discuss any questions you have with your health care provider. Document Revised: 10/11/2019 Document Reviewed: 08/08/2019 Elsevier Patient Education  2022 Elsevier Inc.   Please report Heart Rate and blood pressure if your Heart Rate consistently is over 90 beats per minute.

## 2021-11-29 ENCOUNTER — Encounter: Payer: Self-pay | Admitting: Cardiology

## 2021-12-01 ENCOUNTER — Telehealth: Payer: Self-pay | Admitting: *Deleted

## 2021-12-01 MED ORDER — METOPROLOL SUCCINATE ER 25 MG PO TB24: 25.0000 mg | ORAL_TABLET | Freq: Every day | ORAL | 3 refills | Status: DC

## 2021-12-01 NOTE — Telephone Encounter (Signed)
Can you call that in to [facility] on [address] in [place]? ?  ? ?Jordan Bathe, MD  Juddson Reek 5 hours ago (7:15 AM)  ? ?Let's go back on low dose Toprol XL 25mg  PO QD.  ?Thanks ? , MD ?  ? ?Donato Schultz, LPN routed conversation to Alois Cliche, MD Yesterday (8:11 AM)  ? ?Nasier Jordan Hendricks  P [facility] Triage (supporting [employer], MD) 2 days ago  ? ?Dr. Jake Hendricks, ?When I came in for my annual check-up, my resting heart rate was in the 90s. The NP said to monitor it with my at home devices and see if it comes down. I have been checking it the first few months of this year and it remains in the 90s. Do I need to go back on some medication for that or do something else? ?Thanks, ?Keona ? ?Metoprolol Succinate 25 mg # 90 X 3 sent electronically into pharmacy of choice. ? ? ?

## 2021-12-22 MED ORDER — METOPROLOL SUCCINATE ER 50 MG PO TB24: 50.0000 mg | ORAL_TABLET | Freq: Every day | ORAL | 3 refills | Status: DC

## 2021-12-29 ENCOUNTER — Other Ambulatory Visit: Payer: Self-pay | Admitting: Family Medicine

## 2021-12-30 ENCOUNTER — Other Ambulatory Visit: Payer: Self-pay | Admitting: Family Medicine

## 2021-12-30 DIAGNOSIS — K219 Gastro-esophageal reflux disease without esophagitis: Secondary | ICD-10-CM

## 2022-01-24 ENCOUNTER — Telehealth (INDEPENDENT_AMBULATORY_CARE_PROVIDER_SITE_OTHER): Payer: 59 | Admitting: Family Medicine

## 2022-01-24 ENCOUNTER — Encounter: Payer: Self-pay | Admitting: Family Medicine

## 2022-01-24 VITALS — Temp 99.4°F

## 2022-01-24 DIAGNOSIS — U071 COVID-19: Secondary | ICD-10-CM | POA: Diagnosis not present

## 2022-01-24 MED ORDER — PROMETHAZINE-DM 6.25-15 MG/5ML PO SYRP: 5.0000 mL | ORAL_SOLUTION | Freq: Four times a day (QID) | ORAL | 0 refills | Status: DC | PRN

## 2022-01-24 MED ORDER — MOLNUPIRAVIR EUA 200MG CAPSULE: 4.0000 | ORAL_CAPSULE | Freq: Two times a day (BID) | ORAL | 0 refills | Status: AC

## 2022-01-24 NOTE — Progress Notes (Signed)
Chief Complaint  ?Patient presents with  ? Sore Throat  ?  Tested positive for Covid today 01/24/2022 ?Headache ?Runny nose ?Congestion ?Body aches ? ?  ? Cough  ? ? ?Jordan Hendricks here for URI complaints. Due to COVID-19 pandemic, we are interacting via web portal for an electronic face-to-face visit. I verified patient's ID using 2 identifiers. Patient agreed to proceed with visit via this method. Patient is at home, I am at office. Patient and I are present for visit.  ? ?Duration: 4 days  ?Associated symptoms: sinus headache, rhinorrhea, sore throat, myalgia, and coughing ?Denies: sinus pain, itchy watery eyes, ear pain, ear drainage, wheezing, shortness of breath, and fevers, N/V/D ?Treatment to date: Advil ?Sick contacts: None that he is aware of, just returned from a cruise ?Tested + for covid today.  ? ?Past Medical History:  ?Diagnosis Date  ? Anginal pain (HCC)   ? Coronary artery disease   ? Depression   ? Essential hypertension 07/13/2016  ? Family history of adverse reaction to anesthesia   ? " My Dad had bad reactions ,He would wake up & have hallucinations"  ? GERD (gastroesophageal reflux disease)   ? History of chicken pox   ? History of colon polyps   ? Hyperlipidemia   ? ? ?Objective ?Temp 99.4 ?F (37.4 ?C) (Oral)  ?No conversational dyspnea ?Age appropriate judgment and insight ?Nml affect and mood ? ?COVID-19 - Plan: molnupiravir EUA (LAGEVRIO) 200 mg CAPS capsule, promethazine-dextromethorphan (PROMETHAZINE-DM) 6.25-15 MG/5ML syrup ? ?Continue to push fluids, practice good hand hygiene, cover mouth when coughing. Discussed CDC quarantining guidelines. ?F/u prn. If starting to experience irreplaceable fluid loss, shaking, or shortness of breath, seek immediate care. ?Pt voiced understanding and agreement to the plan. ? ?Sharlene Dory, DO ?01/24/22 ?11:02 AM ? ?

## 2022-01-31 ENCOUNTER — Telehealth: Payer: Self-pay | Admitting: Family Medicine

## 2022-01-31 NOTE — Telephone Encounter (Signed)
Patient  informed of PCP response. ?He verbalized understanding. ?

## 2022-01-31 NOTE — Telephone Encounter (Signed)
Pt is still testing positive for covid and would like to know if there is anything he can do to help test negative so he can go back to work. Please advise.  ?

## 2022-01-31 NOTE — Telephone Encounter (Signed)
Is his work requiring him to test negative? After testing positive, we usually go by symptoms as we sometimes test positive longer than we are actually contagious, hence why the CDC recommendations do not recommend retesting to indicate cure. There is nothing he can do to get it to test negative otherwise though.  ?

## 2022-05-24 ENCOUNTER — Other Ambulatory Visit: Payer: Self-pay | Admitting: Family Medicine

## 2022-05-24 ENCOUNTER — Encounter: Payer: Self-pay | Admitting: Family Medicine

## 2022-05-24 ENCOUNTER — Ambulatory Visit (INDEPENDENT_AMBULATORY_CARE_PROVIDER_SITE_OTHER): Payer: 59 | Admitting: Family Medicine

## 2022-05-24 VITALS — BP 132/80 | HR 93 | Resp 18 | Ht 67.0 in | Wt 195.8 lb

## 2022-05-24 DIAGNOSIS — Z0000 Encounter for general adult medical examination without abnormal findings: Secondary | ICD-10-CM | POA: Diagnosis not present

## 2022-05-24 DIAGNOSIS — Z125 Encounter for screening for malignant neoplasm of prostate: Secondary | ICD-10-CM | POA: Diagnosis not present

## 2022-05-24 DIAGNOSIS — R739 Hyperglycemia, unspecified: Secondary | ICD-10-CM

## 2022-05-24 LAB — COMPREHENSIVE METABOLIC PANEL
ALT: 47 U/L (ref 0–53)
AST: 29 U/L (ref 0–37)
Albumin: 4.2 g/dL (ref 3.5–5.2)
Alkaline Phosphatase: 76 U/L (ref 39–117)
BUN: 14 mg/dL (ref 6–23)
CO2: 28 mEq/L (ref 19–32)
Calcium: 9.6 mg/dL (ref 8.4–10.5)
Chloride: 103 mEq/L (ref 96–112)
Creatinine, Ser: 1.1 mg/dL (ref 0.40–1.50)
GFR: 73.67 mL/min (ref >60.00)
Glucose, Bld: 142 mg/dL — ABNORMAL HIGH (ref 70–99)
Potassium: 4.8 mEq/L (ref 3.5–5.1)
Sodium: 140 mEq/L (ref 135–145)
Total Bilirubin: 0.4 mg/dL (ref 0.2–1.2)
Total Protein: 6.6 g/dL (ref 6.0–8.3)

## 2022-05-24 LAB — LIPID PANEL
Cholesterol: 144 mg/dL (ref 0–200)
HDL: 38.4 mg/dL — ABNORMAL LOW (ref >39.00)
NonHDL: 105.96
Total CHOL/HDL Ratio: 4
Triglycerides: 274 mg/dL — ABNORMAL HIGH (ref 0.0–149.0)
VLDL: 54.8 mg/dL — ABNORMAL HIGH (ref 0.0–40.0)

## 2022-05-24 LAB — CBC
HCT: 43.8 % (ref 39.0–52.0)
Hemoglobin: 15 g/dL (ref 13.0–17.0)
MCHC: 34.3 g/dL (ref 30.0–36.0)
MCV: 86 fl (ref 78.0–100.0)
Platelets: 243 10*3/uL (ref 150.0–400.0)
RBC: 5.09 Mil/uL (ref 4.22–5.81)
RDW: 12.6 % (ref 11.5–15.5)
WBC: 6.7 10*3/uL (ref 4.0–10.5)

## 2022-05-24 LAB — PSA: PSA: 1.74 ng/mL (ref 0.10–4.00)

## 2022-05-24 LAB — LDL CHOLESTEROL, DIRECT: Direct LDL: 75 mg/dL

## 2022-05-24 MED ORDER — TADALAFIL 20 MG PO TABS: 10.0000 mg | ORAL_TABLET | ORAL | 2 refills | Status: DC | PRN

## 2022-05-24 NOTE — Patient Instructions (Signed)
Give us 2-3 business days to get the results of your labs back.   Keep the diet clean and stay active.  I recommend getting the flu shot in mid October. This suggestion would change if the CDC comes out with a different recommendation.   Let us know if you need anything. 

## 2022-05-24 NOTE — Progress Notes (Signed)
Chief Complaint  Patient presents with   Annual Exam    Doing well , no complaints    Well Male Jordan Hendricks is here for a complete physical.   His last physical was >1 year ago.  Current diet: in general, diet could be better.  Current exercise: none Weight trend: stable Fatigue out of ordinary? No. Seat belt? Yes.   Advanced directive? Yes  Health maintenance Shingrix- Yes Colonoscopy- Yes Tetanus- Yes HIV- Yes Hep C- Yes   Past Medical History:  Diagnosis Date   Anginal pain (HCC)    Coronary artery disease    Depression    Essential hypertension 07/13/2016   Family history of adverse reaction to anesthesia    " My Dad had bad reactions ,He would wake up & have hallucinations"   GERD (gastroesophageal reflux disease)    History of chicken pox    History of colon polyps    Hyperlipidemia       Past Surgical History:  Procedure Laterality Date   CARDIAC CATHETERIZATION  10/07/2016   CARDIAC CATHETERIZATION N/A 10/07/2016   Procedure: Left Heart Cath and Coronary Angiography;  Surgeon: Yvonne Kendall, MD;  Location: [facility] INVASIVE CV LAB;  Service: Cardiovascular;  Laterality: N/A;   CARDIAC CATHETERIZATION N/A 10/07/2016   Procedure: Coronary Stent Intervention;  Surgeon: Yvonne Kendall, MD;  Location: [facility] INVASIVE CV LAB;  Service: Cardiovascular;  Laterality: N/A;  RCA   PERCUTANEOUS CORONARY STENT INTERVENTION (PCI-S)  10/07/2016   RCA   REFRACTIVE SURGERY Bilateral    ROTATOR CUFF REPAIR Right 2015   WISDOM TOOTH EXTRACTION      Medications  Current Outpatient Medications on File Prior to Visit  Medication Sig Dispense Refill   aspirin EC 81 MG tablet Take 1 tablet (81 mg total) by mouth daily. 30 tablet 11   busPIRone (BUSPAR) 30 MG tablet Take 30 mg by mouth 2 (two) times daily.     glucosamine-chondroitin 500-400 MG tablet Take 1 tablet by mouth 2 (two) times daily.     lisinopril (ZESTRIL) 20 MG tablet TAKE 1 TABLET BY MOUTH  DAILY 90 tablet 3    Melatonin 5 MG CHEW Chew 1 capsule by mouth at bedtime.     metoprolol succinate (TOPROL-XL) 50 MG 24 hr tablet Take 1 tablet (50 mg total) by mouth daily. Take with or immediately following a meal. 90 tablet 3   Multiple Vitamins-Minerals (MULTIVITAMIN ADULT PO) Take 1 tablet by mouth daily.     nitroGLYCERIN (NITROSTAT) 0.4 MG SL tablet Place 1 tablet (0.4 mg total) under the tongue every 5 (five) minutes as needed for chest pain. 25 tablet prn   nortriptyline (PAMELOR) 25 MG capsule Take 1 capsule by mouth at bedtime.     nortriptyline (PAMELOR) 50 MG capsule Take 2 capsules by mouth at bedtime.     pantoprazole (PROTONIX) 40 MG tablet TAKE 1 TABLET BY MOUTH DAILY 90 tablet 3   rosuvastatin (CRESTOR) 40 MG tablet TAKE 1 TABLET BY MOUTH  DAILY 90 tablet 3   traZODone (DESYREL) 100 MG tablet Take 100 mg by mouth at bedtime.     VITAMIN D, CHOLECALCIFEROL, PO Take by mouth.     No current facility-administered medications on file prior to visit.     Allergies Allergies  Allergen Reactions   Lactose Intolerance (Gi)    Tape Rash    Plastic tape    Family History Family History  Problem Relation Age of Onset   Heart disease Father  Prostate cancer Father    Alzheimer's disease Father    Diabetes Father    Hypertension Father    Diabetes Maternal Grandmother    Alzheimer's disease Paternal Grandmother    Breast cancer Neg Hx     Review of Systems: Constitutional:  no fevers Eye:  no recent significant change in vision Ear/Nose/Mouth/Throat:  Ears:  no hearing loss Nose/Mouth/Throat:  no complaints of nasal congestion, no sore throat Cardiovascular:  no chest pain Respiratory:  no shortness of breath Gastrointestinal:  no change in bowel habits GU:  Male: negative for dysuria, frequency Musculoskeletal/Extremities:  no joint pain Integumentary (Skin/Breast):  no abnormal skin lesions reported Neurologic:  no headaches Endocrine: No unexpected weight  changes Hematologic/Lymphatic:  no abnormal bleeding  Exam BP 132/80   Pulse 93   Resp 18   Ht 5\' 7"  (1.702 m)   Wt 195 lb 12.8 oz (88.8 kg)   SpO2 94%   BMI 30.67 kg/m  General:  well developed, well nourished, in no apparent distress Skin: scaling noted over posterior portion of scalp; otherwise no significant moles, warts, or growths Head:  no masses, lesions, or tenderness Eyes:  pupils equal and round, sclera anicteric without injection Ears:  canals without lesions, TMs shiny without retraction, no obvious effusion, no erythema Nose:  nares patent, septum midline, mucosa normal Throat/Pharynx:  lips and gingiva without lesion; tongue and uvula midline; non-inflamed pharynx; no exudates or postnasal drainage Neck: neck supple without adenopathy, thyromegaly, or masses Cardiac: RRR, no bruits, no LE edema Lungs:  clear to auscultation, breath sounds equal bilaterally, no respiratory distress Abdomen: BS+, soft, non-tender, non-distended, no masses or organomegaly noted Rectal: Deferred Musculoskeletal:  symmetrical muscle groups noted without atrophy or deformity Neuro:  gait normal; deep tendon reflexes normal and symmetric Psych: well oriented with normal range of affect and appropriate judgment/insight  Assessment and Plan  Well adult exam - Plan: CBC, Comprehensive metabolic panel, Lipid panel  Screening for prostate cancer - Plan: PSA   Well 61 y.o. male. Counseled on diet and exercise. Counseled on risks and benefits of prostate cancer screening with PSA. The patient agrees to undergo testing. Seb derm noted on scalp.  He has concerns about his resting HR. I thin improving diet/exercise will also improve this. Will ck labs. If still having issues, he will reach out to cards.  Immunizations, labs, and further orders as above. Follow up in 6 mo or prn. The patient voiced understanding and agreement to the plan.  [address] [place], DO 05/24/22 9:53 AM

## 2022-05-25 ENCOUNTER — Other Ambulatory Visit: Payer: Self-pay | Admitting: Family Medicine

## 2022-05-25 ENCOUNTER — Other Ambulatory Visit (INDEPENDENT_AMBULATORY_CARE_PROVIDER_SITE_OTHER): Payer: 59

## 2022-05-25 DIAGNOSIS — R739 Hyperglycemia, unspecified: Secondary | ICD-10-CM

## 2022-05-25 LAB — HEMOGLOBIN A1C: Hgb A1c MFr Bld: 7.4 % — ABNORMAL HIGH (ref 4.6–6.5)

## 2022-06-01 ENCOUNTER — Encounter: Payer: Self-pay | Admitting: Family Medicine

## 2022-06-01 ENCOUNTER — Ambulatory Visit: Payer: 59 | Admitting: Family Medicine

## 2022-06-01 VITALS — BP 112/64 | HR 99 | Temp 98.3°F | Ht 67.0 in | Wt 191.4 lb

## 2022-06-01 DIAGNOSIS — E1165 Type 2 diabetes mellitus with hyperglycemia: Secondary | ICD-10-CM

## 2022-06-01 DIAGNOSIS — Z23 Encounter for immunization: Secondary | ICD-10-CM

## 2022-06-01 MED ORDER — NITROGLYCERIN 0.4 MG SL SUBL: 0.4000 mg | SUBLINGUAL_TABLET | SUBLINGUAL | 99 refills | Status: DC | PRN

## 2022-06-01 MED ORDER — TADALAFIL 20 MG PO TABS: 10.0000 mg | ORAL_TABLET | ORAL | 2 refills | Status: DC | PRN

## 2022-06-01 NOTE — Progress Notes (Signed)
Chief Complaint  Patient presents with   Results    Subjective: Patient is a 61 y.o. male here for f/u labs. Here w wife.   Pt's a1c found to be 7.4. diet had been struggling, sig improved since receiving news. Going to join a gym. No increased thirst or unexplained wt loss. He is on a statin. Has an eye doc but has not seen them in >1 yr.   Past Medical History:  Diagnosis Date   Anginal pain (HCC)    Coronary artery disease    Depression    Essential hypertension 07/13/2016   Family history of adverse reaction to anesthesia    " My Dad had bad reactions ,He would wake up & have hallucinations"   GERD (gastroesophageal reflux disease)    History of chicken pox    History of colon polyps    Hyperlipidemia     Objective: BP 112/64   Pulse 99   Temp 98.3 F (36.8 C) (Oral)   Ht 5\' 7"  (1.702 m)   Wt 191 lb 6 oz (86.8 kg)   SpO2 97%   BMI 29.97 kg/m  General: Awake, appears stated age Skin: No ext lesions Neuro: Sensation intact to pinprick over b/l feet Lungs: No accessory muscle use Psych: Age appropriate judgment and insight, normal affect and mood  Assessment and Plan: Type 2 diabetes mellitus with hyperglycemia, without long-term current use of insulin (HCC) - Plan: Microalbumin / creatinine urine ratio  Chronic, currently unstable. Cont rosuvastatin 40 mg/d. Offered medication, he would like to try focusing on diet/exercise. No need to monitor sugars at this time. Wife is also diabetic, could help with questions also. Counseled on diet/exercise. Foot exam nml. Needs to sched eye exam. PCV20 today. F/u as originally scheduled in 6 mo.  The patient voiced understanding and agreement to the plan.  [place], DO 06/01/22  1:56 PM

## 2022-06-01 NOTE — Patient Instructions (Addendum)
Give [place] 2-3 business days to get the results of your labs back.   Keep the diet clean and stay active.  Schedule an eye exam at your convenience.   Let us know if you need anything.

## 2022-06-01 NOTE — Addendum Note (Signed)
Addended by: Scharlene Gloss B on: 06/01/2022 01:59 PM   Modules accepted: Orders

## 2022-06-29 ENCOUNTER — Encounter: Payer: Self-pay | Admitting: Family Medicine

## 2022-07-08 ENCOUNTER — Encounter: Payer: Self-pay | Admitting: Family Medicine

## 2022-07-08 ENCOUNTER — Ambulatory Visit (INDEPENDENT_AMBULATORY_CARE_PROVIDER_SITE_OTHER): Payer: 59

## 2022-07-08 ENCOUNTER — Other Ambulatory Visit (INDEPENDENT_AMBULATORY_CARE_PROVIDER_SITE_OTHER): Payer: 59

## 2022-07-08 DIAGNOSIS — R739 Hyperglycemia, unspecified: Secondary | ICD-10-CM

## 2022-07-08 DIAGNOSIS — Z23 Encounter for immunization: Secondary | ICD-10-CM | POA: Diagnosis not present

## 2022-07-08 LAB — LIPID PANEL
Cholesterol: 100 mg/dL (ref 0–200)
HDL: 37.1 mg/dL — ABNORMAL LOW (ref >39.00)
LDL Cholesterol: 32 mg/dL (ref 0–99)
NonHDL: 62.52
Total CHOL/HDL Ratio: 3
Triglycerides: 151 mg/dL — ABNORMAL HIGH (ref 0.0–149.0)
VLDL: 30.2 mg/dL (ref 0.0–40.0)

## 2022-07-08 LAB — MICROALBUMIN / CREATININE URINE RATIO
Creatinine,U: 143.6 mg/dL
Microalb Creat Ratio: 0.5 mg/g (ref 0.0–30.0)
Microalb, Ur: <0.7 mg/dL (ref 0.0–1.9)

## 2022-07-08 NOTE — Progress Notes (Signed)
Pt came in for Lab Draw and asked if he can have the flu vaccine.

## 2022-07-08 NOTE — Addendum Note (Signed)
Addended by: Manuela Schwartz on: 07/08/2022 08:17 AM   Modules accepted: Orders

## 2022-07-11 ENCOUNTER — Encounter: Payer: Self-pay | Admitting: Family Medicine

## 2022-07-25 ENCOUNTER — Other Ambulatory Visit: Payer: Self-pay | Admitting: Family Medicine

## 2022-08-08 ENCOUNTER — Telehealth: Payer: Self-pay | Admitting: Cardiology

## 2022-08-08 NOTE — Telephone Encounter (Signed)
STAT if patient feels like he/she is going to faint   Are you dizzy now?   Do you feel faint or have you passed out?    Do you have any other symptoms?    Have you checked your HR and BP (record if available)?  Patient's wife states BP is fine, HR is normally 80-90 but was a little low for the patient at 15   Patient's wife states on Saturday around 2:00 AM the patient felt faint and called her name. She states he realized he was calling her name from the floor. Patient's wife was unsure whether or not he passed out, but he was cold, clammy, and "white as a sheet" in complexion. She also mentions his temperature was 96.7 and he refused to go to the ED.

## 2022-08-08 NOTE — Telephone Encounter (Signed)
Spoke with wife, Britta Mccreedy who reports pt had gotten up to go to the bathroom appr 2 am Saturday morning, she heard him calling her name and found him in the floor -sweaty and pale.  HR was around 67 bpm which is low for him, his BP was "normal"   They did not check his blood sugar.  Saturday, while pt was up on a ladder hanging Christmas lights, he became dizzy and fell off (without injury).  Pt has not had any chest pain but symptoms are much like before when pt had to stent placement. Discussed s/s with Eligha Bridegroom, NP - appt scheduled for further eval on 08/10/22.  Pt/wife will call back prior to then if any changes, questions or concerns.

## 2022-08-09 ENCOUNTER — Telehealth: Payer: Self-pay | Admitting: *Deleted

## 2022-08-09 NOTE — Telephone Encounter (Signed)
S/w pt to move up appointment.  Appt moved to 9:40 am.

## 2022-08-09 NOTE — Progress Notes (Unsigned)
Cardiology Office Note:    Date:  08/10/2022   ID:  Jordan Hendricks, DOB 1960/11/04, MRN [id number]  PCP:  Sharlene Dory, DO   [facility] Providers Cardiologist:  Donato Schultz, MD  Referring MD: Sharlene Dory*    Chief complaint: presyncope  History of Present Illness:    Jordan Hendricks is a 61 y.o. male with a hx of HTN, CAD with angina, family history of early CAD, hyperlipidemia, nephrolithiasis,depression, and gynecomastia.   His cardiac history is significant for referral to our office in January 2018 for escalating angina. He was found to have T-wave inversions on EKG in the inferior lateral leads, dyspnea on exertion and teeth/jaw pain with exertion and underwent LHC on 10/07/16. He was found to have 95% proximal RCA stenosis with competitive filling of the rPL branches via the LCx, successful PCi to pRCA with placement of DES.    He was last seen in our office on 09/17/20 by Dr. Anne Fu. Metoprolol was d/ced due to mild orthostatic symptoms. One year follow-up was recommended.   Last cardiology clinic visit was with me on 10/05/2021.  He was seen with his wife and had no specific concerns. Heart rate has increased over the last year since stopping metoprolol per chart review. He has not been monitoring HR or BP at home. Is not exercising regularly and admits to drinking multiple caffeinated beverages daily including soda and coffee.  Was advised to monitor HR and BP and let us know if either was elevated.  He called back 12/01/21 to report HR consistently > 90. Was advised to restart Toprol XL 25 mg daily.   His wife called 08/08/2022 to report that on 11/18 at about 2 AM he felt faint and called her name.  She realized he was on the floor.  Was unsure whether or not he passed out but he was cold clammy and white in complexion. He refused to go to the ED. HR was 67 bpm, BP was "normal." Blood sugar was not checked.  Later in the day he was hanging Christmas  lights and became dizzy and fell off.  The symptoms are similar to what he felt prior to stent placement in 2018. He was added to my schedule.  Today, he is here for evaluation with his wife. Reports he has been feeling well with occasional lightheadedness. Diagnosed with diabetes a few months ago and since that time has been working on better diet and has lost about 13 lbs. Reports that on 11/18, he woke up from sleep, feeling like he needed to have a bowel movement. Strained some, did not have BM, got up to go back to bed, there were no lights on and he fell, hit the wall and hit the floor. This woke his wife and when she got to him, his pulse was 67 bpm, he was cold and clammy, and BP 120s. Does not think he lost consciousness. Looked "white as a sheet" per his wife. The next day he was up on a ladder hanging Christmas lights, got a little dizzy, jumped off and landed on his feet. Was looking up with arms in the air while on the ladder.  Staying well-hydrated. HR has been staying in the 90s, does not monitor BP consistently.  Has been going to the gym, less consistently over past 2 weeks due to work, but no symptoms of chest pain, SOB, palpitations, presyncope or syncope with exercise. No further presyncope or syncope since 08/07/22.   Past Medical History:  Diagnosis Date   Anginal pain (HCC)    Coronary artery disease    Depression    Essential hypertension 07/13/2016   Family history of adverse reaction to anesthesia    " My Dad had bad reactions ,He would wake up & have hallucinations"   GERD (gastroesophageal reflux disease)    History of chicken pox    History of colon polyps    Hyperlipidemia     Past Surgical History:  Procedure Laterality Date   CARDIAC CATHETERIZATION  10/07/2016   CARDIAC CATHETERIZATION N/A 10/07/2016   Procedure: Left Heart Cath and Coronary Angiography;  Surgeon: Yvonne Kendallhristopher End, MD;  [facility];  Service: Cardiovascular;  Laterality: N/A;    CARDIAC CATHETERIZATION N/A 10/07/2016   Procedure: Coronary Stent Intervention;  Surgeon: Yvonne Kendallhristopher End, MD;  Location: [facility] INVASIVE CV LAB;  Service: Cardiovascular;  Laterality: N/A;  RCA   PERCUTANEOUS CORONARY STENT INTERVENTION (PCI-S)  10/07/2016   RCA   REFRACTIVE SURGERY Bilateral    ROTATOR CUFF REPAIR Right 2015   WISDOM TOOTH EXTRACTION      Current Medications: Current Meds  Medication Sig   aspirin EC 81 MG tablet Take 1 tablet (81 mg total) by mouth daily.   busPIRone (BUSPAR) 30 MG tablet Take 30 mg by mouth 2 (two) times daily.   glucosamine-chondroitin 500-400 MG tablet Take 1 tablet by mouth 2 (two) times daily.   Melatonin 5 MG CHEW Chew 1 capsule by mouth at bedtime.   metoprolol succinate (TOPROL-XL) 50 MG 24 hr tablet Take 1 tablet (50 mg total) by mouth daily. Take with or immediately following a meal.   Multiple Vitamins-Minerals (MULTIVITAMIN ADULT PO) Take 1 tablet by mouth daily.   nitroGLYCERIN (NITROSTAT) 0.4 MG SL tablet Place 1 tablet (0.4 mg total) under the tongue every 5 (five) minutes as needed for chest pain.   nortriptyline (PAMELOR) 25 MG capsule Take 1 capsule by mouth at bedtime.   nortriptyline (PAMELOR) 50 MG capsule Take 2 capsules by mouth at bedtime.   pantoprazole (PROTONIX) 40 MG tablet TAKE 1 TABLET BY MOUTH DAILY   rosuvastatin (CRESTOR) 40 MG tablet TAKE 1 TABLET BY MOUTH DAILY   tadalafil (CIALIS) 20 MG tablet Take 0.5-1 tablets (10-20 mg total) by mouth every other day as needed for erectile dysfunction.   traZODone (DESYREL) 100 MG tablet Take 100 mg by mouth at bedtime.   VITAMIN D, CHOLECALCIFEROL, PO Take by mouth.   [DISCONTINUED] lisinopril (ZESTRIL) 20 MG tablet TAKE 1 TABLET BY MOUTH  DAILY     Allergies:   Lactose intolerance (gi) and Tape   Social History   Socioeconomic History   Marital status: Married    Spouse name: Not on file   Number of children: Not on file   Years of education: Not on file   Highest  education level: Not on file  Occupational History   Not on file  Tobacco Use   Smoking status: Never   Smokeless tobacco: Never  Vaping Use   Vaping Use: Never used  Substance and Sexual Activity   Alcohol use: Yes    Comment: social   Drug use: No   Sexual activity: Not on file  Other Topics Concern   Not on file  Social History Narrative   Not on file   Social Determinants of Health   Financial Resource Strain: Not on file  Food Insecurity: Not on file  Transportation Needs: Not on file  Physical Activity: Not on file  Stress: Not on file  Social Connections: Not on file     Family History: The patient's family history includes Alzheimer's disease in his father and paternal grandmother; Diabetes in his father and maternal grandmother; Heart disease in his father; Hypertension in his father; Prostate cancer in his father. There is no history of Breast cancer.  ROS:   Please see the history of present illness.  All other systems reviewed and are negative.  Labs/Other Studies Reviewed:    The following studies were reviewed today:  LHC 1/18  FINDINGS: 95% proximal RCA stenosis with competitive filling of the rPL branches via the LCx. Mild to moderate, non-obstructive CAD involving the left coronary artery. Pressure dampening in the LMCA and ostial RCA with catheter engagement that improved after intracoronary nitroglycerin consistent with vasospasm. Normal left ventricular filling pressure. Normal left ventricular contraction. Successful PCI to proximal RCA with placement of Promus Premier 3.0 x 16 mm drug-eluting stent post-dilated with 3.5 mm Nimrod balloon.   RECOMMENDATIONS: Admit for overnight observation following PCI given significant vasospasm. Patient loaded with prasugrel in the cath lab and will continue clopidogrel 75 mg daily for at least 6 months, ideally longer. Aggressive secondary prevention and medical therapy for component of vasospasm.    Recent  Labs: 05/24/2022: ALT 47; BUN 14; Creatinine, Ser 1.10; Hemoglobin 15.0; Platelets 243.0; Potassium 4.8; Sodium 140  Recent Lipid Panel    Component Value Date/Time   CHOL 100 07/08/2022 0754   CHOL 158 08/31/2018 0836   TRIG 151.0 (H) 07/08/2022 0754   HDL 37.10 (L) 07/08/2022 0754   HDL 50 08/31/2018 0836   CHOLHDL 3 07/08/2022 0754   VLDL 30.2 07/08/2022 0754   LDLCALC 32 07/08/2022 0754   LDLCALC 97 11/08/2019 1457   LDLDIRECT 75.0 05/24/2022 0958     Physical Exam:    VS:  BP 110/76   Pulse 92   Ht  (1.702 m)   Wt 180 lb 12.8 oz (82 kg)   SpO2 98%   BMI 28.32 kg/m     Wt Readings from Last 3 Encounters:  08/10/22 180 lb 12.8 oz (82 kg)  06/01/22 191 lb 6 oz (86.8 kg)  05/24/22 195 lb 12.8 oz (88.8 kg)     GEN: Well nourished, well developed in no acute distress HEENT: Normal NECK: No JVD; No carotid bruits LYMPHATICS: No lymphadenopathy CARDIAC: RRR, no murmurs, rubs, gallops RESPIRATORY:  Clear to auscultation without rales, wheezing or rhonchi  ABDOMEN: Soft, non-tender, non-distended MUSCULOSKELETAL:  No edema; No deformity. 2+ pedal pulses equal bilaterally SKIN: Warm and dry NEUROLOGIC:  Alert and oriented x 3 PSYCHIATRIC:  Normal affect   EKG:  EKG is ordered today.  EKG reveals normal sinus rhythm at 92 bpm, no ST abnormality.  Diagnoses:    1. Coronary artery disease involving native coronary artery of native heart without angina pectoris   2. Hyperlipidemia LDL goal <70   3. Essential hypertension     Assessment and Plan:     Near syncope: Episode of lightheadedness, nearly passing out following straining to have a bowel movement. The following day, got dizzy while on a ladder hanging Christmas lights. No loss of consciousness. No palpitations, chest pain, or dyspnea associated with presyncope. Has lost 13 lbs in the past few months, is exercising more and better diet. I suspect his BP has improved and he may be over-medicated.  We will  reduce lisinopril to 10 mg daily.  I have advised him to monitor BP as  well as symptoms and report any concerns prior to next office visit.  Will continue metoprolol at 50 mg due to high resting heart rate. Advised him to notify us if there is recurrence of symptoms at which time, would favor event monitor.   Essential hypertension: As noted above, I suspect BP has improved with weight loss and exercise. Will reduce lisinopril to 10 mg daily and continue metoprolol 50 mg daily.  I have asked him to monitor and report any concerns.  CAD without angina: He denies chest pain, dyspnea, or other symptoms concerning for angina. No symptoms with regular exercise.  Continue heart healthy diet and regular exercise.  Continue aspirin, rosuvastatin, metoprolol.  Hyperlipidemia LDL goal < 70: LDL 32 on 06/20/22. Continue rosuvastatin.  Disposition: Keep your February appointment with Dr. Anne Fu    Medication Adjustments/Labs and Tests Ordered: Current medicines are reviewed at length with the patient today.  Concerns regarding medicines are outlined above.  Orders Placed This Encounter  Procedures   EKG 12-Lead   Meds ordered this encounter  Medications   lisinopril (ZESTRIL) 10 MG tablet    Sig: Take 1 tablet (10 mg total) by mouth daily.    Dispense:  90 tablet    Refill:  3    Requesting 1 year supply    Patient Instructions  Medication Instructions:   DECREASE Lisinopril one (1) tablet by mouth ( 10 mg) daily.  *If you need a refill on your cardiac medications before your next appointment, please call your pharmacy*   Lab Work:  None ordered.  If you have labs (blood work) drawn today and your tests are completely normal, you will receive your results only by: MyChart Message (if you have MyChart) OR A paper copy in the mail If you have any lab test that is abnormal or we need to change your treatment, we will call you to review the results.   Testing/Procedures:  None  ordered.   Follow-Up: At [facility], you and your health needs are our priority.  As part of our continuing mission to provide you with exceptional heart care, we have created designated Provider Care Teams.  These Care Teams include your primary Cardiologist (physician) and Advanced Practice Providers (APPs -  Physician Assistants and Nurse Practitioners) who all work together to provide you with the care you need, when you need it.  We recommend signing up for the patient portal called "MyChart".  Sign up information is provided on this After Visit Summary.  MyChart is used to connect with patients for Virtual Visits (Telemedicine).  Patients are able to view lab/test results, encounter notes, upcoming appointments, etc.  Non-urgent messages can be sent to your provider as well.   To learn more about what you can do with MyChart, go to [email or web address].    Your next appointment:   2 month(s)  The format for your next appointment:   In Person  Provider:   Donato Schultz, MD     Other Instructions  HOW TO TAKE YOUR BLOOD PRESSURE  Rest 5 minutes before taking your blood pressure. Don't  smoke or drink caffeinated beverages for at least 30 minutes before. Take your blood pressure before (not after) you eat. Sit comfortably with your back supported and both feet on the floor ( don't cross your legs). Elevate your arm to heart level on a table or a desk. Use the proper sized cuff.  It should fit smoothly and snugly around your bare upper arm.  There  should be  Enough room to slip a fingertip under the cuff.  The bottom edge of the cuff should be 1 inch above the crease Of the elbow. Please monitor your blood pressure once daily 2 hours after your am medication. If you blood pressure Consistently remains above 130 (systolic) top number or over 80 ( diastolic) bottom number X 3 days  Consecutively.  Please call our office at [phone] or send Mychart message.      Important Information About Sugar         Signed, Levi Aland, NP  08/10/2022 1:03 PM    [facility]

## 2022-08-10 ENCOUNTER — Encounter: Payer: Self-pay | Admitting: Nurse Practitioner

## 2022-08-10 ENCOUNTER — Ambulatory Visit: Payer: 59 | Attending: Nurse Practitioner | Admitting: Nurse Practitioner

## 2022-08-10 VITALS — BP 110/76 | HR 92 | Ht 67.0 in | Wt 180.8 lb

## 2022-08-10 DIAGNOSIS — E785 Hyperlipidemia, unspecified: Secondary | ICD-10-CM

## 2022-08-10 DIAGNOSIS — R55 Syncope and collapse: Secondary | ICD-10-CM | POA: Diagnosis not present

## 2022-08-10 DIAGNOSIS — I10 Essential (primary) hypertension: Secondary | ICD-10-CM | POA: Diagnosis not present

## 2022-08-10 DIAGNOSIS — I2510 Atherosclerotic heart disease of native coronary artery without angina pectoris: Secondary | ICD-10-CM | POA: Diagnosis not present

## 2022-08-10 MED ORDER — LISINOPRIL 10 MG PO TABS: 10.0000 mg | ORAL_TABLET | Freq: Every day | ORAL | 3 refills | Status: DC

## 2022-08-10 NOTE — Patient Instructions (Signed)
Medication Instructions:   DECREASE Lisinopril one (1) tablet by mouth ( 10 mg) daily.  *If you need a refill on your cardiac medications before your next appointment, please call your pharmacy*   Lab Work:  None ordered.  If you have labs (blood work) drawn today and your tests are completely normal, you will receive your results only by: MyChart Message (if you have MyChart) OR A paper copy in the mail If you have any lab test that is abnormal or we need to change your treatment, we will call you to review the results.   Testing/Procedures:  None ordered.   Follow-Up: At [facility], you and your health needs are our priority.  As part of our continuing mission to provide you with exceptional heart care, we have created designated Provider Care Teams.  These Care Teams include your primary Cardiologist (physician) and Advanced Practice Providers (APPs -  Physician Assistants and Nurse Practitioners) who all work together to provide you with the care you need, when you need it.  We recommend signing up for the patient portal called "MyChart".  Sign up information is provided on this After Visit Summary.  MyChart is used to connect with patients for Virtual Visits (Telemedicine).  Patients are able to view lab/test results, encounter notes, upcoming appointments, etc.  Non-urgent messages can be sent to your provider as well.   To learn more about what you can do with MyChart, go to [email or web address].    Your next appointment:   2 month(s)  The format for your next appointment:   In Person  Provider:   Donato Schultz, MD     Other Instructions  HOW TO TAKE YOUR BLOOD PRESSURE  Rest 5 minutes before taking your blood pressure. Don't  smoke or drink caffeinated beverages for at least 30 minutes before. Take your blood pressure before (not after) you eat. Sit comfortably with your back supported and both feet on the floor ( don't cross your legs). Elevate your  arm to heart level on a table or a desk. Use the proper sized cuff.  It should fit smoothly and snugly around your bare upper arm.  There should be  Enough room to slip a fingertip under the cuff.  The bottom edge of the cuff should be 1 inch above the crease Of the elbow. Please monitor your blood pressure once daily 2 hours after your am medication. If you blood pressure Consistently remains above 130 (systolic) top number or over 80 ( diastolic) bottom number X 3 days  Consecutively.  Please call our office at [phone] or send Mychart message.     Important Information About Sugar

## 2022-08-23 ENCOUNTER — Encounter: Payer: Self-pay | Admitting: Family Medicine

## 2022-08-23 ENCOUNTER — Other Ambulatory Visit: Payer: Self-pay | Admitting: Family Medicine

## 2022-08-23 MED ORDER — SILDENAFIL CITRATE 100 MG PO TABS: 50.0000 mg | ORAL_TABLET | Freq: Every day | ORAL | 2 refills | Status: DC | PRN

## 2022-09-19 HISTORY — PX: ROTATOR CUFF REPAIR: SHX139

## 2022-10-14 ENCOUNTER — Encounter: Payer: Self-pay | Admitting: Cardiology

## 2022-10-14 ENCOUNTER — Ambulatory Visit: Payer: 59 | Attending: Cardiology | Admitting: Cardiology

## 2022-10-14 VITALS — BP 110/68 | HR 80 | Ht 67.0 in | Wt 185.0 lb

## 2022-10-14 DIAGNOSIS — I2510 Atherosclerotic heart disease of native coronary artery without angina pectoris: Secondary | ICD-10-CM | POA: Diagnosis not present

## 2022-10-14 DIAGNOSIS — R55 Syncope and collapse: Secondary | ICD-10-CM | POA: Diagnosis not present

## 2022-10-14 DIAGNOSIS — I10 Essential (primary) hypertension: Secondary | ICD-10-CM

## 2022-10-14 MED ORDER — NITROGLYCERIN 0.4 MG SL SUBL: 0.4000 mg | SUBLINGUAL_TABLET | SUBLINGUAL | 3 refills | Status: AC | PRN

## 2022-10-14 NOTE — Patient Instructions (Signed)
Medication Instructions:  The current medical regimen is effective;  continue present plan and medications.  *If you need a refill on your cardiac medications before your next appointment, please call your pharmacy*  Testing/Procedures: Your physician has requested that you have an echocardiogram. Echocardiography is a painless test that uses sound waves to create images of your heart. It provides your doctor with information about the size and shape of your heart and how well your heart's chambers and valves are working. This procedure takes approximately one hour. There are no restrictions for this procedure. Please do NOT wear cologne, perfume, aftershave, or lotions (deodorant is allowed). Please arrive 15 minutes prior to your appointment time.  Follow-Up: At [facility], you and your health needs are our priority.  As part of our continuing mission to provide you with exceptional heart care, we have created designated Provider Care Teams.  These Care Teams include your primary Cardiologist (physician) and Advanced Practice Providers (APPs -  Physician Assistants and Nurse Practitioners) who all work together to provide you with the care you need, when you need it.  We recommend signing up for the patient portal called "MyChart".  Sign up information is provided on this After Visit Summary.  MyChart is used to connect with patients for Virtual Visits (Telemedicine).  Patients are able to view lab/test results, encounter notes, upcoming appointments, etc.  Non-urgent messages can be sent to your provider as well.   To learn more about what you can do with MyChart, go to [email or web address].    Your next appointment:   1 year(s)  Provider:   Mark Skains, MD      

## 2022-10-14 NOTE — Progress Notes (Signed)
Cardiology Office Note:    Date:  10/14/2022   ID:  Jordan Hendricks, DOB 09-08-1961, MRN [id number]  PCP:  Shelda Pal, DO  [facility] Cardiologist:  Candee Furbish, MD  [facility] Electrophysiologist:  None   Referring MD: Shelda Pal*     History of Present Illness:    Jordan Hendricks is a 62 y.o. male here for the follow-up of coronary artery disease  prior RCA stent.  RCA stent placed 10/07/2016  Previously had a mild scar/not over arteriotomy radial site but this has improved.  Has a strong family history with father having CABG at age 57.  Practicing attorney, busy.   Has been battling with severe depression.  Underwent transcranial magnetic therapy currently.  Previously on FMLA.  Prior shingles in 2018.  At last office visit he did state that he had some dizziness when standing up and then when he saw Sharyn Lull, he did state that he had a near syncopal episode as described below.  We decreased his lisinopril to 10 mg.   Past Medical History:  Diagnosis Date   Anginal pain ([place])    Coronary artery disease    Depression    Essential hypertension 07/13/2016   Family history of adverse reaction to anesthesia    " My Dad had bad reactions ,He would wake up & have hallucinations"   GERD (gastroesophageal reflux disease)    History of chicken pox    History of colon polyps    Hyperlipidemia     Past Surgical History:  Procedure Laterality Date   CARDIAC CATHETERIZATION  10/07/2016   CARDIAC CATHETERIZATION N/A 10/07/2016   Procedure: Left Heart Cath and Coronary Angiography;  Surgeon: Nelva Bush, MD;  Location: [place] [facility];  Service: Cardiovascular;  Laterality: N/A;   CARDIAC CATHETERIZATION N/A 10/07/2016   Procedure: Coronary Stent Intervention;  Surgeon: Nelva Bush, MD;  Location: [place] [facility];  Service: Cardiovascular;  Laterality: N/A;  RCA   PERCUTANEOUS CORONARY STENT INTERVENTION (PCI-S)  10/07/2016    RCA   REFRACTIVE SURGERY Bilateral    ROTATOR CUFF REPAIR Right 2015   WISDOM TOOTH EXTRACTION      Current Medications: Current Meds  Medication Sig   aspirin EC 81 MG tablet Take 1 tablet (81 mg total) by mouth daily.   busPIRone (BUSPAR) 30 MG tablet Take 30 mg by mouth 2 (two) times daily.   glucosamine-chondroitin 500-400 MG tablet Take 1 tablet by mouth 2 (two) times daily.   lisinopril (ZESTRIL) 10 MG tablet Take 10 mg by mouth daily.   Melatonin 5 MG CHEW Chew 1 capsule by mouth at bedtime.   metoprolol succinate (TOPROL-XL) 50 MG 24 hr tablet Take 1 tablet (50 mg total) by mouth daily. Take with or immediately following a meal.   Multiple Vitamins-Minerals (MULTIVITAMIN ADULT PO) Take 1 tablet by mouth daily.   nortriptyline (PAMELOR) 25 MG capsule Take 1 capsule by mouth at bedtime.   nortriptyline (PAMELOR) 50 MG capsule Take 2 capsules by mouth at bedtime.   pantoprazole (PROTONIX) 40 MG tablet TAKE 1 TABLET BY MOUTH DAILY   rosuvastatin (CRESTOR) 40 MG tablet TAKE 1 TABLET BY MOUTH DAILY   sildenafil (VIAGRA) 100 MG tablet Take 0.5-1 tablets (50-100 mg total) by mouth daily as needed for erectile dysfunction.   traZODone (DESYREL) 100 MG tablet Take 100 mg by mouth at bedtime.   VITAMIN D, CHOLECALCIFEROL, PO Take by mouth daily at 6 (six) AM.   [DISCONTINUED] nitroGLYCERIN (NITROSTAT)  0.4 MG SL tablet Place 1 tablet (0.4 mg total) under the tongue every 5 (five) minutes as needed for chest pain.     Allergies:   Lactose intolerance (gi) and Tape   Social History   Socioeconomic History   Marital status: Married    Spouse name: Not on file   Number of children: Not on file   Years of education: Not on file   Highest education level: Not on file  Occupational History   Not on file  Tobacco Use   Smoking status: Never   Smokeless tobacco: Never  Vaping Use   Vaping Use: Never used  Substance and Sexual Activity   Alcohol use: Yes    Comment: social   Drug use:  No   Sexual activity: Not on file  Other Topics Concern   Not on file  Social History Narrative   Not on file   Social Determinants of Health   Financial Resource Strain: Not on file  Food Insecurity: Not on file  Transportation Needs: Not on file  Physical Activity: Not on file  Stress: Not on file  Social Connections: Not on file     Family History: The patient's family history includes Alzheimer's disease in his father and paternal grandmother; Diabetes in his father and maternal grandmother; Heart disease in his father; Hypertension in his father; Prostate cancer in his father. There is no history of Breast cancer.  ROS:   Please see the history of present illness.     All other systems reviewed and are negative.  EKGs/Labs/Other Studies Reviewed:    The following studies were reviewed today:  Cardiac catheterization 10/07/2016: 10/07/16: Cath Conclusion    FINDINGS: 95% proximal RCA stenosis with competitive filling of the rPL branches via the LCx. Mild to moderate, non-obstructive CAD involving the left coronary artery. Pressure dampening in the LMCA and ostial RCA with catheter engagement that improved after intracoronary nitroglycerin consistent with vasospasm. Normal left ventricular filling pressure. Normal left ventricular contraction. Successful PCI to proximal RCA with placement of Promus Premier 3.0 x 16 mm drug-eluting stent post-dilated with 3.5 mm [place] balloon.   RECOMMENDATIONS: Admit for overnight observation following PCI given significant vasospasm. Patient loaded with prasugrel in the cath lab and will continue clopidogrel 75 mg daily for at least 6 months, ideally longer. Aggressive secondary prevention and medical therapy for component of vasospasm.   Yvonne Kendall, MD     EKG:  EKG is  ordered today.  The ekg ordered today demonstrates sinus rhythm 65 no other abnormalities.  Recent Labs: 05/24/2022: ALT 47; BUN 14; Creatinine, Ser 1.10;  Hemoglobin 15.0; Platelets 243.0; Potassium 4.8; Sodium 140  Recent Lipid Panel    Component Value Date/Time   CHOL 100 07/08/2022 0754   CHOL 158 08/31/2018 0836   TRIG 151.0 (H) 07/08/2022 0754   HDL 37.10 (L) 07/08/2022 0754   HDL 50 08/31/2018 0836   CHOLHDL 3 07/08/2022 0754   VLDL 30.2 07/08/2022 0754   LDLCALC 32 07/08/2022 0754   LDLCALC 97 11/08/2019 1457   LDLDIRECT 75.0 05/24/2022 0958     Risk Assessment/Calculations:       Physical Exam:    VS:  BP 110/68   Pulse 80   Ht 5\' 7"  (1.702 m)   Wt 185 lb (83.9 kg)   SpO2 96%   BMI 28.98 kg/m     Wt Readings from Last 3 Encounters:  10/14/22 185 lb (83.9 kg)  08/10/22 180 lb 12.8 oz (82  kg)  06/01/22 191 lb 6 oz (86.8 kg)     GEN:  Well nourished, well developed in no acute distress HEENT: Normal NECK: No JVD; No carotid bruits LYMPHATICS: No lymphadenopathy CARDIAC: RRR, no murmurs, rubs, gallops RESPIRATORY:  Clear to auscultation without rales, wheezing or rhonchi  ABDOMEN: Soft, non-tender, non-distended MUSCULOSKELETAL:  No edema; No deformity  SKIN: Warm and dry NEUROLOGIC:  Alert and oriented x 3 PSYCHIATRIC:  Normal affect   ASSESSMENT:    1. Coronary artery disease involving native coronary artery of native heart without angina pectoris   2. Essential hypertension   3. Near syncope     PLAN:    In order of problems listed above:  Near syncope - In November appointment 2023 had an episode of lightheadedness nearly passed out following straining after bowel movement.  No loss of consciousness.  He had lost about 13 pounds in the past few months exercising more blood pressure probably too low.  Lisinopril was reduced to 10 mg.  Continuing with metoprolol 50 mg.  No changes made.  Continue with hydration.  Likely dehydrated at the time.  Also vagal type maneuvers.  Coronary artery disease -RCA stent 2018 in the setting of angina -Previously had inferior T wave inversions on EKG. -Continuing  with aspirin.  Completed dual antiplatelet therapy. -Previously discontinued his low-dose isosorbide due to lack of anginal symptoms.  Continuing to do well. -We will check an echocardiogram to ensure proper structure and function especially in the setting of recent syncope.  Mixed hyperlipidemia -LDL 198 with Crestor LDL 32.  Excellent.  Continue, no myalgias.  Most recent LDL 32.  Continue work on diet.  Hemoglobin 15 creatinine 1.1 potassium 4.7 ALT 32.  Essential hypertension -Well-controlled, medications reviewed.  Blood pressure at home cuff seems to be higher than what it is here.  I still want him to be on the lisinopril 10 mg once a day.  I think that this will be helpful for him.  I do not want to over medicate him.  He has lost 20 or so pounds.  Prior shingles -2018, scalp.  Had Bell's palsy.  Overall doing well.  Vaccinated.  Nephrolithiasis -Left ureteral stone.  Prior images reviewed on CT scan.  Depression - Underwent transcranial magnetic therapy.  About 35 sessions.  Was on FMLA   Checking echocardiogram.  1 year follow-up.   Medication Adjustments/Labs and Tests Ordered: Current medicines are reviewed at length with the patient today.  Concerns regarding medicines are outlined above.  Orders Placed This Encounter  Procedures   ECHOCARDIOGRAM COMPLETE   Meds ordered this encounter  Medications   nitroGLYCERIN (NITROSTAT) 0.4 MG SL tablet    Sig: Place 1 tablet (0.4 mg total) under the tongue every 5 (five) minutes as needed for chest pain.    Dispense:  25 tablet    Refill:  3    Patient Instructions  Medication Instructions:  The current medical regimen is effective;  continue present plan and medications.  *If you need a refill on your cardiac medications before your next appointment, please call your pharmacy*  Testing/Procedures: Your physician has requested that you have an echocardiogram. Echocardiography is a painless test that uses sound waves to  create images of your heart. It provides your doctor with information about the size and shape of your heart and how well your heart's chambers and valves are working. This procedure takes approximately one hour. There are no restrictions for this procedure. Please do NOT  wear cologne, perfume, aftershave, or lotions (deodorant is allowed). Please arrive 15 minutes prior to your appointment time.   Follow-Up: At [facility], you and your health needs are our priority.  As part of our continuing mission to provide you with exceptional heart care, we have created designated Provider Care Teams.  These Care Teams include your primary Cardiologist (physician) and Advanced Practice Providers (APPs -  Physician Assistants and Nurse Practitioners) who all work together to provide you with the care you need, when you need it.  We recommend signing up for the patient portal called "MyChart".  Sign up information is provided on this After Visit Summary.  MyChart is used to connect with patients for Virtual Visits (Telemedicine).  Patients are able to view lab/test results, encounter notes, upcoming appointments, etc.  Non-urgent messages can be sent to your provider as well.   To learn more about what you can do with MyChart, go to [email or web address].    Your next appointment:   1 year(s)  Provider:   Donato Schultz, MD        Signed, Donato Schultz, MD  10/14/2022 5:08 PM    [facility]

## 2022-10-24 ENCOUNTER — Ambulatory Visit (HOSPITAL_COMMUNITY): Payer: 59 | Attending: Cardiology

## 2022-10-24 DIAGNOSIS — I2510 Atherosclerotic heart disease of native coronary artery without angina pectoris: Secondary | ICD-10-CM | POA: Diagnosis not present

## 2022-10-24 DIAGNOSIS — I10 Essential (primary) hypertension: Secondary | ICD-10-CM | POA: Insufficient documentation

## 2022-10-24 LAB — ECHOCARDIOGRAM COMPLETE
Area-P 1/2: 2.88 cm2
S' Lateral: 3 cm

## 2022-11-22 ENCOUNTER — Encounter: Payer: Self-pay | Admitting: Family Medicine

## 2022-11-22 ENCOUNTER — Other Ambulatory Visit: Payer: Self-pay | Admitting: Family Medicine

## 2022-11-22 ENCOUNTER — Ambulatory Visit: Payer: 59 | Admitting: Family Medicine

## 2022-11-22 VITALS — BP 118/64 | HR 94 | Temp 98.2°F | Ht 67.0 in | Wt 186.0 lb

## 2022-11-22 DIAGNOSIS — M25512 Pain in left shoulder: Secondary | ICD-10-CM

## 2022-11-22 DIAGNOSIS — I10 Essential (primary) hypertension: Secondary | ICD-10-CM

## 2022-11-22 DIAGNOSIS — K219 Gastro-esophageal reflux disease without esophagitis: Secondary | ICD-10-CM

## 2022-11-22 DIAGNOSIS — E1165 Type 2 diabetes mellitus with hyperglycemia: Secondary | ICD-10-CM

## 2022-11-22 LAB — LIPID PANEL
Cholesterol: 130 mg/dL (ref 0–200)
HDL: 40.2 mg/dL (ref >39.00)
NonHDL: 89.67
Total CHOL/HDL Ratio: 3
Triglycerides: 244 mg/dL — ABNORMAL HIGH (ref 0.0–149.0)
VLDL: 48.8 mg/dL — ABNORMAL HIGH (ref 0.0–40.0)

## 2022-11-22 LAB — COMPREHENSIVE METABOLIC PANEL
ALT: 34 U/L (ref 0–53)
AST: 24 U/L (ref 0–37)
Albumin: 4.3 g/dL (ref 3.5–5.2)
Alkaline Phosphatase: 65 U/L (ref 39–117)
BUN: 17 mg/dL (ref 6–23)
CO2: 30 mEq/L (ref 19–32)
Calcium: 9.8 mg/dL (ref 8.4–10.5)
Chloride: 102 mEq/L (ref 96–112)
Creatinine, Ser: 1.06 mg/dL (ref 0.40–1.50)
GFR: 76.75 mL/min (ref >60.00)
Glucose, Bld: 122 mg/dL — ABNORMAL HIGH (ref 70–99)
Potassium: 4.3 mEq/L (ref 3.5–5.1)
Sodium: 139 mEq/L (ref 135–145)
Total Bilirubin: 0.6 mg/dL (ref 0.2–1.2)
Total Protein: 6.5 g/dL (ref 6.0–8.3)

## 2022-11-22 LAB — HEMOGLOBIN A1C: Hgb A1c MFr Bld: 6.8 % — ABNORMAL HIGH (ref 4.6–6.5)

## 2022-11-22 LAB — MICROALBUMIN / CREATININE URINE RATIO
Creatinine,U: 123.1 mg/dL
Microalb Creat Ratio: 0.6 mg/g (ref 0.0–30.0)
Microalb, Ur: <0.7 mg/dL (ref 0.0–1.9)

## 2022-11-22 LAB — LDL CHOLESTEROL, DIRECT: Direct LDL: 65 mg/dL

## 2022-11-22 MED ORDER — MELOXICAM 15 MG PO TABS: 15.0000 mg | ORAL_TABLET | Freq: Every day | ORAL | 0 refills | Status: DC

## 2022-11-22 NOTE — Progress Notes (Signed)
Subjective:   Chief Complaint  Patient presents with   Follow-up    [place] out of bed and hurt his left arm    Jordan Hendricks is a 62 y.o. male here for follow-up of diabetes.   Jordan Hendricks does not monitor his sugars routinely at this point.  Patient does not require insulin.   Medications include: Diet controlled Diet is healthy.  Exercise: walking  Hypertension Patient presents for hypertension follow up. He does not monitor home blood pressures. He is compliant with medication- lisinopril 10 mg Patient has these side effects of medication: none Diet/exercise as above.  No Cp or SOB.   GERD History of reflux currently taking Protonix 40 mg daily.  He reports compliance and no adverse effects.  Symptoms are well-controlled.  No bleeding, unintentional weight loss, difficulty swallowing.  L shoulder pain 1 week ago, was dreaming and felt out of a bed and landed on his L shoulder and ear. Since that time has had L shoulder and decreased ROM. Has improved but still there. Pain on top of L shoulder. Dull pain.   Past Medical History:  Diagnosis Date   Anginal pain ([place])    Coronary artery disease    Depression    Essential hypertension 07/13/2016   Family history of adverse reaction to anesthesia    " My Dad had bad reactions ,He would wake up & have hallucinations"   GERD (gastroesophageal reflux disease)    History of chicken pox    History of colon polyps    Hyperlipidemia      Related testing: Retinal exam: Done Pneumovax: done  Objective:  BP 118/64 (BP Location: Left Arm, Patient Position: Sitting, Cuff Size: Normal)   Pulse 94   Temp 98.2 F (36.8 C) (Oral)   Ht '5\' 7"'$  (1.702 m)   Wt 186 lb (84.4 kg)   SpO2 97%   BMI 29.13 kg/m  General:  Well developed, well nourished, in no apparent distress Skin:  Warm, no pallor or diaphoresis Lungs:  CTAB, no access msc use Cardio:  RRR, no bruits, no LE edema Musculoskeletal: Decreased active forward flexion, normal  passive range of motion.  Positive Neer's, Hawkins, empty can, equivocal speeds, liftoff, and crossover secondary to simply every movement causing pain.  There is TTP over the left trapezius but not over any bony landmarks or deltoid muscle. Neuro: DTRs equal and symmetric throughout upper extremities, grip strength is adequate. Psych: Age appropriate judgment and insight  Assessment:   Type 2 diabetes mellitus with hyperglycemia, without long-term current use of insulin (HCC) - Plan: Comprehensive metabolic panel, Lipid panel, Hemoglobin A1c, Microalbumin / creatinine urine ratio  Essential hypertension  Gastroesophageal reflux disease, unspecified whether esophagitis present  Acute pain of left shoulder - Plan: meloxicam (MOBIC) 15 MG tablet   Plan:   Chronic, hopefully stable. Cont Crestor 40 mg/d. Counseled on diet and exercise. Chronic, stable. Cont lisinopril 10 mg/d, Toprol XL.  Chronic, stable.  Continue Protonix 40 mg daily.  No red flag signs/symptoms. Short course of Mobic, stretches and exercises, heat, Tylenol, ice.  If no improvement the next week, he will send a message and we will refer him to the orthopedic surgery team.  It is not obvious that he tore his rotator cuff given the diffuse inflammation today. F/u in 6 mo. The patient voiced understanding and agreement to the plan.  [place], DO 11/22/22 9:06 AM

## 2022-11-22 NOTE — Patient Instructions (Addendum)
Give [place] 2-3 business days to get the results of your labs back.   Keep the diet clean and stay active.  Ice/cold pack over area for 10-15 min twice daily.  OK to take Tylenol 1000 mg (2 extra strength tabs) or 975 mg (3 regular strength tabs) every 6 hours as needed.  Send me a message next week if we aren't doing well with your shoulder.   Let us know if you need anything.  EXERCISES  RANGE OF MOTION (ROM) AND STRETCHING EXERCISES These exercises may help you when beginning to rehabilitate your injury. While completing these exercises, remember:  Restoring tissue flexibility helps normal motion to return to the joints. This allows healthier, less painful movement and activity. An effective stretch should be held for at least 30 seconds. A stretch should never be painful. You should only feel a gentle lengthening or release in the stretched tissue.  ROM - Pendulum Bend at the waist so that your right / left arm falls away from your body. Support yourself with your opposite hand on a solid surface, such as a table or a countertop. Your right / left arm should be perpendicular to the ground. If it is not perpendicular, you need to lean over farther. Relax the muscles in your right / left arm and shoulder as much as possible. Gently sway your hips and trunk so they move your right / left arm without any use of your right / left shoulder muscles. Progress your movements so that your right / left arm moves side to side, then forward and backward, and finally, both clockwise and counterclockwise. Complete 10-15 repetitions in each direction. Many people use this exercise to relieve discomfort in their shoulder as well as to gain range of motion. Repeat 2 times. Complete this exercise 3 times per week.  STRETCH - Flexion, Standing Stand with good posture. With an underhand grip on your right / left hand and an overhand grip on the opposite hand, grasp a broomstick or cane so that your hands are  a little more than shoulder-width apart. Keeping your right / left elbow straight and shoulder muscles relaxed, push the stick with your opposite hand to raise your right / left arm in front of your body and then overhead. Raise your arm until you feel a stretch in your right / left shoulder, but before you have increased shoulder pain. Try to avoid shrugging your right / left shoulder as your arm rises by keeping your shoulder blade tucked down and toward your mid-back spine. Hold 30 seconds. Slowly return to the starting position. Repeat 2 times. Complete this exercise 3 times per week.  STRETCH - Internal Rotation Place your right / left hand behind your back, palm-up. Throw a towel or belt over your opposite shoulder. Grasp the towel/belt with your right / left hand. While keeping an upright posture, gently pull up on the towel/belt until you feel a stretch in the front of your right / left shoulder. Avoid shrugging your right / left shoulder as your arm rises by keeping your shoulder blade tucked down and toward your mid-back spine. Hold 30. Release the stretch by lowering your opposite hand. Repeat 2 times. Complete this exercise 3 times per week.  STRETCH - External Rotation and Abduction Stagger your stance through a doorframe. It does not matter which foot is forward. As instructed by your physician, physical therapist or athletic trainer, place your hands: And forearms above your head and on the door frame. And  forearms at head-height and on the door frame. At elbow-height and on the door frame. Keeping your head and chest upright and your stomach muscles tight to prevent over-extending your low-back, slowly shift your weight onto your front foot until you feel a stretch across your chest and/or in the front of your shoulders. Hold 30 seconds. Shift your weight to your back foot to release the stretch. Repeat 2 times. Complete this stretch 3 times per week.   STRENGTHENING EXERCISES   These exercises may help you when beginning to rehabilitate your injury. They may resolve your symptoms with or without further involvement from your physician, physical therapist or athletic trainer. While completing these exercises, remember:  Muscles can gain both the endurance and the strength needed for everyday activities through controlled exercises. Complete these exercises as instructed by your physician, physical therapist or athletic trainer. Progress the resistance and repetitions only as guided. You may experience muscle soreness or fatigue, but the pain or discomfort you are trying to eliminate should never worsen during these exercises. If this pain does worsen, stop and make certain you are following the directions exactly. If the pain is still present after adjustments, discontinue the exercise until you can discuss the trouble with your clinician. If advised by your physician, during your recovery, avoid activity or exercises which involve actions that place your right / left hand or elbow above your head or behind your back or head. These positions stress the tissues which are trying to heal.  STRENGTH - Scapular Depression and Adduction With good posture, sit on a firm chair. Supported your arms in front of you with pillows, arm rests or a table top. Have your elbows in line with the sides of your body. Gently draw your shoulder blades down and toward your mid-back spine. Gradually increase the tension without tensing the muscles along the top of your shoulders and the back of your neck. Hold for 3 seconds. Slowly release the tension and relax your muscles completely before completing the next repetition. After you have practiced this exercise, remove the arm support and complete it in standing as well as sitting. Repeat 2 times. Complete this exercise 3 times per week.   STRENGTH - External Rotators Secure a rubber exercise band/tubing to a fixed object so that it is at the same  height as your right / left elbow when you are standing or sitting on a firm surface. Stand or sit so that the secured exercise band/tubing is at your side that is not injured. Bend your elbow 90 degrees. Place a folded towel or small pillow under your right / left arm so that your elbow is a few inches away from your side. Keeping the tension on the exercise band/tubing, pull it away from your body, as if pivoting on your elbow. Be sure to keep your body steady so that the movement is only coming from your shoulder rotating. Hold 3 seconds. Release the tension in a controlled manner as you return to the starting position. Repeat 2 times. Complete this exercise 3 times per week.   STRENGTH - Supraspinatus Stand or sit with good posture. Grasp a 2-3 lb weight or an exercise band/tubing so that your hand is "thumbs-up," like when you shake hands. Slowly lift your right / left hand from your thigh into the air, traveling about 30 degrees from straight out at your side. Lift your hand to shoulder height or as far as you can without increasing any shoulder pain. Initially, many  people do not lift their hands above shoulder height. Avoid shrugging your right / left shoulder as your arm rises by keeping your shoulder blade tucked down and toward your mid-back spine. Hold for 3 seconds. Control the descent of your hand as you slowly return to your starting position. Repeat 2 times. Complete this exercise 3 times per week.   STRENGTH - Shoulder Extensors Secure a rubber exercise band/tubing so that it is at the height of your shoulders when you are either standing or sitting on a firm arm-less chair. With a thumbs-up grip, grasp an end of the band/tubing in each hand. Straighten your elbows and lift your hands straight in front of you at shoulder height. Step back away from the secured end of band/tubing until it becomes tense. Squeezing your shoulder blades together, pull your hands down to the sides of  your thighs. Do not allow your hands to go behind you. Hold for 3 seconds. Slowly ease the tension on the band/tubing as you reverse the directions and return to the starting position. Repeat 2 times. Complete this exercise 3 times per week.   STRENGTH - Scapular Retractors Secure a rubber exercise band/tubing so that it is at the height of your shoulders when you are either standing or sitting on a firm arm-less chair. With a palm-down grip, grasp an end of the band/tubing in each hand. Straighten your elbows and lift your hands straight in front of you at shoulder height. Step back away from the secured end of band/tubing until it becomes tense. Squeezing your shoulder blades together, draw your elbows back as you bend them. Keep your upper arm lifted away from your body throughout the exercise. Hold 3 seconds. Slowly ease the tension on the band/tubing as you reverse the directions and return to the starting position. Repeat 2 times. Complete this exercise 3 times per week.  STRENGTH - Scapular Depressors Find a sturdy chair without wheels, such as a from a dining room table. Keeping your feet on the floor, lift your bottom from the seat and lock your elbows. Keeping your elbows straight, allow gravity to pull your body weight down. Your shoulders will rise toward your ears. Raise your body against gravity by drawing your shoulder blades down your back, shortening the distance between your shoulders and ears. Although your feet should always maintain contact with the floor, your feet should progressively support less body weight as you get stronger. Hold 3 seconds. In a controlled and slow manner, lower your body weight to begin the next repetition. Repeat 2 times. Complete this exercise 3 times per week.    This information is not intended to replace advice given to you by your health care provider. Make sure you discuss any questions you have with your health care provider.   Document  Released: 07/20/2005 Document Revised: 09/26/2014 Document Reviewed: 12/18/2008 Elsevier Interactive Patient Education [employer].

## 2022-11-23 ENCOUNTER — Other Ambulatory Visit: Payer: Self-pay | Admitting: Family Medicine

## 2022-11-23 DIAGNOSIS — E7800 Pure hypercholesterolemia, unspecified: Secondary | ICD-10-CM

## 2022-11-23 MED ORDER — FENOFIBRATE 48 MG PO TABS: 48.0000 mg | ORAL_TABLET | Freq: Every day | ORAL | 3 refills | Status: DC

## 2022-12-13 ENCOUNTER — Encounter: Payer: Self-pay | Admitting: Cardiology

## 2022-12-13 MED ORDER — METOPROLOL SUCCINATE ER 50 MG PO TB24: 50.0000 mg | ORAL_TABLET | Freq: Every day | ORAL | 3 refills | Status: DC

## 2022-12-26 ENCOUNTER — Telehealth: Payer: Self-pay | Admitting: Family Medicine

## 2022-12-26 NOTE — Telephone Encounter (Signed)
Pt called to set up an appt for Tdap vaccine. Set up appt for 4.24.24 @ 9a and need order for vaccine when possible

## 2023-01-01 ENCOUNTER — Encounter: Payer: Self-pay | Admitting: Family Medicine

## 2023-01-02 MED ORDER — FENOFIBRATE 48 MG PO TABS: 48.0000 mg | ORAL_TABLET | Freq: Every day | ORAL | 3 refills | Status: DC

## 2023-01-11 ENCOUNTER — Ambulatory Visit (INDEPENDENT_AMBULATORY_CARE_PROVIDER_SITE_OTHER): Payer: 59

## 2023-01-11 DIAGNOSIS — Z23 Encounter for immunization: Secondary | ICD-10-CM

## 2023-01-11 NOTE — Progress Notes (Signed)
Pt here today for the Tdap vaccine per Dr Carmelia Roller. Injection was given intramuscular in left deltoid. Pt tolerated well.

## 2023-02-22 ENCOUNTER — Encounter: Payer: Self-pay | Admitting: Family Medicine

## 2023-02-28 ENCOUNTER — Ambulatory Visit: Payer: 59 | Admitting: Family Medicine

## 2023-02-28 ENCOUNTER — Encounter: Payer: Self-pay | Admitting: Family Medicine

## 2023-02-28 VITALS — BP 118/80 | HR 78 | Temp 98.9°F | Ht 67.0 in | Wt 193.5 lb

## 2023-02-28 DIAGNOSIS — S46812A Strain of other muscles, fascia and tendons at shoulder and upper arm level, left arm, initial encounter: Secondary | ICD-10-CM | POA: Diagnosis not present

## 2023-02-28 DIAGNOSIS — M25512 Pain in left shoulder: Secondary | ICD-10-CM

## 2023-02-28 MED ORDER — MELOXICAM 15 MG PO TABS: 15.0000 mg | ORAL_TABLET | Freq: Every day | ORAL | 0 refills | Status: DC
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
## 2023-02-28 NOTE — Patient Instructions (Signed)
Heat (pad or rice pillow in microwave) over affected area, 10-15 minutes twice daily.   Ice/cold pack over area for 10-15 min twice daily.  OK to take Tylenol 1000 mg (2 extra strength tabs) or 975 mg (3 regular strength tabs) every 6 hours as needed.  If you do not hear anything about your referral in the next 1-2 weeks, call our office and ask for an update.  Let us know if you need anything.  EXERCISES  RANGE OF MOTION (ROM) AND STRETCHING EXERCISES These exercises may help you when beginning to rehabilitate your injury. While completing these exercises, remember:  Restoring tissue flexibility helps normal motion to return to the joints. This allows healthier, less painful movement and activity. An effective stretch should be held for at least 30 seconds. A stretch should never be painful. You should only feel a gentle lengthening or release in the stretched tissue.  ROM - Pendulum Bend at the waist so that your right / left arm falls away from your body. Support yourself with your opposite hand on a solid surface, such as a table or a countertop. Your right / left arm should be perpendicular to the ground. If it is not perpendicular, you need to lean over farther. Relax the muscles in your right / left arm and shoulder as much as possible. Gently sway your hips and trunk so they move your right / left arm without any use of your right / left shoulder muscles. Progress your movements so that your right / left arm moves side to side, then forward and backward, and finally, both clockwise and counterclockwise. Complete 10-15 repetitions in each direction. Many people use this exercise to relieve discomfort in their shoulder as well as to gain range of motion. Repeat 2 times. Complete this exercise 3 times per week.  STRETCH - Flexion, Standing Stand with good posture. With an underhand grip on your right / left hand and an overhand grip on the opposite hand, grasp a broomstick or cane so  that your hands are a little more than shoulder-width apart. Keeping your right / left elbow straight and shoulder muscles relaxed, push the stick with your opposite hand to raise your right / left arm in front of your body and then overhead. Raise your arm until you feel a stretch in your right / left shoulder, but before you have increased shoulder pain. Try to avoid shrugging your right / left shoulder as your arm rises by keeping your shoulder blade tucked down and toward your mid-back spine. Hold 30 seconds. Slowly return to the starting position. Repeat 2 times. Complete this exercise 3 times per week.  STRETCH - Internal Rotation Place your right / left hand behind your back, palm-up. Throw a towel or belt over your opposite shoulder. Grasp the towel/belt with your right / left hand. While keeping an upright posture, gently pull up on the towel/belt until you feel a stretch in the front of your right / left shoulder. Avoid shrugging your right / left shoulder as your arm rises by keeping your shoulder blade tucked down and toward your mid-back spine. Hold 30. Release the stretch by lowering your opposite hand. Repeat 2 times. Complete this exercise 3 times per week.  STRETCH - External Rotation and Abduction Stagger your stance through a doorframe. It does not matter which foot is forward. As instructed by your physician, physical therapist or athletic trainer, place your hands: And forearms above your head and on the door frame. And  forearms at head-height and on the door frame. At elbow-height and on the door frame. Keeping your head and chest upright and your stomach muscles tight to prevent over-extending your low-back, slowly shift your weight onto your front foot until you feel a stretch across your chest and/or in the front of your shoulders. Hold 30 seconds. Shift your weight to your back foot to release the stretch. Repeat 2 times. Complete this stretch 3 times per week.    STRENGTHENING EXERCISES  These exercises may help you when beginning to rehabilitate your injury. They may resolve your symptoms with or without further involvement from your physician, physical therapist or athletic trainer. While completing these exercises, remember:  Muscles can gain both the endurance and the strength needed for everyday activities through controlled exercises. Complete these exercises as instructed by your physician, physical therapist or athletic trainer. Progress the resistance and repetitions only as guided. You may experience muscle soreness or fatigue, but the pain or discomfort you are trying to eliminate should never worsen during these exercises. If this pain does worsen, stop and make certain you are following the directions exactly. If the pain is still present after adjustments, discontinue the exercise until you can discuss the trouble with your clinician. If advised by your physician, during your recovery, avoid activity or exercises which involve actions that place your right / left hand or elbow above your head or behind your back or head. These positions stress the tissues which are trying to heal.  STRENGTH - Scapular Depression and Adduction With good posture, sit on a firm chair. Supported your arms in front of you with pillows, arm rests or a table top. Have your elbows in line with the sides of your body. Gently draw your shoulder blades down and toward your mid-back spine. Gradually increase the tension without tensing the muscles along the top of your shoulders and the back of your neck. Hold for 3 seconds. Slowly release the tension and relax your muscles completely before completing the next repetition. After you have practiced this exercise, remove the arm support and complete it in standing as well as sitting. Repeat 2 times. Complete this exercise 3 times per week.   STRENGTH - External Rotators Secure a rubber exercise band/tubing to a fixed  object so that it is at the same height as your right / left elbow when you are standing or sitting on a firm surface. Stand or sit so that the secured exercise band/tubing is at your side that is not injured. Bend your elbow 90 degrees. Place a folded towel or small pillow under your right / left arm so that your elbow is a few inches away from your side. Keeping the tension on the exercise band/tubing, pull it away from your body, as if pivoting on your elbow. Be sure to keep your body steady so that the movement is only coming from your shoulder rotating. Hold 3 seconds. Release the tension in a controlled manner as you return to the starting position. Repeat 2 times. Complete this exercise 3 times per week.   STRENGTH - Supraspinatus Stand or sit with good posture. Grasp a 2-3 lb weight or an exercise band/tubing so that your hand is "thumbs-up," like when you shake hands. Slowly lift your right / left hand from your thigh into the air, traveling about 30 degrees from straight out at your side. Lift your hand to shoulder height or as far as you can without increasing any shoulder pain. Initially, many  people do not lift their hands above shoulder height. Avoid shrugging your right / left shoulder as your arm rises by keeping your shoulder blade tucked down and toward your mid-back spine. Hold for 3 seconds. Control the descent of your hand as you slowly return to your starting position. Repeat 2 times. Complete this exercise 3 times per week.   STRENGTH - Shoulder Extensors Secure a rubber exercise band/tubing so that it is at the height of your shoulders when you are either standing or sitting on a firm arm-less chair. With a thumbs-up grip, grasp an end of the band/tubing in each hand. Straighten your elbows and lift your hands straight in front of you at shoulder height. Step back away from the secured end of band/tubing until it becomes tense. Squeezing your shoulder blades together, pull  your hands down to the sides of your thighs. Do not allow your hands to go behind you. Hold for 3 seconds. Slowly ease the tension on the band/tubing as you reverse the directions and return to the starting position. Repeat 2 times. Complete this exercise 3 times per week.   STRENGTH - Scapular Retractors Secure a rubber exercise band/tubing so that it is at the height of your shoulders when you are either standing or sitting on a firm arm-less chair. With a palm-down grip, grasp an end of the band/tubing in each hand. Straighten your elbows and lift your hands straight in front of you at shoulder height. Step back away from the secured end of band/tubing until it becomes tense. Squeezing your shoulder blades together, draw your elbows back as you bend them. Keep your upper arm lifted away from your body throughout the exercise. Hold 3 seconds. Slowly ease the tension on the band/tubing as you reverse the directions and return to the starting position. Repeat 2 times. Complete this exercise 3 times per week.  STRENGTH - Scapular Depressors Find a sturdy chair without wheels, such as a from a dining room table. Keeping your feet on the floor, lift your bottom from the seat and lock your elbows. Keeping your elbows straight, allow gravity to pull your body weight down. Your shoulders will rise toward your ears. Raise your body against gravity by drawing your shoulder blades down your back, shortening the distance between your shoulders and ears. Although your feet should always maintain contact with the floor, your feet should progressively support less body weight as you get stronger. Hold 3 seconds. In a controlled and slow manner, lower your body weight to begin the next repetition. Repeat 2 times. Complete this exercise 3 times per week.    This information is not intended to replace advice given to you by your health care provider. Make sure you discuss any questions you have with your health  care provider.   Document Released: 07/20/2005 Document Revised: 09/26/2014 Document Reviewed: 12/18/2008 [employer] Interactive Patient Education 2016 [employer]  Trapezius stretches/exercises Do exercises exactly as told by your health care provider and adjust them as directed. It is normal to feel mild stretching, pulling, tightness, or discomfort as you do these exercises, but you should stop right away if you feel sudden pain or your pain gets worse.   Stretching and range of motion exercises These exercises warm up your muscles and joints and improve the movement and flexibility of your shoulder. These exercises can also help to relieve pain, numbness, and tingling. If you are unable to do any of the following for any reason, do not further attempt  to do it.   Exercise A: Flexion, standing     Stand and hold a broomstick, a cane, or a similar object. Place your hands a little more than shoulder-width apart on the object. Your left / right hand should be palm-up, and your other hand should be palm-down. Push the stick to raise your left / right arm out to your side and then over your head. Use your other hand to help move the stick. Stop when you feel a stretch in your shoulder, or when you reach the angle that is recommended by your health care provider. Avoid shrugging your shoulder while you raise your arm. Keep your shoulder blade tucked down toward your spine. Hold for 30 seconds. Slowly return to the starting position. Repeat 2 times. Complete this exercise 3 times per week.  Exercise B: Abduction, supine     Lie on your back and hold a broomstick, a cane, or a similar object. Place your hands a little more than shoulder-width apart on the object. Your left / right hand should be palm-up, and your other hand should be palm-down. Push the stick to raise your left / right arm out to your side and then over your head. Use your other hand to help move the stick. Stop when you feel a  stretch in your shoulder, or when you reach the angle that is recommended by your health care provider. Avoid shrugging your shoulder while you raise your arm. Keep your shoulder blade tucked down toward your spine. Hold for 30 seconds. Slowly return to the starting position. Repeat 2 times. Complete this exercise 3 times per week.  Exercise C: Flexion, active-assisted     Lie on your back. You may bend your knees for comfort. Hold a broomstick, a cane, or a similar object. Place your hands about shoulder-width apart on the object. Your palms should face toward your feet. Raise the stick and move your arms over your head and behind your head, toward the floor. Use your healthy arm to help your left / right arm move farther. Stop when you feel a gentle stretch in your shoulder, or when you reach the angle where your health care provider tells you to stop. Hold for 30 seconds. Slowly return to the starting position. Repeat 2 times. Complete this exercise 3 times per week.  Exercise D: External rotation and abduction     Stand in a door frame with one of your feet slightly in front of the other. This is called a staggered stance. Choose one of the following positions as told by your health care provider: Place your hands and forearms on the door frame above your head. Place your hands and forearms on the door frame at the height of your head. Place your hands on the door frame at the height of your elbows. Slowly move your weight onto your front foot until you feel a stretch across your chest and in the front of your shoulders. Keep your head and chest upright and keep your abdominal muscles tight. Hold for 30 seconds. To release the stretch, shift your weight to your back foot. Repeat 2 times. Complete this stretch 3 times per week.  Strengthening exercises These exercises build strength and endurance in your shoulder. Endurance is the ability to use your muscles for a long time, even  after your muscles get tired. Exercise E: Scapular depression and adduction  Sit on a stable chair. Support your arms in front of you with pillows, armrests, or  a tabletop. Keep your elbows in line with the sides of your body. Gently move your shoulder blades down toward your middle back. Relax the muscles on the tops of your shoulders and in the back of your neck. Hold for 3 seconds. Slowly release the tension and relax your muscles completely before doing this exercise again. Repeat for a total of 10 repetitions. After you have practiced this exercise, try doing the exercise without the arm support. Then, try the exercise while standing instead of sitting. Repeat 2 times. Complete this exercise 3 times per week.  Exercise F: Shoulder abduction, isometric     Stand or sit about 4-6 inches (10-15 cm) from a wall with your left / right side facing the wall. Bend your left / right elbow and gently press your elbow against the wall. Increase the pressure slowly until you are pressing as hard as you can without shrugging your shoulder. Hold for 3 seconds. Slowly release the tension and relax your muscles completely. Repeat for a total of 10 repetitions. Repeat 2 times. Complete this exercise 3 times per week.  Exercise G: Shoulder flexion, isometric     Stand or sit about 4-6 inches (10-15 cm) away from a wall with your left / right side facing the wall. Keep your left / right elbow straight and gently press the top of your fist against the wall. Increase the pressure slowly until you are pressing as hard as you can without shrugging your shoulder. Hold for 10-15 seconds. Slowly release the tension and relax your muscles completely. Repeat for a total of 10 repetitions. Repeat 2 times. Complete this exercise 3 times per week.  Exercise H: Internal rotation     Sit in a stable chair without armrests, or stand. Secure an exercise band at your left / right side, at elbow height. Place a soft  object, such as a folded towel or a small pillow, under your left / right upper arm so your elbow is a few inches (about 8 cm) away from your side. Hold the end of the exercise band so the band stretches. Keeping your elbow pressed against the soft object under your arm, move your forearm across your body toward your abdomen. Keep your body steady so the movement is only coming from your shoulder. Hold for 3 seconds. Slowly return to the starting position. Repeat for a total of 10 repetitions. Repeat 2 times. Complete this exercise 3 times per week.  Exercise I: External rotation     Sit in a stable chair without armrests, or stand. Secure an exercise band at your left / right side, at elbow height. Place a soft object, such as a folded towel or a small pillow, under your left / right upper arm so your elbow is a few inches (about 8 cm) away from your side. Hold the end of the exercise band so the band stretches. Keeping your elbow pressed against the soft object under your arm, move your forearm out, away from your abdomen. Keep your body steady so the movement is only coming from your shoulder. Hold for 3 seconds. Slowly return to the starting position. Repeat for a total of 10 repetitions. Repeat 2 times. Complete this exercise 3 times per week. Exercise J: Shoulder extension  Sit in a stable chair without armrests, or stand. Secure an exercise band to a stable object in front of you so the band is at shoulder height. Hold one end of the exercise band in each hand. Your  palms should face each other. Straighten your elbows and lift your hands up to shoulder height. Step back, away from the secured end of the exercise band, until the band stretches. Squeeze your shoulder blades together and pull your hands down to the sides of your thighs. Stop when your hands are straight down by your sides. Do not let your hands go behind your body. Hold for 3 seconds. Slowly return to the starting  position. Repeat for a total of 10 repetitions. Repeat 2 times. Complete this exercise 3 times per week.  Exercise K: Shoulder extension, prone     Lie on your abdomen on a firm surface so your left / right arm hangs over the edge. Hold a 5 lb weight in your hand so your palm faces in toward your body. Your arm should be straight. Squeeze your shoulder blade down toward the middle of your back. Slowly raise your arm behind you, up to the height of the surface that you are lying on. Keep your arm straight. Hold for 3 seconds. Slowly return to the starting position and relax your muscles. Repeat for a total of 10 repetitions. Repeat 2 times. Complete this exercise 3 times per week.   Exercise L: Horizontal abduction, prone  Lie on your abdomen on a firm surface so your left / right arm hangs over the edge. Hold a 5 lb weight in your hand so your palm faces toward your feet. Your arm should be straight. Squeeze your shoulder blade down toward the middle of your back. Bend your elbow so your hand moves up, until your elbow is bent to an "L" shape (90 degrees). With your elbow bent, slowly move your forearm forward and up. Raise your hand up to the height of the surface that you are lying on. Your upper arm should not move, and your elbow should stay bent. At the top of the movement, your palm should face the floor. Hold for 3 seconds. Slowly return to the starting position and relax your muscles. Repeat for a total of 10 repetitions. Repeat 2 times. Complete this exercise 3 times per week.  Exercise M: Horizontal abduction, standing  Sit on a stable chair, or stand. Secure an exercise band to a stable object in front of you so the band is at shoulder height. Hold one end of the exercise band in each hand. Straighten your elbows and lift your hands straight in front of you, up to shoulder height. Your palms should face down, toward the floor. Step back, away from the secured end of the  exercise band, until the band stretches. Move your arms out to your sides, and keep your arms straight. Hold for 3 seconds. Slowly return to the starting position. Repeat for a total of 10 repetitions. Repeat 2 times. Complete this exercise 3 times per week.  Exercise N: Scapular retraction and elevation  Sit on a stable chair, or stand. Secure an exercise band to a stable object in front of you so the band is at shoulder height. Hold one end of the exercise band in each hand. Your palms should face each other. Sit in a stable chair without armrests, or stand. Step back, away from the secured end of the exercise band, until the band stretches. Squeeze your shoulder blades together and lift your hands over your head. Keep your elbows straight. Hold for 3 seconds. Slowly return to the starting position. Repeat for a total of 10 repetitions. Repeat 2 times. Complete this exercise 3  times per week.  This information is not intended to replace advice given to you by your health care provider. Make sure you discuss any questions you have with your health care provider. Document Released: 09/05/2005 Document Revised: 05/12/2016 Document Reviewed: 07/23/2015 [employer] Interactive Patient Education  2017 [employer].

## 2023-03-14 ENCOUNTER — Other Ambulatory Visit (INDEPENDENT_AMBULATORY_CARE_PROVIDER_SITE_OTHER): Payer: 59

## 2023-03-14 ENCOUNTER — Ambulatory Visit (INDEPENDENT_AMBULATORY_CARE_PROVIDER_SITE_OTHER): Payer: 59 | Admitting: Orthopaedic Surgery

## 2023-03-14 DIAGNOSIS — M25512 Pain in left shoulder: Secondary | ICD-10-CM

## 2023-03-14 DIAGNOSIS — M542 Cervicalgia: Secondary | ICD-10-CM | POA: Diagnosis not present

## 2023-03-14 NOTE — Progress Notes (Signed)
Office Visit Note   Patient: Jordan Hendricks           Date of Birth: October 19, 1960           MRN: [id number] Visit Date: 03/14/2023              Requested by: Sharlene Dory, DO [address] [place],  [place] [address] PCP: Sharlene Dory, DO   Assessment & Plan: Visit Diagnoses:  1. Acute pain of left shoulder   2. Cervicalgia     Plan: Jordan Hendricks is a 62 year old gentleman with acute left shoulder pain.  Impression is traumatic rotator cuff tear and possible SLAP tear resulting from syncopal fall on April 29.  I will order an MR arthrogram at this time.  We will be in touch after the MRI.  Follow-Up Instructions: No follow-ups on file.   Orders:  Orders Placed This Encounter  Procedures   XR Shoulder Left   XR Cervical Spine 2 or 3 views   No orders of the defined types were placed in this encounter.     Procedures: No procedures performed   Clinical Data: No additional findings.   Subjective: Chief Complaint  Patient presents with   Left Shoulder - Pain    HPI Patient is a very pleasant 62 year old gentleman here for chronic left shoulder pain from April 29.  He was giving blood and then had a syncopal fall and hit his shoulder and head.  He has seen his PCP and was given meloxicam which has not helped.  He is unable to sleep on his left side.  Denies any radicular symptoms. Review of Systems  Constitutional: Negative.   HENT: Negative.    Eyes: Negative.   Respiratory: Negative.    Cardiovascular: Negative.   Gastrointestinal: Negative.   Endocrine: Negative.   Genitourinary: Negative.   Skin: Negative.   Allergic/Immunologic: Negative.   Neurological: Negative.   Hematological: Negative.   Psychiatric/Behavioral: Negative.    All other systems reviewed and are negative.    Objective: Vital Signs: There were no vitals taken for this visit.  Physical Exam Vitals and nursing note reviewed.  Constitutional:       Appearance: He is well-developed.  HENT:     Head: Normocephalic and atraumatic.  Eyes:     Pupils: Pupils are equal, round, and reactive to light.  Pulmonary:     Effort: Pulmonary effort is normal.  Abdominal:     Palpations: Abdomen is soft.  Musculoskeletal:        General: Normal range of motion.     Cervical back: Neck supple.  Skin:    General: Skin is warm.  Neurological:     Mental Status: He is alert and oriented to person, place, and time.  Psychiatric:        Behavior: Behavior normal.        Thought Content: Thought content normal.        Judgment: Judgment normal.     Ortho Exam Examination left shoulder shows normal passive and active range of motion.  Negative impingement signs.  He has noticeable weakness to manual muscle testing of the supraspinatus and pain with O'Brien's test.  Cervical spine exam is unremarkable. Specialty Comments:  No specialty comments available.  Imaging: No results found.   PMFS History: Patient Active Problem List   Diagnosis Date Noted   Gastroesophageal reflux disease 11/22/2022   Type 2 diabetes mellitus with hyperglycemia, without long-term current use of  insulin (HCC) 06/01/2022   Major depressive disorder with single episode, in partial remission (HCC) 01/11/2021   Chest pain 10/07/2016   Stable angina 10/07/2016   Angina decubitus 09/30/2016   Family history of early CAD 09/30/2016   Pure hypercholesterolemia 09/30/2016   Essential hypertension 07/13/2016   Depression 07/13/2016   Past Medical History:  Diagnosis Date   Anginal pain (HCC)    Coronary artery disease    Depression    Essential hypertension 07/13/2016   Family history of adverse reaction to anesthesia    " My Dad had bad reactions ,He would wake up & have hallucinations"   GERD (gastroesophageal reflux disease)    History of chicken pox    History of colon polyps    Hyperlipidemia     Family History  Problem Relation Age of Onset   Heart  disease Father    Prostate cancer Father    Alzheimer's disease Father    Diabetes Father    Hypertension Father    Diabetes Maternal Grandmother    Alzheimer's disease Paternal Grandmother    Breast cancer Neg Hx     Past Surgical History:  Procedure Laterality Date   CARDIAC CATHETERIZATION  10/07/2016   CARDIAC CATHETERIZATION N/A 10/07/2016   Procedure: Left Heart Cath and Coronary Angiography;  Surgeon: Yvonne Kendall, MD;  Location: [facility] INVASIVE CV LAB;  Service: Cardiovascular;  Laterality: N/A;   CARDIAC CATHETERIZATION N/A 10/07/2016   Procedure: Coronary Stent Intervention;  Surgeon: Yvonne Kendall, MD;  Location: [facility] INVASIVE CV LAB;  Service: Cardiovascular;  Laterality: N/A;  RCA   PERCUTANEOUS CORONARY STENT INTERVENTION (PCI-S)  10/07/2016   RCA   REFRACTIVE SURGERY Bilateral    ROTATOR CUFF REPAIR Right 2015   WISDOM TOOTH EXTRACTION     Social History   Occupational History   Not on file  Tobacco Use   Smoking status: Never   Smokeless tobacco: Never  Vaping Use   Vaping Use: Never used  Substance and Sexual Activity   Alcohol use: Yes    Comment: social   Drug use: No   Sexual activity: Not on file

## 2023-03-14 NOTE — Addendum Note (Signed)
Addended by: Wendi Maya on: 03/14/2023 04:19 PM   Modules accepted: Orders

## 2023-04-17 ENCOUNTER — Ambulatory Visit
Admission: RE | Admit: 2023-04-17 | Discharge: 2023-04-17 | Disposition: A | Discharge status: Home / Self Care | Payer: 59 | Source: Ambulatory Visit | Attending: Orthopaedic Surgery | Admitting: Orthopaedic Surgery

## 2023-04-17 DIAGNOSIS — M25512 Pain in left shoulder: Secondary | ICD-10-CM

## 2023-04-17 MED ORDER — IOPAMIDOL (ISOVUE-M 200) INJECTION 41%
10.0000 mL | Freq: Once | INTRAMUSCULAR | Status: AC
  Administered 2023-04-17: 10 mL via INTRA_ARTICULAR

## 2023-04-25 ENCOUNTER — Encounter: Payer: Self-pay | Admitting: Orthopaedic Surgery

## 2023-04-25 ENCOUNTER — Ambulatory Visit: Payer: 59 | Admitting: Orthopaedic Surgery

## 2023-04-25 DIAGNOSIS — M7542 Impingement syndrome of left shoulder: Secondary | ICD-10-CM

## 2023-04-25 DIAGNOSIS — M19012 Primary osteoarthritis, left shoulder: Secondary | ICD-10-CM | POA: Diagnosis not present

## 2023-04-25 DIAGNOSIS — S46012A Strain of muscle(s) and tendon(s) of the rotator cuff of left shoulder, initial encounter: Secondary | ICD-10-CM

## 2023-04-25 DIAGNOSIS — M67922 Unspecified disorder of synovium and tendon, left upper arm: Secondary | ICD-10-CM | POA: Diagnosis not present

## 2023-04-25 NOTE — Progress Notes (Signed)
Office Visit Note   Patient: Jordan Hendricks           Date of Birth: 28-Aug-1961           MRN: [id number] Visit Date: 04/25/2023              Requested by: Sharlene Dory, DO [address] [place],  [place] [address] PCP: Sharlene Dory, DO   Assessment & Plan: Visit Diagnoses:  1. Traumatic complete tear of left rotator cuff, initial encounter   2. Impingement syndrome of left shoulder   3. Arthritis of left acromioclavicular joint   4. Tendinopathy of left biceps tendon     Plan: Jordan Hendricks is a 62 year old gentleman with traumatic left supraspinatus tear.  Other findings include traumatic biceps tear and AC joint arthritis and impingement syndrome.  These findings were reviewed with the patient and I have recommended arthroscopic rotator cuff repair, distal clavicle excision, biceps tenotomy, extensive debridement, subacromial decompression.  Details of the surgery including risk benefits prognosis recovery reviewed with the patient.  We will obtain preoperative cardiac clearance from Dr. Anne Fu.  Patient would like to look at surgery sometime in early September.  Eunice Blase will call the patient to confirm surgery date.  Follow-Up Instructions: No follow-ups on file.   Orders:  No orders of the defined types were placed in this encounter.  No orders of the defined types were placed in this encounter.     Procedures: No procedures performed   Clinical Data: No additional findings.   Subjective: Chief Complaint  Patient presents with   Left Shoulder - Follow-up    MRI review    HPI Jordan Hendricks returns today to review left shoulder MRI scan. Review of Systems  Constitutional: Negative.   HENT: Negative.    Eyes: Negative.   Respiratory: Negative.    Cardiovascular: Negative.   Gastrointestinal: Negative.   Endocrine: Negative.   Genitourinary: Negative.   Skin: Negative.   Allergic/Immunologic: Negative.   Neurological: Negative.    Hematological: Negative.   Psychiatric/Behavioral: Negative.    All other systems reviewed and are negative.    Objective: Vital Signs: There were no vitals taken for this visit.  Physical Exam Vitals and nursing note reviewed.  Constitutional:      Appearance: He is well-developed.  Pulmonary:     Effort: Pulmonary effort is normal.  Abdominal:     Palpations: Abdomen is soft.  Skin:    General: Skin is warm.  Neurological:     Mental Status: He is alert and oriented to person, place, and time.  Psychiatric:        Behavior: Behavior normal.        Thought Content: Thought content normal.        Judgment: Judgment normal.     Ortho Exam Examination of the left shoulder shows pain and weakness with testing of the supraspinatus and O'Brien's test.  Pain with impingement and pain with cross body adduction test.  Pain with with tenderness to the [facility] joint. Specialty Comments:  No specialty comments available.  Imaging: No results found.   PMFS History: Patient Active Problem List   Diagnosis Date Noted   Gastroesophageal reflux disease 11/22/2022   Type 2 diabetes mellitus with hyperglycemia, without long-term current use of insulin (HCC) 06/01/2022   Major depressive disorder with single episode, in partial remission (HCC) 01/11/2021   Chest pain 10/07/2016   Stable angina 10/07/2016   Angina decubitus 09/30/2016   Family  history of early CAD 09/30/2016   Pure hypercholesterolemia 09/30/2016   Essential hypertension 07/13/2016   Depression 07/13/2016   Past Medical History:  Diagnosis Date   Anginal pain (HCC)    Coronary artery disease    Depression    Essential hypertension 07/13/2016   Family history of adverse reaction to anesthesia    " My Dad had bad reactions ,He would wake up & have hallucinations"   GERD (gastroesophageal reflux disease)    History of chicken pox    History of colon polyps    Hyperlipidemia     Family History  Problem Relation  Age of Onset   Heart disease Father    Prostate cancer Father    Alzheimer's disease Father    Diabetes Father    Hypertension Father    Diabetes Maternal Grandmother    Alzheimer's disease Paternal Grandmother    Breast cancer Neg Hx     Past Surgical History:  Procedure Laterality Date   CARDIAC CATHETERIZATION  10/07/2016   CARDIAC CATHETERIZATION N/A 10/07/2016   Procedure: Left Heart Cath and Coronary Angiography;  Surgeon: Yvonne Kendall, MD;  Location: [facility] INVASIVE CV LAB;  Service: Cardiovascular;  Laterality: N/A;   CARDIAC CATHETERIZATION N/A 10/07/2016   Procedure: Coronary Stent Intervention;  Surgeon: Yvonne Kendall, MD;  Location: [facility] INVASIVE CV LAB;  Service: Cardiovascular;  Laterality: N/A;  RCA   PERCUTANEOUS CORONARY STENT INTERVENTION (PCI-S)  10/07/2016   RCA   REFRACTIVE SURGERY Bilateral    ROTATOR CUFF REPAIR Right 2015   WISDOM TOOTH EXTRACTION     Social History   Occupational History   Not on file  Tobacco Use   Smoking status: Never   Smokeless tobacco: Never  Vaping Use   Vaping status: Never Used  Substance and Sexual Activity   Alcohol use: Yes    Comment: social   Drug use: No   Sexual activity: Not on file

## 2023-05-01 ENCOUNTER — Telehealth: Payer: Self-pay | Admitting: *Deleted

## 2023-05-01 NOTE — Telephone Encounter (Signed)
   Pre-operative Risk Assessment    Patient Name: Jordan Hendricks  DOB: 24-Oct-1960 MRN: [id number]      Request for Surgical Clearance    Procedure:   Left Shoulder arthroscopy, rotator cuff repair,distal clavicle excision  Date of Surgery:  Clearance TBD                                 Surgeon:  Dr. Cheral Almas Surgeon's Group or Practice Name:  [facility] Phone number:  [phone] Fax number:  [phone]   Type of Clearance Requested:   - Medical    Type of Anesthesia:  General    Additional requests/questions:    Signed, Emmit Pomfret   05/01/2023, 1:43 PM

## 2023-05-01 NOTE — Telephone Encounter (Signed)
   Name: Jordan Hendricks  DOB: November 02, 1960  MRN: [id number]  Primary Cardiologist: Donato Schultz, MD  Chart reviewed as part of pre-operative protocol coverage. Because of Lena Kimmel's past medical history and time since last visit, he will require a follow-up telephone visit in order to better assess preoperative cardiovascular risk.  Pre-op covering staff: - Please schedule appointment and call patient to inform them. If patient already had an upcoming appointment within acceptable timeframe, please add "pre-op clearance" to the appointment notes so provider is aware. - Please contact requesting surgeon's office via preferred method (i.e, phone, fax) to inform them of need for appointment prior to surgery.  No medications indicated as needing held.  Sharlene Dory, PA-C  05/01/2023, 4:45 PM

## 2023-05-02 ENCOUNTER — Telehealth: Payer: Self-pay | Admitting: *Deleted

## 2023-05-02 NOTE — Telephone Encounter (Signed)
  Patient Consent for Virtual Visit        Jordan Hendricks has provided verbal consent on 05/02/2023 for a virtual visit (video or telephone).   CONSENT FOR VIRTUAL VISIT FOR:  Jordan Hendricks  By participating in this virtual visit I agree to the following:  I hereby voluntarily request, consent and authorize [facility] and its employed or contracted physicians, physician assistants, nurse practitioners or other licensed health care professionals (the Practitioner), to provide me with telemedicine health care services (the "Services") as deemed necessary by the treating Practitioner. I acknowledge and consent to receive the Services by the Practitioner via telemedicine. I understand that the telemedicine visit will involve communicating with the Practitioner through live audiovisual communication technology and the disclosure of certain medical information by electronic transmission. I acknowledge that I have been given the opportunity to request an in-person assessment or other available alternative prior to the telemedicine visit and am voluntarily participating in the telemedicine visit.  I understand that I have the right to withhold or withdraw my consent to the use of telemedicine in the course of my care at any time, without affecting my right to future care or treatment, and that the Practitioner or I may terminate the telemedicine visit at any time. I understand that I have the right to inspect all information obtained and/or recorded in the course of the telemedicine visit and may receive copies of available information for a reasonable fee.  I understand that some of the potential risks of receiving the Services via telemedicine include:  Delay or interruption in medical evaluation due to technological equipment failure or disruption; Information transmitted may not be sufficient (e.g. poor resolution of images) to allow for appropriate medical decision making by the  Practitioner; and/or  In rare instances, security protocols could fail, causing a breach of personal health information.  Furthermore, I acknowledge that it is my responsibility to provide information about my medical history, conditions and care that is complete and accurate to the best of my ability. I acknowledge that Practitioner's advice, recommendations, and/or decision may be based on factors not within their control, such as incomplete or inaccurate data provided by me or distortions of diagnostic images or specimens that may result from electronic transmissions. I understand that the practice of medicine is not an exact science and that Practitioner makes no warranties or guarantees regarding treatment outcomes. I acknowledge that a copy of this consent can be made available to me via my patient portal [facility] MyChart), or I can request a printed copy by calling the office of  [facility].    I understand that my insurance will be billed for this visit.   I have read or had this consent read to me. I understand the contents of this consent, which adequately explains the benefits and risks of the Services being provided via telemedicine.  I have been provided ample opportunity to ask questions regarding this consent and the Services and have had my questions answered to my satisfaction. I give my informed consent for the services to be provided through the use of telemedicine in my medical care

## 2023-05-02 NOTE — Telephone Encounter (Signed)
Spoke with patient and scheduled him for a preop telehealth clearance appt on 05/23/23 at 2:40 PM. Will route to requesting surgeons office to make them aware.

## 2023-05-04 ENCOUNTER — Encounter (INDEPENDENT_AMBULATORY_CARE_PROVIDER_SITE_OTHER): Payer: Self-pay

## 2023-05-19 ENCOUNTER — Other Ambulatory Visit: Payer: Self-pay | Admitting: Nurse Practitioner

## 2023-05-23 ENCOUNTER — Ambulatory Visit: Payer: 59 | Attending: Cardiology | Admitting: Nurse Practitioner

## 2023-05-23 DIAGNOSIS — Z01810 Encounter for preprocedural cardiovascular examination: Secondary | ICD-10-CM | POA: Diagnosis not present

## 2023-05-23 NOTE — Progress Notes (Signed)
Virtual Visit via Telephone Note   Because of Jordan Hendricks's co-morbid illnesses, he is at least at moderate risk for complications without adequate follow up.  This format is felt to be most appropriate for this patient at this time.  The patient did not have access to video technology/had technical difficulties with video requiring transitioning to audio format only (telephone).  All issues noted in this document were discussed and addressed.  No physical exam could be performed with this format.  Please refer to the patient's chart for his consent to telehealth for [facility].  Evaluation Performed:  Preoperative cardiovascular risk assessment _____________   Date:  05/23/2023   Patient ID:  Jordan Hendricks, DOB 03/10/1961, MRN [id number] Patient Location:  Home Provider location:   Office  Primary Care Provider:  Sharlene Dory, DO Primary Cardiologist:  Donato Schultz, MD  Chief Complaint / Patient Profile   62 y.o. y/o male with a h/o CAD s/p DES-RCA in 2018, prior syncope, hypertension, hyperlipidemia GERD, and depression who is pending Left Shoulder arthroscopy, rotator cuff repair,distal clavicle excision with Dr. Cheral Almas of [place] [facility] and presents today for telephonic preoperative cardiovascular risk assessment.  History of Present Illness    Jordan Hendricks is a 62 y.o. male who presents via audio/video conferencing for a telehealth visit today.  Pt was last seen in cardiology clinic on 10/14/2022 by Dr. Anne Fu.  At that time Jordan Hendricks was doing well.  The patient is now pending procedure as outlined above. Since his last visit, he from a cardiac standpoint.  He had a syncopal episode in May 2024 after donating blood.  He denies chest pain, palpitations, dyspnea, pnd, orthopnea, n, v, dizziness, further syncope, edema, weight gain, or early satiety. All other systems reviewed and are otherwise negative except as noted  above.   Past Medical History    Past Medical History:  Diagnosis Date   Anginal pain (HCC)    Coronary artery disease    Depression    Essential hypertension 07/13/2016   Family history of adverse reaction to anesthesia    " My Dad had bad reactions ,He would wake up & have hallucinations"   GERD (gastroesophageal reflux disease)    History of chicken pox    History of colon polyps    Hyperlipidemia    Past Surgical History:  Procedure Laterality Date   CARDIAC CATHETERIZATION  10/07/2016   CARDIAC CATHETERIZATION N/A 10/07/2016   Procedure: Left Heart Cath and Coronary Angiography;  Surgeon: Yvonne Kendall, MD;  Location[facility] INVASIVE CV LAB;  Service: Cardiovascular;  Laterality: N/A;   CARDIAC CATHETERIZATION N/A 10/07/2016   Procedure: Coronary Stent Intervention;  Surgeon: Yvonne Kendall, MD;  Location: [facility] INVASIVE CV LAB;  Service: Cardiovascular;  Laterality: N/A;  RCA   PERCUTANEOUS CORONARY STENT INTERVENTION (PCI-S)  10/07/2016   RCA   REFRACTIVE SURGERY Bilateral    ROTATOR CUFF REPAIR Right 2015   WISDOM TOOTH EXTRACTION      Allergies  Allergies  Allergen Reactions   Lactose Intolerance (Gi)    Tape Rash    Plastic tape    Home Medications    Prior to Admission medications   Medication Sig Start Date End Date Taking? Authorizing Provider  aspirin EC 81 MG tablet Take 1 tablet (81 mg total) by mouth daily. 09/30/16   Jake Bathe, MD  busPIRone (BUSPAR) 30 MG tablet Take 30 mg by mouth 2 (two) times daily. 07/30/19   [provider]  fenofibrate (TRICOR) 48 MG tablet Take 1 tablet (48 mg total) by mouth daily. 01/02/23   Sharlene Dory, DO  glucosamine-chondroitin 500-400 MG tablet Take 1 tablet by mouth 2 (two) times daily.    [provider]  lisinopril (ZESTRIL) 10 MG tablet Take 1 tablet (10 mg total) by mouth daily. 05/23/23   Jake Bathe, MD  Melatonin 5 MG CHEW Chew 1 capsule by mouth at bedtime.    [provider]  meloxicam (MOBIC) 15 MG tablet Take 1 tablet (15 mg total) by mouth daily. 02/28/23   Sharlene Dory, DO  metoprolol succinate (TOPROL-XL) 50 MG 24 hr tablet Take 1 tablet (50 mg total) by mouth daily. Take with or immediately following a meal. 12/13/22   Jake Bathe, MD  Multiple Vitamins-Minerals (MULTIVITAMIN ADULT PO) Take 1 tablet by mouth daily.    [provider]  nitroGLYCERIN (NITROSTAT) 0.4 MG SL tablet Place 1 tablet (0.4 mg total) under the tongue every 5 (five) minutes as needed for chest pain. 10/14/22   Jake Bathe, MD  nortriptyline (PAMELOR) 25 MG capsule Take 1 capsule by mouth at bedtime. 12/19/20   [provider]  nortriptyline (PAMELOR) 50 MG capsule Take 2 capsules by mouth at bedtime. 12/22/20   [provider]  pantoprazole (PROTONIX) 40 MG tablet TAKE 1 TABLET BY MOUTH DAILY 11/23/22   Sharlene Dory, DO  rosuvastatin (CRESTOR) 40 MG tablet TAKE 1 TABLET BY MOUTH DAILY 07/26/22   Wendling, Jilda Roche, DO  sildenafil (VIAGRA) 100 MG tablet Take 0.5-1 tablets (50-100 mg total) by mouth daily as needed for erectile dysfunction. 08/23/22   Sharlene Dory, DO  traZODone (DESYREL) 100 MG tablet Take 100 mg by mouth at bedtime. 12/22/20   [provider]  VITAMIN D, CHOLECALCIFEROL, PO Take by mouth daily at 6 (six) AM.    [provider]    Physical Exam    Vital Signs:  Jordan Hendricks does not have vital signs available for review today.  Given telephonic nature of communication, physical exam is limited. AAOx3. NAD. Normal affect.  Speech and respirations are unlabored.  Accessory Clinical Findings    None  Assessment & Plan    1.  Preoperative Cardiovascular Risk Assessment:  According to the Revised Cardiac Risk Index (RCRI), his Perioperative Risk of Major Cardiac Event is (%): 0.9. His Functional Capacity in METs is: 8.97 according to the Duke Activity Status Index (DASI).Therefore,  based on ACC/AHA guidelines, patient would be at acceptable risk for the planned procedure without further cardiovascular testing.   The patient was advised that if he develops new symptoms prior to surgery to contact our office to arrange for a follow-up visit, and he verbalized understanding.  Regarding ASA therapy, we recommend continuation of ASA throughout the perioperative period.  However, if the surgeon feels that cessation of ASA is required in the perioperative period, it may be stopped 5-7 days prior to surgery with a plan to resume it as soon as felt to be feasible from a surgical standpoint in the post-operative period.  A copy of this note will be routed to requesting surgeon.  Time:   Today, I have spent 7 minutes with the patient with telehealth technology discussing medical history, symptoms, and management plan.     Joylene Grapes, NP  05/23/2023, 2:51 PM

## 2023-05-31 ENCOUNTER — Other Ambulatory Visit: Payer: Self-pay | Admitting: Family Medicine

## 2023-05-31 ENCOUNTER — Ambulatory Visit (INDEPENDENT_AMBULATORY_CARE_PROVIDER_SITE_OTHER): Payer: 59 | Admitting: Family Medicine

## 2023-05-31 ENCOUNTER — Encounter: Payer: Self-pay | Admitting: Family Medicine

## 2023-05-31 VITALS — BP 120/80 | HR 95 | Temp 98.8°F | Ht 67.0 in | Wt 186.0 lb

## 2023-05-31 DIAGNOSIS — Z7984 Long term (current) use of oral hypoglycemic drugs: Secondary | ICD-10-CM

## 2023-05-31 DIAGNOSIS — E1165 Type 2 diabetes mellitus with hyperglycemia: Secondary | ICD-10-CM

## 2023-05-31 DIAGNOSIS — Z125 Encounter for screening for malignant neoplasm of prostate: Secondary | ICD-10-CM | POA: Diagnosis not present

## 2023-05-31 DIAGNOSIS — Z1211 Encounter for screening for malignant neoplasm of colon: Secondary | ICD-10-CM

## 2023-05-31 DIAGNOSIS — Z0000 Encounter for general adult medical examination without abnormal findings: Secondary | ICD-10-CM | POA: Diagnosis not present

## 2023-05-31 LAB — CBC
HCT: 46.9 % (ref 39.0–52.0)
Hemoglobin: 15.7 g/dL (ref 13.0–17.0)
MCHC: 33.4 g/dL (ref 30.0–36.0)
MCV: 84.8 fl (ref 78.0–100.0)
Platelets: 266 10*3/uL (ref 150.0–400.0)
RBC: 5.53 Mil/uL (ref 4.22–5.81)
RDW: 12.9 % (ref 11.5–15.5)
WBC: 6.1 10*3/uL (ref 4.0–10.5)

## 2023-05-31 LAB — COMPREHENSIVE METABOLIC PANEL
ALT: 57 U/L — ABNORMAL HIGH (ref 0–53)
AST: 41 U/L — ABNORMAL HIGH (ref 0–37)
Albumin: 4.5 g/dL (ref 3.5–5.2)
Alkaline Phosphatase: 69 U/L (ref 39–117)
BUN: 12 mg/dL (ref 6–23)
CO2: 29 meq/L (ref 19–32)
Calcium: 9.7 mg/dL (ref 8.4–10.5)
Chloride: 102 meq/L (ref 96–112)
Creatinine, Ser: 1.24 mg/dL (ref 0.40–1.50)
GFR: 63.35 mL/min (ref >60.00)
Glucose, Bld: 124 mg/dL — ABNORMAL HIGH (ref 70–99)
Potassium: 4.2 meq/L (ref 3.5–5.1)
Sodium: 139 meq/L (ref 135–145)
Total Bilirubin: 0.6 mg/dL (ref 0.2–1.2)
Total Protein: 6.7 g/dL (ref 6.0–8.3)

## 2023-05-31 LAB — LIPID PANEL
Cholesterol: 116 mg/dL (ref 0–200)
HDL: 38.6 mg/dL — ABNORMAL LOW (ref >39.00)
LDL Cholesterol: 41 mg/dL (ref 0–99)
NonHDL: 76.95
Total CHOL/HDL Ratio: 3
Triglycerides: 179 mg/dL — ABNORMAL HIGH (ref 0.0–149.0)
VLDL: 35.8 mg/dL (ref 0.0–40.0)

## 2023-05-31 LAB — HEMOGLOBIN A1C: Hgb A1c MFr Bld: 7.2 % — ABNORMAL HIGH (ref 4.6–6.5)

## 2023-05-31 LAB — PSA: PSA: 1.66 ng/mL (ref 0.10–4.00)

## 2023-05-31 MED ORDER — METFORMIN HCL ER 500 MG PO TB24: 1000.0000 mg | ORAL_TABLET | Freq: Every day | ORAL | 1 refills | Status: DC

## 2023-05-31 NOTE — Progress Notes (Signed)
Chief Complaint  Patient presents with   Annual Exam   Knee Pain    right    Well Male Jordan Hendricks is here for a complete physical.   His last physical was >1 year ago.  Current diet: in general, a "healthy" diet.  Current exercise: walking, lifting legs Weight trend: back down a few lbs Fatigue out of ordinary? No. Seat belt? Yes.   Advanced directive? Yes  Health maintenance Shingrix- Yes CCS- Due Tetanus- Yes HIV- Yes Hep C- Yes   Past Medical History:  Diagnosis Date   Anginal pain (HCC)    Coronary artery disease    Depression    Essential hypertension 07/13/2016   Family history of adverse reaction to anesthesia    " My Dad had bad reactions ,He would wake up & have hallucinations"   GERD (gastroesophageal reflux disease)    History of chicken pox    History of colon polyps    Hyperlipidemia       Past Surgical History:  Procedure Laterality Date   CARDIAC CATHETERIZATION  10/07/2016   CARDIAC CATHETERIZATION N/A 10/07/2016   Procedure: Left Heart Cath and Coronary Angiography;  Surgeon: Yvonne Kendall, MD;  Location: [facility] INVASIVE CV LAB;  Service: Cardiovascular;  Laterality: N/A;   CARDIAC CATHETERIZATION N/A 10/07/2016   Procedure: Coronary Stent Intervention;  Surgeon: Yvonne Kendall, MD;  Location: [facility] INVASIVE CV LAB;  Service: Cardiovascular;  Laterality: N/A;  RCA   PERCUTANEOUS CORONARY STENT INTERVENTION (PCI-S)  10/07/2016   RCA   REFRACTIVE SURGERY Bilateral    ROTATOR CUFF REPAIR Right 2015   WISDOM TOOTH EXTRACTION      Medications  Current Outpatient Medications on File Prior to Visit  Medication Sig Dispense Refill   aspirin EC 81 MG tablet Take 1 tablet (81 mg total) by mouth daily. 30 tablet 11   busPIRone (BUSPAR) 30 MG tablet Take 30 mg by mouth 2 (two) times daily.     fenofibrate (TRICOR) 48 MG tablet Take 1 tablet (48 mg total) by mouth daily. 90 tablet 3   glucosamine-chondroitin 500-400 MG tablet Take 1 tablet by mouth 2  (two) times daily.     lisinopril (ZESTRIL) 10 MG tablet Take 1 tablet (10 mg total) by mouth daily. 90 tablet 0   Melatonin 5 MG CHEW Chew 1 capsule by mouth at bedtime.     meloxicam (MOBIC) 15 MG tablet Take 1 tablet (15 mg total) by mouth daily. 30 tablet 0   metoprolol succinate (TOPROL-XL) 50 MG 24 hr tablet Take 1 tablet (50 mg total) by mouth daily. Take with or immediately following a meal. 90 tablet 3   Multiple Vitamins-Minerals (MULTIVITAMIN ADULT PO) Take 1 tablet by mouth daily.     nitroGLYCERIN (NITROSTAT) 0.4 MG SL tablet Place 1 tablet (0.4 mg total) under the tongue every 5 (five) minutes as needed for chest pain. 25 tablet 3   nortriptyline (PAMELOR) 25 MG capsule Take 1 capsule by mouth at bedtime.     nortriptyline (PAMELOR) 50 MG capsule Take 2 capsules by mouth at bedtime.     pantoprazole (PROTONIX) 40 MG tablet TAKE 1 TABLET BY MOUTH DAILY 90 tablet 3   rosuvastatin (CRESTOR) 40 MG tablet TAKE 1 TABLET BY MOUTH DAILY 90 tablet 3   sildenafil (VIAGRA) 100 MG tablet Take 0.5-1 tablets (50-100 mg total) by mouth daily as needed for erectile dysfunction. 30 tablet 2   traZODone (DESYREL) 100 MG tablet Take 100 mg by mouth at bedtime.  VITAMIN D, CHOLECALCIFEROL, PO Take by mouth daily at 6 (six) AM.      Allergies Allergies  Allergen Reactions   Lactose Intolerance (Gi)    Tape Rash    Plastic tape    Family History Family History  Problem Relation Age of Onset   Heart disease Father    Prostate cancer Father    Alzheimer's disease Father    Diabetes Father    Hypertension Father    Diabetes Maternal Grandmother    Alzheimer's disease Paternal Grandmother    Breast cancer Neg Hx     Review of Systems: Constitutional:  no fevers Eye:  no recent significant change in vision Ear/Nose/Mouth/Throat:  Ears:  no hearing loss Nose/Mouth/Throat:  no complaints of nasal congestion, no sore throat Cardiovascular:  no chest pain Respiratory:  no shortness of  breath Gastrointestinal:  no change in bowel habits GU:  Male: negative for dysuria, frequency Musculoskeletal/Extremities: +R knee pain Integumentary (Skin/Breast):  no abnormal skin lesions reported Neurologic:  no headaches Endocrine: No unexpected weight changes Hematologic/Lymphatic:  no abnormal bleeding  Exam BP 120/80 (BP Location: Left Arm, Patient Position: Sitting, Cuff Size: Normal)   Pulse 95   Temp 98.8 F (37.1 C) (Oral)   Ht 5\' 7"  (1.702 m)   Wt 186 lb (84.4 kg)   SpO2 98%   BMI 29.13 kg/m  General:  well developed, well nourished, in no apparent distress Skin:  no significant moles, warts, or growths Head:  no masses, lesions, or tenderness Eyes:  pupils equal and round, sclera anicteric without injection Ears:  canals without lesions, TMs shiny without retraction, no obvious effusion, no erythema Nose:  nares patent, mucosa normal Throat/Pharynx:  lips and gingiva without lesion; tongue and uvula midline; non-inflamed pharynx; no exudates or postnasal drainage Neck: neck supple without adenopathy, thyromegaly, or masses Cardiac: RRR, no bruits, no LE edema Lungs:  clear to auscultation, breath sounds equal bilaterally, no respiratory distress Abdomen: BS+, soft, non-tender, non-distended, no masses or organomegaly noted Rectal: Deferred Musculoskeletal: no effusion of R knee, TTP, deformity; neg varus/valgus, Lachman's, patellar app/grind; symmetrical muscle groups noted without atrophy or deformity Neuro:  gait normal; deep tendon reflexes normal and symmetric Psych: well oriented with normal range of affect and appropriate judgment/insight  Assessment and Plan  Well adult exam - Plan: CBC, Comprehensive metabolic panel, Lipid panel  Type 2 diabetes mellitus with hyperglycemia, without long-term current use of insulin (HCC) - Plan: Hemoglobin A1c  Screen for colon cancer  Screening for prostate cancer - Plan: PSA   Well 62 y.o. male. Counseled on diet  and exercise. Counseled on risks and benefits of prostate cancer screening with PSA. The patient agrees to undergo testing. CCS- reorder Cologard. Stretches for knee given, sports med referral if no better in a mo.  Immunizations, labs, and further orders as above. Follow up in 6 mo. The patient voiced understanding and agreement to the plan.  Jilda Roche [place], DO 05/31/23 7:58 AM

## 2023-05-31 NOTE — Patient Instructions (Addendum)
Give [place] 2-3 business days to get the results of your labs back.   Keep the diet clean and stay active.  I recommend getting the flu shot in mid October. This suggestion would change if the CDC comes out with a different recommendation.   You should receive a Cologard kit in the mail in the coming weeks. If not, please let me know.   Send me a message in a month if the knee is no better.   Let us know if you need anything.  Knee Exercises It is normal to feel mild stretching, pulling, tightness, or discomfort as you do these exercises, but you should stop right away if you feel sudden pain or your pain gets worse.  STRETCHING AND RANGE OF MOTION EXERCISES  These exercises warm up your muscles and joints and improve the movement and flexibility of your knee. These exercises also help to relieve pain, numbness, and tingling. Exercise A: Knee Extension, Prone  Lie on your abdomen on a bed. Place your left / right knee just beyond the edge of the surface so your knee is not on the bed. You can put a towel under your left / right thigh just above your knee for comfort. Relax your leg muscles and allow gravity to straighten your knee. You should feel a stretch behind your left / right knee. Hold this position for 30 seconds. Scoot up so your knee is supported between repetitions. Repeat 2 times. Complete this stretch 3 times per week. Exercise B: Knee Flexion, Active     Lie on your back with both knees straight. If this causes back discomfort, bend your left / right knee so your foot is flat on the floor. Slowly slide your left / right heel back toward your buttocks until you feel a gentle stretch in the front of your knee or thigh. Hold this position for 30 seconds. Slowly slide your left / right heel back to the starting position. Repeat 2 times. Complete this exercise 3 times per week. Exercise C: Quadriceps, Prone     Lie on your abdomen on a firm surface, such as a bed or padded  floor. Bend your left / right knee and hold your ankle. If you cannot reach your ankle or pant leg, loop a belt around your foot and grab the belt instead. Gently pull your heel toward your buttocks. Your knee should not slide out to the side. You should feel a stretch in the front of your thigh and knee. Hold this position for 30 seconds. Repeat 2 times. Complete this stretch 3 times per week. Exercise D: Hamstring, Supine  Lie on your back. Loop a belt or towel over the ball of your left / right foot. The ball of your foot is on the walking surface, right under your toes. Straighten your left / right knee and slowly pull on the belt to raise your leg until you feel a gentle stretch behind your knee. Do not let your left / right knee bend while you do this. Keep your other leg flat on the floor. Hold this position for 30 seconds. Repeat 2 times. Complete this stretch 3 times per week. STRENGTHENING EXERCISES  These exercises build strength and endurance in your knee. Endurance is the ability to use your muscles for a long time, even after they get tired. Exercise E: Quadriceps, Isometric     Lie on your back with your left / right leg extended and your other knee bent. Put a  rolled towel or small pillow under your knee if told by your health care provider. Slowly tense the muscles in the front of your left / right thigh. You should see your kneecap slide up toward your hip or see increased dimpling just above the knee. This motion will push the back of the knee toward the floor. For 3 seconds, keep the muscle as tight as you can without increasing your pain. Relax the muscles slowly and completely. Repeat for 10 total reps Repeat 2 ti mes. Complete this exercise 3 times per week. Exercise F: Straight Leg Raises - Quadriceps  Lie on your back with your left / right leg extended and your other knee bent. Tense the muscles in the front of your left / right thigh. You should see your kneecap  slide up or see increased dimpling just above the knee. Your thigh may even shake a bit. Keep these muscles tight as you raise your leg 4-6 inches (10-15 cm) off the floor. Do not let your knee bend. Hold this position for 3 seconds. Keep these muscles tense as you lower your leg. Relax your muscles slowly and completely after each repetition. 10 total reps. Repeat 2 times. Complete this exercise 3 times per week.  Exercise G: Hamstring Curls     If told by your health care provider, do this exercise while wearing ankle weights. Begin with 5 lb weights (optional). Then increase the weight by 1 lb (0.5 kg) increments. Do not wear ankle weights that are more than 20 lbs to start with. Lie on your abdomen with your legs straight. Bend your left / right knee as far as you can without feeling pain. Keep your hips flat against the floor. Hold this position for 3 seconds. Slowly lower your leg to the starting position. Repeat for 10 reps.  Repeat 2 times. Complete this exercise 3 times per week. Exercise H: Squats (Quadriceps)  Stand in front of a table, with your feet and knees pointing straight ahead. You may rest your hands on the table for balance but not for support. Slowly bend your knees and lower your hips like you are going to sit in a chair. Keep your weight over your heels, not over your toes. Keep your lower legs upright so they are parallel with the table legs. Do not let your hips go lower than your knees. Do not bend lower than told by your health care provider. If your knee pain increases, do not bend as low. Hold the squat position for 1 second. Slowly push with your legs to return to standing. Do not use your hands to pull yourself to standing. Repeat 2 times. Complete this exercise 3 times per week. Exercise I: Wall Slides (Quadriceps)     Lean your back against a smooth wall or door while you walk your feet out 18-24 inches (46-61 cm) from it. Place your feet hip-width  apart. Slowly slide down the wall or door until your knees Repeat 2 times. Complete this exercise every other day. Exercise K: Straight Leg Raises - Hip Abductors  Lie on your side with your left / right leg in the top position. Lie so your head, shoulder, knee, and hip line up. You may bend your bottom knee to help you keep your balance. Roll your hips slightly forward so your hips are stacked directly over each other and your left / right knee is facing forward. Leading with your heel, lift your top leg 4-6 inches (10-15 cm). You  should feel the muscles in your outer hip lifting. Do not let your foot drift forward. Do not let your knee roll toward the ceiling. Hold this position for 3 seconds. Slowly return your leg to the starting position. Let your muscles relax completely after each repetition. 10 total reps. Repeat 2 times. Complete this exercise 3 times per week. Exercise J: Straight Leg Raises - Hip Extensors  Lie on your abdomen on a firm surface. You can put a pillow under your hips if that is more comfortable. Tense the muscles in your buttocks and lift your left / right leg about 4-6 inches (10-15 cm). Keep your knee straight as you lift your leg. Hold this position for 3 seconds. Slowly lower your leg to the starting position. Let your leg relax completely after each repetition. Repeat 2 times. Complete this exercise 3 times per week. Document Released: 07/20/2005 Document Revised: 05/30/2016 Document Reviewed: 07/12/2015 Elsevier Interactive Patient Education  2017 [employer].

## 2023-06-20 ENCOUNTER — Other Ambulatory Visit: Payer: Self-pay | Admitting: Family Medicine

## 2023-06-20 ENCOUNTER — Encounter: Payer: Self-pay | Admitting: Family Medicine

## 2023-06-20 DIAGNOSIS — Z1211 Encounter for screening for malignant neoplasm of colon: Secondary | ICD-10-CM

## 2023-06-22 ENCOUNTER — Encounter: Payer: Self-pay | Admitting: Orthopaedic Surgery

## 2023-06-25 ENCOUNTER — Other Ambulatory Visit: Payer: Self-pay | Admitting: Family Medicine

## 2023-06-26 ENCOUNTER — Other Ambulatory Visit: Payer: Self-pay | Admitting: Physician Assistant

## 2023-06-26 MED ORDER — OXYCODONE-ACETAMINOPHEN 5-325 MG PO TABS
1.0000 | ORAL_TABLET | Freq: Four times a day (QID) | ORAL | 0 refills | Status: DC | PRN
Start: 2023-06-26 — End: 2023-07-11

## 2023-06-29 DIAGNOSIS — S43432A Superior glenoid labrum lesion of left shoulder, initial encounter: Secondary | ICD-10-CM | POA: Diagnosis not present

## 2023-06-29 DIAGNOSIS — M7542 Impingement syndrome of left shoulder: Secondary | ICD-10-CM | POA: Diagnosis not present

## 2023-06-29 DIAGNOSIS — S46012A Strain of muscle(s) and tendon(s) of the rotator cuff of left shoulder, initial encounter: Secondary | ICD-10-CM | POA: Diagnosis not present

## 2023-06-29 DIAGNOSIS — M19012 Primary osteoarthritis, left shoulder: Secondary | ICD-10-CM | POA: Diagnosis not present

## 2023-06-30 ENCOUNTER — Encounter: Payer: Self-pay | Admitting: Orthopaedic Surgery

## 2023-07-03 LAB — COLOGUARD: COLOGUARD: NEGATIVE

## 2023-07-06 ENCOUNTER — Encounter: Payer: 59 | Admitting: Orthopaedic Surgery

## 2023-07-07 ENCOUNTER — Encounter: Payer: 59 | Admitting: Orthopaedic Surgery

## 2023-07-11 ENCOUNTER — Encounter: Payer: Self-pay | Admitting: Orthopaedic Surgery

## 2023-07-11 ENCOUNTER — Ambulatory Visit (INDEPENDENT_AMBULATORY_CARE_PROVIDER_SITE_OTHER): Payer: 59 | Admitting: Physician Assistant

## 2023-07-11 DIAGNOSIS — Z9889 Other specified postprocedural states: Secondary | ICD-10-CM

## 2023-07-11 MED ORDER — HYDROCODONE-ACETAMINOPHEN 5-325 MG PO TABS: 1.0000 | ORAL_TABLET | Freq: Two times a day (BID) | ORAL | 0 refills | Status: DC | PRN

## 2023-07-11 NOTE — Progress Notes (Signed)
Post-Op Visit Note   Patient: Jordan Hendricks           Date of Birth: [birth year]           MRN: [id number] Visit Date: 07/11/2023 PCP: Sharlene Dory, DO   Assessment & Plan:  Chief Complaint:  Chief Complaint  Patient presents with   Left Shoulder - Follow-up    Left shoulder scope 06/29/2023   Visit Diagnoses:  1. S/P left rotator cuff repair     Plan:  patient is a pleasant 62 year old who comes in today 2 weeks status post left shoulder rotator cuff repair.  He has been doing well.  He is only taking Tylenol for pain.  He has been compliant wearing his sling.  Examination of the left shoulder reveals fully healed surgical portals with nylon sutures in place.  No complication.  Today, sutures were removed and Steri-Strips applied.  He will continue wearing his sling for another 4 weeks.  Referrals been made to outpatient physical therapy.  Follow-up with [place] in 4 weeks for recheck.  Call with concerns or questions.  Follow-Up Instructions: Return in about 4 weeks (around 08/08/2023).   Orders:  Orders Placed This Encounter  Procedures   Ambulatory referral to Physical Therapy   Meds ordered this encounter  Medications   HYDROcodone-acetaminophen (NORCO) 5-325 MG tablet    Sig: Take 1 tablet by mouth 2 (two) times daily as needed.    Dispense:  20 tablet    Refill:  0    Imaging: No new imaging  PMFS History: Patient Active Problem List   Diagnosis Date Noted   Gastroesophageal reflux disease 11/22/2022   Type 2 diabetes mellitus with hyperglycemia, without long-term current use of insulin (HCC) 06/01/2022   Major depressive disorder with single episode, in partial remission (HCC) 01/11/2021   Chest pain 10/07/2016   Stable angina (HCC) 10/07/2016   Angina decubitus (HCC) 09/30/2016   Family history of early CAD 09/30/2016   Pure hypercholesterolemia 09/30/2016   Essential hypertension 07/13/2016   Depression 07/13/2016   Past Medical History:   Diagnosis Date   Anginal pain (HCC)    Coronary artery disease    Depression    Essential hypertension 07/13/2016   Family history of adverse reaction to anesthesia    " My Dad had bad reactions ,He would wake up & have hallucinations"   GERD (gastroesophageal reflux disease)    History of chicken pox    History of colon polyps    Hyperlipidemia     Family History  Problem Relation Age of Onset   Heart disease Father    Prostate cancer Father    Alzheimer's disease Father    Diabetes Father    Hypertension Father    Diabetes Maternal Grandmother    Alzheimer's disease Paternal Grandmother    Breast cancer Neg Hx     Past Surgical History:  Procedure Laterality Date   CARDIAC CATHETERIZATION  10/07/2016   CARDIAC CATHETERIZATION N/A 10/07/2016   Procedure: Left Heart Cath and Coronary Angiography;  Surgeon: Yvonne Kendall, MD;  Location: [facility] INVASIVE CV LAB;  Service: Cardiovascular;  Laterality: N/A;   CARDIAC CATHETERIZATION N/A 10/07/2016   Procedure: Coronary Stent Intervention;  Surgeon: Yvonne Kendall, MD;  Location: [facility] INVASIVE CV LAB;  Service: Cardiovascular;  Laterality: N/A;  RCA   PERCUTANEOUS CORONARY STENT INTERVENTION (PCI-S)  10/07/2016   RCA   REFRACTIVE SURGERY Bilateral    ROTATOR CUFF REPAIR Right 2015   WISDOM  TOOTH EXTRACTION     Social History   Occupational History   Not on file  Tobacco Use   Smoking status: Never   Smokeless tobacco: Never  Vaping Use   Vaping status: Never Used  Substance and Sexual Activity   Alcohol use: Yes    Comment: social   Drug use: No   Sexual activity: Not on file

## 2023-08-08 ENCOUNTER — Ambulatory Visit (INDEPENDENT_AMBULATORY_CARE_PROVIDER_SITE_OTHER): Payer: 59 | Admitting: Orthopaedic Surgery

## 2023-08-08 DIAGNOSIS — Z9889 Other specified postprocedural states: Secondary | ICD-10-CM

## 2023-08-08 DIAGNOSIS — S46012A Strain of muscle(s) and tendon(s) of the rotator cuff of left shoulder, initial encounter: Secondary | ICD-10-CM

## 2023-08-08 DIAGNOSIS — M19012 Primary osteoarthritis, left shoulder: Secondary | ICD-10-CM

## 2023-08-08 NOTE — Progress Notes (Signed)
Post-Op Visit Note   Patient: Jordan Hendricks           Date of Birth: 09/18/1961           MRN: [id number] Visit Date: 08/08/2023 PCP: Sharlene Dory, DO   Assessment & Plan:  Chief Complaint:  Chief Complaint  Patient presents with   Left Shoulder - Follow-up    Left shoulder scope 06/29/2023   Visit Diagnoses:  1. S/P left rotator cuff repair   2. Traumatic complete tear of left rotator cuff, initial encounter   3. Arthritis of left acromioclavicular joint     Plan: Patient is 6 weeks status post left rotator cuff repair.  He is doing well overall.  Takes Tylenol occasionally.  He is doing physical therapy in [facility] and at [facility].  Exam of the left shoulder shows fully healed surgical scars.  Range of motion is progressing.  I read his physical therapy notes and am pleased with the progress.  At this point he can wean out of the sling and begin strengthening.  May be developing a very mild frozen shoulder but I think this will resolve.  Continue physical therapy.  Recheck in 6 weeks.  Follow-Up Instructions: Return in about 6 weeks (around 09/19/2023) for with lindsey.   Orders:  No orders of the defined types were placed in this encounter.  No orders of the defined types were placed in this encounter.   Imaging: No results found.  PMFS History: Patient Active Problem List   Diagnosis Date Noted   Gastroesophageal reflux disease 11/22/2022   Type 2 diabetes mellitus with hyperglycemia, without long-term current use of insulin (HCC) 06/01/2022   Major depressive disorder with single episode, in partial remission (HCC) 01/11/2021   Chest pain 10/07/2016   Stable angina (HCC) 10/07/2016   Angina decubitus (HCC) 09/30/2016   Family history of early CAD 09/30/2016   Pure hypercholesterolemia 09/30/2016   Essential hypertension 07/13/2016   Depression 07/13/2016   Past Medical History:  Diagnosis Date   Anginal pain (HCC)    Coronary  artery disease    Depression    Essential hypertension 07/13/2016   Family history of adverse reaction to anesthesia    " My Dad had bad reactions ,He would wake up & have hallucinations"   GERD (gastroesophageal reflux disease)    History of chicken pox    History of colon polyps    Hyperlipidemia     Family History  Problem Relation Age of Onset   Heart disease Father    Prostate cancer Father    Alzheimer's disease Father    Diabetes Father    Hypertension Father    Diabetes Maternal Grandmother    Alzheimer's disease Paternal Grandmother    Breast cancer Neg Hx     Past Surgical History:  Procedure Laterality Date   CARDIAC CATHETERIZATION  10/07/2016   CARDIAC CATHETERIZATION N/A 10/07/2016   Procedure: Left Heart Cath and Coronary Angiography;  Surgeon: Yvonne Kendall, MD;  Location: [facility] INVASIVE CV LAB;  Service: Cardiovascular;  Laterality: N/A;   CARDIAC CATHETERIZATION N/A 10/07/2016   Procedure: Coronary Stent Intervention;  Surgeon: Yvonne Kendall, MD;  Location: [facility] INVASIVE CV LAB;  Service: Cardiovascular;  Laterality: N/A;  RCA   PERCUTANEOUS CORONARY STENT INTERVENTION (PCI-S)  10/07/2016   RCA   REFRACTIVE SURGERY Bilateral    ROTATOR CUFF REPAIR Right 2015   WISDOM TOOTH EXTRACTION     Social History   Occupational History  Not on file  Tobacco Use   Smoking status: Never   Smokeless tobacco: Never  Vaping Use   Vaping status: Never Used  Substance and Sexual Activity   Alcohol use: Yes    Comment: social   Drug use: No   Sexual activity: Not on file

## 2023-08-12 ENCOUNTER — Other Ambulatory Visit: Payer: Self-pay | Admitting: Cardiology

## 2023-08-15 LAB — HM DIABETES EYE EXAM

## 2023-08-30 ENCOUNTER — Encounter: Payer: Self-pay | Admitting: Family Medicine

## 2023-08-30 ENCOUNTER — Other Ambulatory Visit: Payer: Self-pay

## 2023-08-30 ENCOUNTER — Ambulatory Visit: Payer: 59 | Admitting: Family Medicine

## 2023-08-30 VITALS — BP 128/82 | HR 100 | Temp 98.0°F | Resp 16 | Ht 67.0 in | Wt 188.4 lb

## 2023-08-30 DIAGNOSIS — E782 Mixed hyperlipidemia: Secondary | ICD-10-CM | POA: Insufficient documentation

## 2023-08-30 DIAGNOSIS — R3989 Other symptoms and signs involving the genitourinary system: Secondary | ICD-10-CM

## 2023-08-30 DIAGNOSIS — E1165 Type 2 diabetes mellitus with hyperglycemia: Secondary | ICD-10-CM | POA: Diagnosis not present

## 2023-08-30 DIAGNOSIS — Z7984 Long term (current) use of oral hypoglycemic drugs: Secondary | ICD-10-CM

## 2023-08-30 LAB — LIPID PANEL
Cholesterol: 109 mg/dL (ref 0–200)
HDL: 35.3 mg/dL — ABNORMAL LOW (ref >39.00)
LDL Cholesterol: 36 mg/dL (ref 0–99)
NonHDL: 73.89
Total CHOL/HDL Ratio: 3
Triglycerides: 190 mg/dL — ABNORMAL HIGH (ref 0.0–149.0)
VLDL: 38 mg/dL (ref 0.0–40.0)

## 2023-08-30 LAB — COMPREHENSIVE METABOLIC PANEL
ALT: 38 U/L (ref 0–53)
AST: 25 U/L (ref 0–37)
Albumin: 4.6 g/dL (ref 3.5–5.2)
Alkaline Phosphatase: 73 U/L (ref 39–117)
BUN: 14 mg/dL (ref 6–23)
CO2: 29 meq/L (ref 19–32)
Calcium: 9.7 mg/dL (ref 8.4–10.5)
Chloride: 101 meq/L (ref 96–112)
Creatinine, Ser: 1.18 mg/dL (ref 0.40–1.50)
GFR: 67.12 mL/min (ref >60.00)
Glucose, Bld: 149 mg/dL — ABNORMAL HIGH (ref 70–99)
Potassium: 4.1 meq/L (ref 3.5–5.1)
Sodium: 140 meq/L (ref 135–145)
Total Bilirubin: 0.7 mg/dL (ref 0.2–1.2)
Total Protein: 6.6 g/dL (ref 6.0–8.3)

## 2023-08-30 LAB — URINALYSIS, MICROSCOPIC ONLY

## 2023-08-30 LAB — HEMOGLOBIN A1C: Hgb A1c MFr Bld: 7 % — ABNORMAL HIGH (ref 4.6–6.5)

## 2023-08-30 NOTE — Patient Instructions (Signed)
Give us 2-3 business days to get the results of your labs back.   Keep the diet clean and stay active.  Let us know if you need anything. 

## 2023-08-30 NOTE — Progress Notes (Signed)
Subjective:   Chief Complaint  Patient presents with   Follow-up    Follow up    Jordan Hendricks is a 62 y.o. male here for follow-up of diabetes.   Jordan Hendricks does not routinely monitor his sugars.  Patient does not require insulin.   Medications include: metformin XR 1000 mg/d Diet is OK- fewer sweets.  Exercise: PT for shoulder, left; some walking  Hyperlipidemia Patient presents for mixed hyperlipidemia follow up. Currently being treated with Tricor 48 mg/d, Crestor 40 mg/d and compliance with treatment thus far has been good. He denies myalgias. Diet/exercise as above. No CP or SOB.  The patient is known to have coexisting coronary artery disease.  4 d ago, felt ill and urinated. Saw a pinkish hue. No pain, blood or dc w urination. +urgency chronic. Urinates 1x per night.   Past Medical History:  Diagnosis Date   Anginal pain (HCC)    Coronary artery disease    Depression    Essential hypertension 07/13/2016   Family history of adverse reaction to anesthesia    " My Dad had bad reactions ,He would wake up & have hallucinations"   GERD (gastroesophageal reflux disease)    History of chicken pox    History of colon polyps    Hyperlipidemia      Related testing: Retinal exam: Done Pneumovax: done  Objective:  BP 128/82 (BP Location: Left Arm, Patient Position: Sitting, Cuff Size: Normal)   Pulse 100   Temp 98 F (36.7 C) (Oral)   Resp 16   Ht 5\' 7"  (1.702 m)   Wt 188 lb 6.4 oz (85.5 kg)   SpO2 96%   BMI 29.51 kg/m  General:  Well developed, well nourished, in no apparent distress Skin:  Warm, no pallor or diaphoresis Head:  Normocephalic, atraumatic Eyes:  Pupils equal and round, sclera anicteric without injection  Lungs:  CTAB, no access msc use Cardio:  RRR, no bruits, no LE edema Musculoskeletal:  Symmetrical muscle groups noted without atrophy or deformity Neuro:  Sensation intact to pinprick on feet Psych: Age appropriate judgment and  insight  Assessment:   Type 2 diabetes mellitus with hyperglycemia, without long-term current use of insulin (HCC) - Plan: Hemoglobin A1c  Mixed hyperlipidemia - Plan: Lipid panel, Comprehensive metabolic panel  Urine discoloration - Plan: Urine Microscopic Only, Urine Culture   Plan:   Chronic, hopefully stable. Cont metformin XR 1000 mg/d.  Would consider making this twice daily if not controlled versus adding another agent.  Counseled on diet and exercise.  Chronic, hopefully stable.  Continue Tricor 48 mg daily, Crestor 40 mg daily. Check for blood and infection.  Asymptomatic now.  If it ever happens again and workup is negative now, we will just refer to urology. F/u in 3-6 mo. The patient voiced understanding and agreement to the plan.  Jilda Roche [place], DO 08/30/23 7:52 AM

## 2023-08-31 ENCOUNTER — Other Ambulatory Visit: Payer: Self-pay

## 2023-08-31 ENCOUNTER — Emergency Department (HOSPITAL_BASED_OUTPATIENT_CLINIC_OR_DEPARTMENT_OTHER)
Admission: EM | Admit: 2023-08-31 | Discharge: 2023-08-31 | Disposition: A | Discharge status: Home / Self Care | Payer: 59 | Attending: Emergency Medicine | Admitting: Emergency Medicine

## 2023-08-31 ENCOUNTER — Emergency Department (HOSPITAL_BASED_OUTPATIENT_CLINIC_OR_DEPARTMENT_OTHER): Payer: 59

## 2023-08-31 ENCOUNTER — Encounter (HOSPITAL_BASED_OUTPATIENT_CLINIC_OR_DEPARTMENT_OTHER): Payer: Self-pay

## 2023-08-31 DIAGNOSIS — N201 Calculus of ureter: Secondary | ICD-10-CM

## 2023-08-31 DIAGNOSIS — I10 Essential (primary) hypertension: Secondary | ICD-10-CM | POA: Insufficient documentation

## 2023-08-31 DIAGNOSIS — R1031 Right lower quadrant pain: Secondary | ICD-10-CM | POA: Insufficient documentation

## 2023-08-31 DIAGNOSIS — I2510 Atherosclerotic heart disease of native coronary artery without angina pectoris: Secondary | ICD-10-CM | POA: Diagnosis not present

## 2023-08-31 DIAGNOSIS — R109 Unspecified abdominal pain: Secondary | ICD-10-CM

## 2023-08-31 LAB — URINALYSIS, ROUTINE W REFLEX MICROSCOPIC
Bilirubin Urine: NEGATIVE
Glucose, UA: 100 mg/dL — AB
Ketones, ur: NEGATIVE mg/dL
Leukocytes,Ua: NEGATIVE
Nitrite: NEGATIVE
Protein, ur: NEGATIVE mg/dL
Specific Gravity, Urine: 1.025 (ref 1.005–1.030)
pH: 5.5 (ref 5.0–8.0)

## 2023-08-31 LAB — COMPREHENSIVE METABOLIC PANEL
ALT: 35 U/L (ref 0–44)
AST: 32 U/L (ref 15–41)
Albumin: 4.1 g/dL (ref 3.5–5.0)
Alkaline Phosphatase: 65 U/L (ref 38–126)
Anion gap: 8 (ref 5–15)
BUN: 18 mg/dL (ref 6–20)
CO2: 26 mmol/L (ref 22–32)
Calcium: 9.1 mg/dL (ref 8.9–10.3)
Chloride: 103 mmol/L (ref 98–111)
Creatinine, Ser: 1.31 mg/dL — ABNORMAL HIGH (ref 0.61–1.24)
GFR, Estimated: >60 mL/min (ref >60)
Glucose, Bld: 183 mg/dL — ABNORMAL HIGH (ref 70–99)
Potassium: 4.4 mmol/L (ref 3.5–5.1)
Sodium: 137 mmol/L (ref 135–145)
Total Bilirubin: 1 mg/dL (ref <1.2)
Total Protein: 6.6 g/dL (ref 6.5–8.1)

## 2023-08-31 LAB — CBC
HCT: 42.2 % (ref 39.0–52.0)
Hemoglobin: 14.6 g/dL (ref 13.0–17.0)
MCH: 29.1 pg (ref 26.0–34.0)
MCHC: 34.6 g/dL (ref 30.0–36.0)
MCV: 84.1 fL (ref 80.0–100.0)
Platelets: 209 10*3/uL (ref 150–400)
RBC: 5.02 MIL/uL (ref 4.22–5.81)
RDW: 12 % (ref 11.5–15.5)
WBC: 11.6 10*3/uL — ABNORMAL HIGH (ref 4.0–10.5)
nRBC: 0 % (ref 0.0–0.2)

## 2023-08-31 LAB — URINALYSIS, MICROSCOPIC (REFLEX): RBC / HPF: >50 RBC/hpf (ref 0–5)

## 2023-08-31 LAB — LIPASE, BLOOD: Lipase: 32 U/L (ref 11–51)

## 2023-08-31 MED ORDER — KETOROLAC TROMETHAMINE 15 MG/ML IJ SOLN
15.0000 mg | Freq: Once | INTRAMUSCULAR | Status: AC
  Administered 2023-08-31: 15 mg via INTRAVENOUS
  Filled 2023-08-31: qty 1

## 2023-08-31 NOTE — ED Provider Notes (Signed)
[facility]-EMERGENCY DEPT [facility] Emergency Department Provider Note MRN:  [id number]  Arrival date & time: 08/31/23     Chief Complaint   Flank Pain   History of Present Illness   Jordan Hendricks is a 62 y.o. year-old male with a history of CAD presenting to the ED with chief complaint of flank pain.  Right flank and right lower quadrant abdominal pain intermittently for the past 5 days.  3 separate episodes of moderate pain.  Had some pink urine the other day.  Denies fever, no nausea vomiting or diarrhea.  No dysuria.  Review of Systems  A thorough review of systems was obtained and all systems are negative except as noted in the HPI and PMH.   Patient's Health History    Past Medical History:  Diagnosis Date   Anginal pain (HCC)    Coronary artery disease    Depression    Essential hypertension 07/13/2016   Family history of adverse reaction to anesthesia    " My Dad had bad reactions ,He would wake up & have hallucinations"   GERD (gastroesophageal reflux disease)    History of chicken pox    History of colon polyps    Hyperlipidemia     Past Surgical History:  Procedure Laterality Date   CARDIAC CATHETERIZATION  10/07/2016   CARDIAC CATHETERIZATION N/A 10/07/2016   Procedure: Left Heart Cath and Coronary Angiography;  Surgeon: Yvonne Kendall, MD;  Location: [facility] INVASIVE CV LAB;  Service: Cardiovascular;  Laterality: N/A;   CARDIAC CATHETERIZATION N/A 10/07/2016   Procedure: Coronary Stent Intervention;  Surgeon: Yvonne Kendall, MD;  Location: [facility] INVASIVE CV LAB;  Service: Cardiovascular;  Laterality: N/A;  RCA   PERCUTANEOUS CORONARY STENT INTERVENTION (PCI-S)  10/07/2016   RCA   REFRACTIVE SURGERY Bilateral    ROTATOR CUFF REPAIR Right 2015   ROTATOR CUFF REPAIR Left 2024   WISDOM TOOTH EXTRACTION      Family History  Problem Relation Age of Onset   Heart disease Father    Prostate cancer Father    Alzheimer's disease Father    Diabetes Father     Hypertension Father    Diabetes Maternal Grandmother    Alzheimer's disease Paternal Grandmother    Breast cancer Neg Hx     Social History   Socioeconomic History   Marital status: Married    Spouse name: Not on file   Number of children: Not on file   Years of education: Not on file   Highest education level: Professional school degree (e.g., MD, DDS, DVM, JD)  Occupational History   Not on file  Tobacco Use   Smoking status: Never   Smokeless tobacco: Never  Vaping Use   Vaping status: Never Used  Substance and Sexual Activity   Alcohol use: Yes    Comment: social   Drug use: No   Sexual activity: Not on file  Other Topics Concern   Not on file  Social History Narrative   Not on file   Social Drivers of Health   Financial Resource Strain: Low Risk  (08/23/2023)   Overall Financial Resource Strain (CARDIA)    Difficulty of Paying Living Expenses: Not hard at all  Food Insecurity: No Food Insecurity (08/23/2023)   Hunger Vital Sign    Worried About Running Out of Food in the Last Year: Never true    Ran Out of Food in the Last Year: Never true  Transportation Needs: No Transportation Needs (08/23/2023)   PRAPARE - Transportation  Lack of Transportation (Medical): No    Lack of Transportation (Non-Medical): No  Physical Activity: Inactive (08/23/2023)   Exercise Vital Sign    Days of Exercise per Week: 0 days    Minutes of Exercise per Session: 60 min  Stress: No Stress Concern Present (08/23/2023)   [employer] of Occupational Health - Occupational Stress Questionnaire    Feeling of Stress : Only a little  Social Connections: Moderately Isolated (08/23/2023)   Social Connection and Isolation Panel [NHANES]    Frequency of Communication with Friends and Family: Twice a week    Frequency of Social Gatherings with Friends and Family: Once a week    Attends Religious Services: Never    Database administrator or Organizations: No    Attends Museum/gallery exhibitions officer: Not on file    Marital Status: Married  Catering manager Violence: Not on file     Physical Exam   Vitals:   08/31/23 0603  BP: (!) 146/95  Pulse: 79  Resp: 18  Temp: 97.6 F (36.4 C)  SpO2: 99%    CONSTITUTIONAL: Well-appearing, NAD NEURO/PSYCH:  Alert and oriented x 3, no focal deficits EYES:  eyes equal and reactive ENT/NECK:  no LAD, no JVD CARDIO: Regular rate, well-perfused, normal S1 and S2 PULM:  CTAB no wheezing or rhonchi GI/GU:  non-distended, non-tender MSK/SPINE:  No gross deformities, no edema SKIN:  no rash, atraumatic   *Additional and/or pertinent findings included in MDM below  Diagnostic and Interventional Summary    EKG Interpretation Date/Time:    Ventricular Rate:    PR Interval:    QRS Duration:    QT Interval:    QTC Calculation:   R Axis:      Text Interpretation:         Labs Reviewed  URINALYSIS, ROUTINE W REFLEX MICROSCOPIC - Abnormal; Notable for the following components:      Result Value   APPearance HAZY (*)    Glucose, UA 100 (*)    Hgb urine dipstick MODERATE (*)    All other components within normal limits  URINALYSIS, MICROSCOPIC (REFLEX) - Abnormal; Notable for the following components:   Bacteria, UA RARE (*)    All other components within normal limits  CBC  COMPREHENSIVE METABOLIC PANEL  LIPASE, BLOOD    CT RENAL STONE STUDY    (Results Pending)    Medications  ketorolac (TORADOL) 15 MG/ML injection 15 mg (has no administration in time range)     Procedures  /  Critical Care Procedures  ED Course and Medical Decision Making  Initial Impression and Ddx Question kidney stone versus appendicitis versus right-sided diverticulitis awaiting CT.  Past medical/surgical history that increases complexity of ED encounter: History of kidney stones, history of CAD  Interpretation of Diagnostics Labs, CT, urine pending  Patient Reassessment and Ultimate Disposition/Management     Signed  out to oncoming provider at shift change.  Patient management required discussion with the following services or consulting groups:  None  Complexity of Problems Addressed Acute illness or injury that poses threat of life of bodily function  Additional Data Reviewed and Analyzed Further history obtained from: Further history from spouse/family member  Additional Factors Impacting ED Encounter Risk Consideration of hospitalization  Elmer Sow. Pilar Plate, MD [facility] Emergency Medicine [facility] [email or web address]  Final Clinical Impressions(s) / ED Diagnoses     ICD-10-CM   1. Right lower quadrant pain  R10.31  ED Discharge Orders     None        Discharge Instructions Discussed with and Provided to Patient:   Discharge Instructions   None      Sabas Sous, MD 08/31/23 [phone]

## 2023-08-31 NOTE — Discharge Instructions (Signed)
Use the hydrocodone oxycodone that you have at home for severe pain.  Would recommend Motrin 800 mg every 8 hours.  Also would hydrate extremely well.  Make an appointment to follow-up with urology.  Return for any new or worse symptoms fevers or persistent vomiting.  CT scan confirmed a 3 mm distal right ureteral stone.  Once he gets his bladder will pass the rest of the way fine.  Work note provided.

## 2023-08-31 NOTE — ED Provider Notes (Signed)
CT confirms 3 mm distal right ureteral stone.  Based on labs no evidence of any significant complications.  Will treat symptomatically and have him follow-up with [facility].   Vanetta Mulders, MD 08/31/23 1024

## 2023-08-31 NOTE — ED Triage Notes (Signed)
Pt endorses RT flank  and RLQ pain since Saturday intermittently. Pt reports it woke him up today at 1am. Went to PCP yesterday and they noted blood in urine. Pt states it was pink tinged. Hx of kidney stones.

## 2023-09-01 ENCOUNTER — Encounter: Payer: Self-pay | Admitting: Family Medicine

## 2023-09-01 LAB — URINE CULTURE
MICRO NUMBER:: 15837430
Result:: NO GROWTH
SPECIMEN QUALITY:: ADEQUATE

## 2023-09-15 ENCOUNTER — Other Ambulatory Visit: Payer: 59

## 2023-09-19 ENCOUNTER — Encounter: Payer: Self-pay | Admitting: Physician Assistant

## 2023-09-19 ENCOUNTER — Ambulatory Visit: Payer: 59 | Admitting: Physician Assistant

## 2023-09-19 DIAGNOSIS — Z9889 Other specified postprocedural states: Secondary | ICD-10-CM

## 2023-09-19 NOTE — Progress Notes (Signed)
 Post-Op Visit Note   Patient: Jordan Hendricks           Date of Birth: Jun 16, 1961           MRN: [id number] Visit Date: 09/19/2023 PCP: Frann Mabel Mt, DO   Assessment & Plan:  Chief Complaint:  Chief Complaint  Patient presents with   Left Shoulder - Follow-up    Left shoulder scope 06/29/2023   Visit Diagnoses:  1. S/P arthroscopy of left shoulder     Plan: Patient is a pleasant 62 year old gentleman who comes in today 12 weeks status post left shoulder arthroscopic rotator cuff repair 06/29/2023.  He grabbed a paper towel last night and had increased pain to the left shoulder but overall has done well.  He takes ibuprofen on occasion if he is overdone it in physical therapy.  As far as range of motion, he has continued to make progress.  Examination of his left shoulder reveals forward flexion to approximately 160 degrees.  External rotation to approximately 70 degrees.  Internal rotation to L5.  He has approximately 4 out of 5 strength.  He is neurovascularly intact distally.  At this point, he will continue with formal physical therapy as well as a home exercise program.  Follow-up with us  in 6 weeks for recheck.  Call with concerns or questions.  Follow-Up Instructions: Return in about 6 weeks (around 10/31/2023).   Orders:  No orders of the defined types were placed in this encounter.  No orders of the defined types were placed in this encounter.   Imaging: No new imaging  PMFS History: Patient Active Problem List   Diagnosis Date Noted   Mixed hyperlipidemia 08/30/2023   Gastroesophageal reflux disease 11/22/2022   Type 2 diabetes mellitus with hyperglycemia, without long-term current use of insulin (HCC) 06/01/2022   Major depressive disorder with single episode, in partial remission (HCC) 01/11/2021   Chest pain 10/07/2016   Stable angina (HCC) 10/07/2016   Angina decubitus (HCC) 09/30/2016   Family history of early CAD 09/30/2016   Pure  hypercholesterolemia 09/30/2016   Essential hypertension 07/13/2016   Depression 07/13/2016   Past Medical History:  Diagnosis Date   Anginal pain (HCC)    Coronary artery disease    Depression    Essential hypertension 07/13/2016   Family history of adverse reaction to anesthesia     My Dad had bad reactions ,He would wake up & have hallucinations   GERD (gastroesophageal reflux disease)    History of chicken pox    History of colon polyps    Hyperlipidemia     Family History  Problem Relation Age of Onset   Heart disease Father    Prostate cancer Father    Alzheimer's disease Father    Diabetes Father    Hypertension Father    Diabetes Maternal Grandmother    Alzheimer's disease Paternal Grandmother    Breast cancer Neg Hx     Past Surgical History:  Procedure Laterality Date   CARDIAC CATHETERIZATION  10/07/2016   CARDIAC CATHETERIZATION N/A 10/07/2016   Procedure: Left Heart Cath and Coronary Angiography;  Surgeon: Lonni Hanson, MD;  Location: [facility] INVASIVE CV LAB;  Service: Cardiovascular;  Laterality: N/A;   CARDIAC CATHETERIZATION N/A 10/07/2016   Procedure: Coronary Stent Intervention;  Surgeon: Lonni Hanson, MD;  Location: [facility] INVASIVE CV LAB;  Service: Cardiovascular;  Laterality: N/A;  RCA   PERCUTANEOUS CORONARY STENT INTERVENTION (PCI-S)  10/07/2016   RCA   REFRACTIVE SURGERY Bilateral  ROTATOR CUFF REPAIR Right 2015   ROTATOR CUFF REPAIR Left 2024   WISDOM TOOTH EXTRACTION     Social History   Occupational History   Not on file  Tobacco Use   Smoking status: Never   Smokeless tobacco: Never  Vaping Use   Vaping status: Never Used  Substance and Sexual Activity   Alcohol use: Yes    Comment: social   Drug use: No   Sexual activity: Not on file

## 2023-10-01 ENCOUNTER — Other Ambulatory Visit: Payer: Self-pay | Admitting: Family Medicine

## 2023-10-16 ENCOUNTER — Ambulatory Visit: Payer: 59 | Attending: Cardiology | Admitting: Cardiology

## 2023-10-16 VITALS — BP 100/70 | HR 84 | Ht 67.0 in | Wt 191.0 lb

## 2023-10-16 DIAGNOSIS — I10 Essential (primary) hypertension: Secondary | ICD-10-CM | POA: Diagnosis not present

## 2023-10-16 DIAGNOSIS — E785 Hyperlipidemia, unspecified: Secondary | ICD-10-CM

## 2023-10-16 DIAGNOSIS — E119 Type 2 diabetes mellitus without complications: Secondary | ICD-10-CM

## 2023-10-16 DIAGNOSIS — I2510 Atherosclerotic heart disease of native coronary artery without angina pectoris: Secondary | ICD-10-CM | POA: Diagnosis not present

## 2023-10-16 NOTE — Progress Notes (Signed)
Cardiology Office Note:  .   Date:  10/16/2023  ID:  Jordan Hendricks, DOB 05-31-1961, MRN [id number] PCP: Sharlene Dory, DO  [facility] Providers Cardiologist:  Donato Schultz, MD     History of Present Illness: .   Jordan Hendricks is a 63 y.o. 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 The patient had a stent placed in the right coronary artery in 2018 and has been managing his coronary disease with daily aspirin 81mg  and Crestor 40mg . His hypertension is being treated with Lisinopril 10mg  daily, and he is also on Metoprolol succinate 50mg  daily. His hyperlipidemia is managed with Crestor 40mg  and Fenofibrate 48mg  daily.  The patient had a syncopal episode in May 2024 after donating blood, which was likely vasovagal. He also reports episodes of syncope associated with straining during bowel movements. BP at baseline is quite low today 100/70. Feels lightheaded when getting up from crouch at times. He has a strong family history of coronary disease, with his father having undergone bypass surgery at age 17.  The patient has battled depression in the past and underwent transcranial magnetic therapy. He reports feeling better after a period of significant stress and depression in 2021-2022. He has no current complaints of chest pain. He recently had a mild respiratory illness after a [place] the zoo but has no fever. His most recent labs showed a creatinine of 1.31, up from 1.18, potassium 4.4, ALT 35, LDL 36, triglycerides 190, and hemoglobin 14.6. His most recent EKG showed inferior T wave inversions.       May be retiring soon.    ROS: No CP  Studies Reviewed: Marland Kitchen   EKG Interpretation Date/Time:  Monday October 16 2023 08:24:18 EST Ventricular Rate:  89 PR Interval:  144 QRS Duration:  100 QT  Interval:  336 QTC Calculation: 408 R Axis:   -10  Text Interpretation: Normal sinus rhythm Minimal voltage criteria for LVH, may be normal variant ( Cornell product ) When compared with ECG of 08-Oct-2016 04:19, T wave inversion no longer evident in Inferior leads Confirmed by Donato Schultz ([id number]) on 10/16/2023 8:27:10 AM    Results   LABS Creatinine: 1.31 mg/dL (32/20/2542) Potassium: 4.4 mmol/L (08/31/2023) ALT: 35 U/L (08/31/2023) LDL: 36 mg/dL (70/62/3762) Triglycerides: 190 mg/dL (83/15/1761) Hemoglobin: 14.6 g/dL (60/73/7106)  DIAGNOSTIC Echocardiogram: Normal ejection fraction 65%, grade 1 diastolic dysfunction, normal right ventricle (10/24/2022) EKG: Normal (10/16/2023)     Risk Assessment/Calculations:            Physical Exam:   VS:  BP 100/70 (BP Location: Left Arm, Patient Position: Sitting, Cuff Size: Normal)   Pulse 84   Ht 5\' 7"  (1.702 m)   Wt 191 lb (86.6 kg)   SpO2 98%   BMI 29.91 kg/m    Wt Readings from Last 3 Encounters:  10/16/23 191 lb (86.6 kg)  08/31/23 188 lb (85.3 kg)  08/30/23 188 lb 6.4 oz (85.5 kg)    GEN: Well nourished, well developed in no acute distress NECK: No JVD; No carotid bruits CARDIAC: RRR, no murmurs, no rubs, no gallops RESPIRATORY:  Clear to auscultation without rales, wheezing or rhonchi  ABDOMEN: Soft, non-tender, non-distended EXTREMITIES:  No edema; No deformity   ASSESSMENT AND PLAN: .    Assessment and Plan    Coronary Artery Disease Follow-up for coronary  artery disease with a stent placed in the right coronary artery in January 2018. Currently asymptomatic with no chest pain. Recent echocardiogram showed normal ejection fraction of 65%, grade one diastolic dysfunction, and normal RV function. Previous EKG showed inferior T wave inversions, but today's EKG is normal. - Continue aspirin 81 mg daily - Continue Crestor 40 mg daily  Hypertension Hypertension managed with lisinopril 10 mg daily and metoprolol  succinate 50 mg daily. Experienced a syncopal episode likely due to vasovagal syncope, potentially exacerbated by over-treatment of blood pressure. Discussed the importance of not over-treating blood pressure to prevent further episodes. - Discontinue lisinopril - Taper metoprolol succinate by cutting the pill in half for three days, then stop - Monitor blood pressure and symptoms - Consider reintroducing a low dose of lisinopril (renal protection) if symptoms improve  Vasovagal Syncope Experienced a syncopal episode in May 2024 after donating blood, likely vasovagal in nature. Discussed the mechanism of vasovagal syncope and strategies to manage symptoms, including avoiding over-treatment of blood pressure and recognizing early warning signs. Advised to avoid sildenafil as it can exacerbate low blood pressure. - Educate on recognizing early symptoms of vasovagal syncope - Advise to sit or lie down and elevate legs when feeling symptoms - Avoid sildenafil  Hyperlipidemia Hyperlipidemia managed with Crestor 40 mg daily and fenofibrate 48 mg daily. Recent lab results show LDL 36 mg/dL and triglycerides 119 mg/dL, indicating good control. - Continue Crestor 40 mg daily - Continue fenofibrate 48 mg daily  Depression Depression treated with transcranial magnetic therapy and medication. Reports significant improvement after a week at [facility] and a change in medication. - Continue current depression management plan - Monitor mental health status  Follow-up - Follow up in six months with a nurse practitioner or PA - Annual follow-up with the cardiologist - Reach out if any new symptoms or episodes occur.               Signed, Donato Schultz, MD

## 2023-10-16 NOTE — Patient Instructions (Signed)
Medication Instructions:  Please discontinue Lisinopril. Decrease Metoprolol to 25 mg a day for 3 days then discontinue. Continue all other medications as listed.  *If you need a refill on your cardiac medications before your next appointment, please call your pharmacy*  Follow-Up: At [facility], you and your health needs are our priority.  As part of our continuing mission to provide you with exceptional heart care, we have created designated Provider Care Teams.  These Care Teams include your primary Cardiologist (physician) and Advanced Practice Providers (APPs -  Physician Assistants and Nurse Practitioners) who all work together to provide you with the care you need, when you need it.  We recommend signing up for the patient portal called "MyChart".  Sign up information is provided on this After Visit Summary.  MyChart is used to connect with patients for Virtual Visits (Telemedicine).  Patients are able to view lab/test results, encounter notes, upcoming appointments, etc.  Non-urgent messages can be sent to your provider as well.   To learn more about what you can do with MyChart, go to [email or web address].    Your next appointment:   6 month(s)  Provider:   Jari Favre, PA-C, Robin Searing, NP, Eligha Bridegroom, NP, Tereso Newcomer, PA-C, or Perlie Gold, PA-C     Then, Donato Schultz, MD will plan to see you again in 1 year(s).

## 2023-10-19 ENCOUNTER — Other Ambulatory Visit: Payer: Self-pay | Admitting: Cardiology

## 2023-10-31 ENCOUNTER — Ambulatory Visit: Payer: 59 | Admitting: Orthopaedic Surgery

## 2023-11-01 ENCOUNTER — Ambulatory Visit: Payer: 59 | Admitting: Orthopaedic Surgery

## 2023-11-01 DIAGNOSIS — Z9889 Other specified postprocedural states: Secondary | ICD-10-CM

## 2023-11-01 NOTE — Progress Notes (Signed)
Post-Op Visit Note   Patient: Jordan Hendricks           Date of Birth: [birth year]           MRN: [id number] Visit Date: 11/01/2023 PCP: Sharlene Dory, DO   Assessment & Plan:  Chief Complaint:  Chief Complaint  Patient presents with   Left Shoulder - Follow-up, Routine Post Op   Visit Diagnoses:  1. S/P left rotator cuff repair     Plan: Trevelle is 4 months status post left rotator cuff repair.  He is doing well overall.  He has about a month remaining of physical therapy.  He sleeping well at night.  Examination of the left shoulder shows normal active and passive range of motion with excellent strength.  Sawyer has done very well from the surgery.  Shoulder is functional.  He is not having any problems.  Finish out the remaining physical therapy sessions.  Will see him back as needed.  Follow-Up Instructions: No follow-ups on file.   Orders:  No orders of the defined types were placed in this encounter.  No orders of the defined types were placed in this encounter.   Imaging: No results found.  PMFS History: Patient Active Problem List   Diagnosis Date Noted   Mixed hyperlipidemia 08/30/2023   Gastroesophageal reflux disease 11/22/2022   Type 2 diabetes mellitus with hyperglycemia, without long-term current use of insulin (HCC) 06/01/2022   Major depressive disorder with single episode, in partial remission (HCC) 01/11/2021   Chest pain 10/07/2016   Stable angina (HCC) 10/07/2016   Angina decubitus (HCC) 09/30/2016   Family history of early CAD 09/30/2016   Pure hypercholesterolemia 09/30/2016   Essential hypertension 07/13/2016   Depression 07/13/2016   Past Medical History:  Diagnosis Date   Anginal pain (HCC)    Coronary artery disease    Depression    Essential hypertension 07/13/2016   Family history of adverse reaction to anesthesia    " My Dad had bad reactions ,He would wake up & have hallucinations"   GERD (gastroesophageal reflux  disease)    History of chicken pox    History of colon polyps    Hyperlipidemia     Family History  Problem Relation Age of Onset   Heart disease Father    Prostate cancer Father    Alzheimer's disease Father    Diabetes Father    Hypertension Father    Diabetes Maternal Grandmother    Alzheimer's disease Paternal Grandmother    Breast cancer Neg Hx     Past Surgical History:  Procedure Laterality Date   CARDIAC CATHETERIZATION  10/07/2016   CARDIAC CATHETERIZATION N/A 10/07/2016   Procedure: Left Heart Cath and Coronary Angiography;  Surgeon: Yvonne Kendall, MD;  Location: [facility] INVASIVE CV LAB;  Service: Cardiovascular;  Laterality: N/A;   CARDIAC CATHETERIZATION N/A 10/07/2016   Procedure: Coronary Stent Intervention;  Surgeon: Yvonne Kendall, MD;  Location: [facility] INVASIVE CV LAB;  Service: Cardiovascular;  Laterality: N/A;  RCA   PERCUTANEOUS CORONARY STENT INTERVENTION (PCI-S)  10/07/2016   RCA   REFRACTIVE SURGERY Bilateral    ROTATOR CUFF REPAIR Right 2015   ROTATOR CUFF REPAIR Left 2024   WISDOM TOOTH EXTRACTION     Social History   Occupational History   Not on file  Tobacco Use   Smoking status: Never   Smokeless tobacco: Never  Vaping Use   Vaping status: Never Used  Substance and Sexual Activity   Alcohol use: Yes  Comment: social   Drug use: No   Sexual activity: Not on file

## 2023-11-05 ENCOUNTER — Other Ambulatory Visit: Payer: Self-pay | Admitting: Family Medicine

## 2023-11-05 DIAGNOSIS — K219 Gastro-esophageal reflux disease without esophagitis: Secondary | ICD-10-CM

## 2023-11-28 ENCOUNTER — Other Ambulatory Visit: Payer: Self-pay | Admitting: Family Medicine

## 2023-11-28 ENCOUNTER — Encounter: Payer: Self-pay | Admitting: Family Medicine

## 2023-11-28 ENCOUNTER — Ambulatory Visit: Payer: 59 | Admitting: Family Medicine

## 2023-11-28 VITALS — BP 132/87 | HR 89 | Temp 97.9°F | Resp 16 | Ht 67.0 in | Wt 194.0 lb

## 2023-11-28 DIAGNOSIS — E1165 Type 2 diabetes mellitus with hyperglycemia: Secondary | ICD-10-CM

## 2023-11-28 DIAGNOSIS — Z7984 Long term (current) use of oral hypoglycemic drugs: Secondary | ICD-10-CM

## 2023-11-28 DIAGNOSIS — E782 Mixed hyperlipidemia: Secondary | ICD-10-CM

## 2023-11-28 LAB — COMPREHENSIVE METABOLIC PANEL
ALT: 49 U/L (ref 0–53)
AST: 36 U/L (ref 0–37)
Albumin: 4.4 g/dL (ref 3.5–5.2)
Alkaline Phosphatase: 83 U/L (ref 39–117)
BUN: 12 mg/dL (ref 6–23)
CO2: 29 meq/L (ref 19–32)
Calcium: 9.6 mg/dL (ref 8.4–10.5)
Chloride: 104 meq/L (ref 96–112)
Creatinine, Ser: 1.11 mg/dL (ref 0.40–1.50)
GFR: 72.11 mL/min (ref >60.00)
Glucose, Bld: 177 mg/dL — ABNORMAL HIGH (ref 70–99)
Potassium: 4.4 meq/L (ref 3.5–5.1)
Sodium: 140 meq/L (ref 135–145)
Total Bilirubin: 0.6 mg/dL (ref 0.2–1.2)
Total Protein: 6.5 g/dL (ref 6.0–8.3)

## 2023-11-28 LAB — MICROALBUMIN / CREATININE URINE RATIO
Creatinine,U: 172.1 mg/dL
Microalb Creat Ratio: 8.9 mg/g (ref 0.0–30.0)
Microalb, Ur: 1.5 mg/dL (ref 0.0–1.9)

## 2023-11-28 LAB — LIPID PANEL
Cholesterol: 101 mg/dL (ref 0–200)
HDL: 32.8 mg/dL — ABNORMAL LOW (ref >39.00)
LDL Cholesterol: 35 mg/dL (ref 0–99)
NonHDL: 67.85
Total CHOL/HDL Ratio: 3
Triglycerides: 165 mg/dL — ABNORMAL HIGH (ref 0.0–149.0)
VLDL: 33 mg/dL (ref 0.0–40.0)

## 2023-11-28 LAB — HEMOGLOBIN A1C: Hgb A1c MFr Bld: 7.2 % — ABNORMAL HIGH (ref 4.6–6.5)

## 2023-11-28 MED ORDER — METFORMIN HCL ER 500 MG PO TB24: 1000.0000 mg | ORAL_TABLET | Freq: Two times a day (BID) | ORAL | 3 refills | Status: DC

## 2023-11-28 NOTE — Patient Instructions (Signed)
Give us 2-3 business days to get the results of your labs back.   Keep the diet clean and stay active.  Aim to do some physical exertion for 150 minutes per week. This is typically divided into 5 days per week, 30 minutes per day. The activity should be enough to get your heart rate up. Anything is better than nothing if you have time constraints.  Let us know if you need anything.  

## 2023-11-28 NOTE — Progress Notes (Signed)
 Subjective:   Chief Complaint  Patient presents with   Diabetes    Here for follow up   Hypertension    Here for follow up   Hand Pain    Complains of pain on right thumb, "trigger finger"    Jordan Hendricks is a 63 y.o. male here for follow-up of diabetes.   Din does not routinely check his sugars.  Patient does not require insulin.   Medications include: metformin XR 1000 mg/d Diet is OK.  Exercise: some PT/rehab exercises  Hyperlipidemia Patient presents for mixed hyperlipidemia follow up. Currently being treated with Crestor 40 mg/d, Tricor 48 mg/d and compliance with treatment thus far has been good. He denies myalgias. Diet/exercise as above. No CP or SOB.  The patient is known to have coexisting coronary artery disease.  Past Medical History:  Diagnosis Date   Anginal pain (HCC)    Coronary artery disease    Depression    Essential hypertension 07/13/2016   Family history of adverse reaction to anesthesia    " My Dad had bad reactions ,He would wake up & have hallucinations"   GERD (gastroesophageal reflux disease)    History of chicken pox    History of colon polyps    Hyperlipidemia      Related testing: Retinal exam: Done Pneumovax: done  Objective:  BP 132/87 (BP Location: Right Arm, Patient Position: Sitting)   Pulse 89   Temp 97.9 F (36.6 C) (Oral)   Resp 16   Ht 5\' 7"  (1.702 m)   Wt 194 lb (88 kg)   SpO2 97%   BMI 30.38 kg/m  General:  Well developed, well nourished, in no apparent distress Lungs:  CTAB, no access msc use Cardio:  RRR, no bruits, no LE edema Psych: Age appropriate judgment and insight  Assessment:   Type 2 diabetes mellitus with hyperglycemia, without long-term current use of insulin (HCC) - Plan: Microalbumin / creatinine urine ratio, Hemoglobin A1c, Comprehensive metabolic panel, Lipid panel  Mixed hyperlipidemia   Plan:   Chronic, hopefully stable.  Continue metformin XR 1000 mg daily.  If not controlled, will  increase to twice daily.  We also discussed SGLT2 inhibitors and GLP-1 weekly agonists.  As long as he is not significantly worse with his A1c, he would prefer to avoid these.  Counseled on diet and exercise. Chronic, hopefully stable.  Continue Crestor 40 mg daily, Tricor 48 mg daily.  If worsening, would increase Tricor dosage.  If improved, would not change anything. F/u in 3-6 mo. The patient voiced understanding and agreement to the plan.  Jilda Roche [place], DO 11/28/23 11:58 AM

## 2024-02-27 ENCOUNTER — Encounter: Payer: Self-pay | Admitting: Family Medicine

## 2024-02-27 ENCOUNTER — Ambulatory Visit: Admitting: Family Medicine

## 2024-02-27 VITALS — BP 110/78 | HR 96 | Temp 98.0°F | Resp 16 | Ht 67.0 in | Wt 195.6 lb

## 2024-02-27 DIAGNOSIS — E1165 Type 2 diabetes mellitus with hyperglycemia: Secondary | ICD-10-CM | POA: Diagnosis not present

## 2024-02-27 DIAGNOSIS — E782 Mixed hyperlipidemia: Secondary | ICD-10-CM

## 2024-02-27 DIAGNOSIS — Z7984 Long term (current) use of oral hypoglycemic drugs: Secondary | ICD-10-CM | POA: Diagnosis not present

## 2024-02-27 NOTE — Progress Notes (Signed)
 Subjective:   Chief Complaint  Patient presents with   Follow-up    Follow Up    Jordan Hendricks is a 63 y.o. male here for follow-up of diabetes.   Aaliyah does not routinely check his sugars.  Patient does not require insulin.   Medications include: Metformin  XR 1000 mg in the AM and 500 mg in the PM He sometimes has trouble remembering taking the second dosage of metformin . Diet is fair.  Exercise: lifting wts, walking  Hyperlipidemia Patient presents for mixed hyperlipidemia follow up. Currently being treated with Crestor  40 mg daily, fenofibrate  48 mg daily and compliance with treatment thus far has been good. He denies myalgias. Diet/exercise as above. No chest pain or shortness of breath. The patient is known to have coexisting coronary artery disease.  Past Medical History:  Diagnosis Date   Anginal pain (HCC)    Coronary artery disease    Depression    Essential hypertension 07/13/2016   Family history of adverse reaction to anesthesia    " My Dad had bad reactions ,He would wake up & have hallucinations"   GERD (gastroesophageal reflux disease)    History of chicken pox    History of colon polyps    Hyperlipidemia      Related testing: Retinal exam: Done Pneumovax: done  Objective:  BP 110/78 (BP Location: Left Arm, Patient Position: Sitting)   Pulse 96   Temp 98 F (36.7 C) (Oral)   Resp 16   Ht 5\' 7"  (1.702 m)   Wt 195 lb 9.6 oz (88.7 kg)   SpO2 97%   BMI 30.64 kg/m  General:  Well developed, well nourished, in no apparent distress Lungs:  CTAB, no access msc use Cardio:  RRR, no bruits, no LE edema Psych: Age appropriate judgment and insight  Assessment:   Type 2 diabetes mellitus with hyperglycemia, without long-term current use of insulin (HCC) - Plan: Comprehensive metabolic panel with GFR, Hemoglobin A1c, Lipid panel  Mixed hyperlipidemia   Plan:   Chronic, most recently not controlled.  Go back to metformin  1000 mg once daily for  now.  If still elevated would consider oral medication SGLT2 inhibitor.  Counseled on diet and exercise. Chronic, hopefully stable.  Continue Crestor  40 mg daily, fenofibrate  48 mg daily.  Will increase the fenofibrate  if the triglycerides are still elevated. F/u in 3-6 mo pending his results. The patient voiced understanding and agreement to the plan.  Shellie Dials [place], DO 02/27/24 8:56 AM

## 2024-02-27 NOTE — Patient Instructions (Signed)
 Give us  2-3 business days to get the results of your labs back.   Keep the diet clean and stay active.  Consider biotene mouthwash.   Let us  know if you need anything.

## 2024-02-28 ENCOUNTER — Other Ambulatory Visit: Payer: Self-pay

## 2024-02-28 ENCOUNTER — Ambulatory Visit: Payer: Self-pay | Admitting: Family Medicine

## 2024-02-28 LAB — COMPREHENSIVE METABOLIC PANEL WITH GFR
AG Ratio: 2.3 (calc) (ref 1.0–2.5)
ALT: 44 U/L (ref 9–46)
AST: 28 U/L (ref 10–35)
Albumin: 4.6 g/dL (ref 3.6–5.1)
Alkaline phosphatase (APISO): 83 U/L (ref 35–144)
BUN: 13 mg/dL (ref 7–25)
CO2: 28 mmol/L (ref 20–32)
Calcium: 9.2 mg/dL (ref 8.6–10.3)
Chloride: 104 mmol/L (ref 98–110)
Creat: 1.06 mg/dL (ref 0.70–1.35)
Globulin: 2 g/dL (ref 1.9–3.7)
Glucose, Bld: 178 mg/dL — ABNORMAL HIGH (ref 65–99)
Potassium: 4.3 mmol/L (ref 3.5–5.3)
Sodium: 139 mmol/L (ref 135–146)
Total Bilirubin: 0.5 mg/dL (ref 0.2–1.2)
Total Protein: 6.6 g/dL (ref 6.1–8.1)
eGFR: 80 mL/min/{1.73_m2} (ref >=60)

## 2024-02-28 LAB — LIPID PANEL
Cholesterol: 115 mg/dL (ref <200)
HDL: 38 mg/dL — ABNORMAL LOW (ref >=40)
LDL Cholesterol (Calc): 48 mg/dL
Non-HDL Cholesterol (Calc): 77 mg/dL (ref <130)
Total CHOL/HDL Ratio: 3 (calc) (ref <5.0)
Triglycerides: 231 mg/dL — ABNORMAL HIGH (ref <150)

## 2024-02-28 LAB — HEMOGLOBIN A1C
Hgb A1c MFr Bld: 8.2 % — ABNORMAL HIGH (ref <5.7)
Mean Plasma Glucose: 189 mg/dL
eAG (mmol/L): 10.4 mmol/L

## 2024-02-28 MED ORDER — FENOFIBRATE 145 MG PO TABS: 145.0000 mg | ORAL_TABLET | Freq: Every day | ORAL | 3 refills | Status: DC

## 2024-02-28 MED ORDER — EMPAGLIFLOZIN 25 MG PO TABS: 25.0000 mg | ORAL_TABLET | Freq: Every day | ORAL | 3 refills | Status: DC

## 2024-03-19 ENCOUNTER — Other Ambulatory Visit: Payer: Self-pay | Admitting: Family Medicine

## 2024-03-19 MED ORDER — SILDENAFIL CITRATE 100 MG PO TABS: 50.0000 mg | ORAL_TABLET | Freq: Every day | ORAL | 2 refills | Status: DC | PRN

## 2024-03-19 NOTE — Telephone Encounter (Signed)
 Copied from CRM [id number]. Topic: Clinical - Medication Refill >> Mar 19, 2024  2:02 PM Drema MATSU wrote: Medication: sildenafil  (VIAGRA ) 100 MG tablet   Has the patient contacted their pharmacy? No (Agent: If no, request that the patient contact the pharmacy for the refill. If patient does not wish to contact the pharmacy document the reason why and proceed with request.) (Agent: If yes, when and what did the pharmacy advise?) no refills   This is the patient's preferred pharmacy:  [id number] - [place], [place] - [address] [facility] [address] [place] [address] Phone: [phone] Fax: [phone]  Is this the correct pharmacy for this prescription? Yes If no, delete pharmacy and type the correct one.   Has the prescription been filled recently? Yes  Is the patient out of the medication? Yes states that it has expired   Has the patient been seen for an appointment in the last year OR does the patient have an upcoming appointment? Yes  Can we respond through MyChart? Yes  Agent: Please be advised that Rx refills may take up to 3 business days. We ask that you follow-up with your pharmacy.

## 2024-04-21 ENCOUNTER — Encounter: Payer: Self-pay | Admitting: Family Medicine

## 2024-04-22 ENCOUNTER — Other Ambulatory Visit: Payer: Self-pay

## 2024-04-22 MED ORDER — SILDENAFIL CITRATE 100 MG PO TABS: 50.0000 mg | ORAL_TABLET | Freq: Every day | ORAL | 2 refills | Status: AC | PRN

## 2024-04-23 ENCOUNTER — Other Ambulatory Visit: Payer: Self-pay | Admitting: Family Medicine

## 2024-05-08 ENCOUNTER — Other Ambulatory Visit: Payer: Self-pay | Admitting: Family Medicine

## 2024-05-21 ENCOUNTER — Encounter: Payer: Self-pay | Admitting: Family Medicine

## 2024-05-21 ENCOUNTER — Ambulatory Visit: Payer: Self-pay | Admitting: Family Medicine

## 2024-05-21 ENCOUNTER — Ambulatory Visit: Admitting: Family Medicine

## 2024-05-21 VITALS — BP 130/82 | HR 86 | Temp 98.0°F | Resp 16 | Ht 67.0 in | Wt 186.2 lb

## 2024-05-21 DIAGNOSIS — Z125 Encounter for screening for malignant neoplasm of prostate: Secondary | ICD-10-CM

## 2024-05-21 DIAGNOSIS — Z0000 Encounter for general adult medical examination without abnormal findings: Secondary | ICD-10-CM | POA: Diagnosis not present

## 2024-05-21 DIAGNOSIS — Z13220 Encounter for screening for lipoid disorders: Secondary | ICD-10-CM

## 2024-05-21 DIAGNOSIS — E1165 Type 2 diabetes mellitus with hyperglycemia: Secondary | ICD-10-CM

## 2024-05-21 LAB — COMPREHENSIVE METABOLIC PANEL WITH GFR
ALT: 48 U/L (ref 0–53)
AST: 32 U/L (ref 0–37)
Albumin: 4.4 g/dL (ref 3.5–5.2)
Alkaline Phosphatase: 73 U/L (ref 39–117)
BUN: 14 mg/dL (ref 6–23)
CO2: 26 meq/L (ref 19–32)
Calcium: 9.2 mg/dL (ref 8.4–10.5)
Chloride: 106 meq/L (ref 96–112)
Creatinine, Ser: 1.24 mg/dL (ref 0.40–1.50)
GFR: 62.92 mL/min (ref >60.00)
Glucose, Bld: 121 mg/dL — ABNORMAL HIGH (ref 70–99)
Potassium: 4.4 meq/L (ref 3.5–5.1)
Sodium: 141 meq/L (ref 135–145)
Total Bilirubin: 0.4 mg/dL (ref 0.2–1.2)
Total Protein: 6.5 g/dL (ref 6.0–8.3)

## 2024-05-21 LAB — CBC
HCT: 44.8 % (ref 39.0–52.0)
Hemoglobin: 15.1 g/dL (ref 13.0–17.0)
MCHC: 33.7 g/dL (ref 30.0–36.0)
MCV: 84.6 fl (ref 78.0–100.0)
Platelets: 289 K/uL (ref 150.0–400.0)
RBC: 5.3 Mil/uL (ref 4.22–5.81)
RDW: 12.9 % (ref 11.5–15.5)
WBC: 5.7 K/uL (ref 4.0–10.5)

## 2024-05-21 LAB — LIPID PANEL
Cholesterol: 104 mg/dL (ref 0–200)
HDL: 32 mg/dL — ABNORMAL LOW (ref >39.00)
LDL Cholesterol: 36 mg/dL (ref 0–99)
NonHDL: 71.82
Total CHOL/HDL Ratio: 3
Triglycerides: 178 mg/dL — ABNORMAL HIGH (ref 0.0–149.0)
VLDL: 35.6 mg/dL (ref 0.0–40.0)

## 2024-05-21 LAB — PSA: PSA: 2.35 ng/mL (ref 0.10–4.00)

## 2024-05-21 LAB — HEMOGLOBIN A1C: Hgb A1c MFr Bld: 7.5 % — ABNORMAL HIGH (ref 4.6–6.5)

## 2024-05-21 NOTE — Progress Notes (Signed)
 Chief Complaint  Patient presents with   Annual Exam    CPE    Well Male Jordan Hendricks is here for a complete physical.   His last physical was >1 year ago.  Current diet: in general, a healthy diet.  Current exercise: was lifting wts, walking Weight trend: intentionally losing Fatigue out of ordinary? No. Seat belt? Yes.   Advanced directive? Yes  Health maintenance Shingrix - Yes CCS Yes Tetanus- Yes HIV- Yes Hep C- Yes   Past Medical History:  Diagnosis Date   Anginal pain (HCC)    Coronary artery disease    Depression    Essential hypertension 07/13/2016   Family history of adverse reaction to anesthesia     My Dad had bad reactions ,He would wake up & have hallucinations   GERD (gastroesophageal reflux disease)    History of chicken pox    History of colon polyps    Hyperlipidemia       Past Surgical History:  Procedure Laterality Date   CARDIAC CATHETERIZATION  10/07/2016   CARDIAC CATHETERIZATION N/A 10/07/2016   Procedure: Left Heart Cath and Coronary Angiography;  Surgeon: Lonni Hanson, MD;  Location: [facility] INVASIVE CV LAB;  Service: Cardiovascular;  Laterality: N/A;   CARDIAC CATHETERIZATION N/A 10/07/2016   Procedure: Coronary Stent Intervention;  Surgeon: Lonni Hanson, MD;  Location: [facility] INVASIVE CV LAB;  Service: Cardiovascular;  Laterality: N/A;  RCA   PERCUTANEOUS CORONARY STENT INTERVENTION (PCI-S)  10/07/2016   RCA   REFRACTIVE SURGERY Bilateral    ROTATOR CUFF REPAIR Right 2015   ROTATOR CUFF REPAIR Left 2024   WISDOM TOOTH EXTRACTION      Medications  Current Outpatient Medications on File Prior to Visit  Medication Sig Dispense Refill   aspirin  EC 81 MG tablet Take 1 tablet (81 mg total) by mouth daily. 30 tablet 11   busPIRone (BUSPAR) 30 MG tablet Take 30 mg by mouth 2 (two) times daily.     empagliflozin  (JARDIANCE ) 25 MG TABS tablet Take 1 tablet (25 mg total) by mouth daily. 30 tablet 3   fenofibrate  (TRICOR ) 145 MG tablet  TAKE 1 TABLET BY MOUTH DAILY 90 tablet 3   glucosamine-chondroitin 500-400 MG tablet Take 1 tablet by mouth 2 (two) times daily.     Melatonin 5 MG CHEW Chew 1 capsule by mouth at bedtime.     metFORMIN  (GLUCOPHAGE -XR) 500 MG 24 hr tablet Take 2 tablets (1,000 mg total) by mouth 2 (two) times daily with a meal. 360 tablet 3   Multiple Vitamins-Minerals (MULTIVITAMIN ADULT PO) Take 1 tablet by mouth daily.     nitroGLYCERIN  (NITROSTAT ) 0.4 MG SL tablet Place 1 tablet (0.4 mg total) under the tongue every 5 (five) minutes as needed for chest pain. 25 tablet 3   nortriptyline (PAMELOR) 25 MG capsule Take 1 capsule by mouth at bedtime.     nortriptyline (PAMELOR) 50 MG capsule Take 2 capsules by mouth at bedtime.     pantoprazole  (PROTONIX ) 40 MG tablet TAKE 1 TABLET BY MOUTH DAILY 90 tablet 3   rosuvastatin  (CRESTOR ) 40 MG tablet TAKE 1 TABLET BY MOUTH DAILY 90 tablet 3   sildenafil  (VIAGRA ) 100 MG tablet Take 0.5-1 tablets (50-100 mg total) by mouth daily as needed for erectile dysfunction. 30 tablet 2   traZODone (DESYREL) 50 MG tablet Take 50 mg by mouth at bedtime.     VITAMIN D , CHOLECALCIFEROL, PO Take by mouth daily at 6 (six) AM.      Allergies  Allergies  Allergen Reactions   Lactose Intolerance (Gi)    Tape Rash    Plastic tape    Family History Family History  Problem Relation Age of Onset   Heart disease Father    Prostate cancer Father    Alzheimer's disease Father    Diabetes Father    Hypertension Father    Diabetes Maternal Grandmother    Alzheimer's disease Paternal Grandmother    Breast cancer Neg Hx     Review of Systems: Constitutional:  no fevers Eye:  no recent significant change in vision Ear/Nose/Mouth/Throat:  Ears:  no hearing loss Nose/Mouth/Throat:  no complaints of nasal congestion, no sore throat Cardiovascular:  no chest pain Respiratory:  no shortness of breath Gastrointestinal:  no change in bowel habits GU:  Male: negative for dysuria,  frequency Musculoskeletal/Extremities:  no joint pain Integumentary (Skin/Breast):  no abnormal skin lesions reported Neurologic:  no headaches Endocrine: No unexpected weight changes Hematologic/Lymphatic:  no abnormal bleeding  Exam BP 130/82 (BP Location: Left Arm, Patient Position: Sitting)   Pulse 86   Temp 98 F (36.7 C) (Oral)   Resp 16   Ht 5' 7 (1.702 m)   Wt 186 lb 3.2 oz (84.5 kg)   SpO2 97%   BMI 29.16 kg/m  General:  well developed, well nourished, in no apparent distress Skin:  no significant moles, warts, or growths Head:  no masses, lesions, or tenderness Eyes:  pupils equal and round, sclera anicteric without injection Ears:  canals without lesions, TMs shiny without retraction, no obvious effusion, no erythema Nose:  nares patent, mucosa normal Throat/Pharynx:  lips and gingiva without lesion; tongue and uvula midline; non-inflamed pharynx; no exudates or postnasal drainage Neck: neck supple without adenopathy, thyromegaly, or masses Cardiac: RRR, no bruits, no LE edema Lungs:  clear to auscultation, breath sounds equal bilaterally, no respiratory distress Abdomen: BS+, soft, non-tender, non-distended, no masses or organomegaly noted Rectal: Deferred Musculoskeletal:  symmetrical muscle groups noted without atrophy or deformity Neuro:  gait normal; deep tendon reflexes normal and symmetric Psych: well oriented with normal range of affect and appropriate judgment/insight  Assessment and Plan  Well adult exam - Plan: CBC, Comprehensive metabolic panel with GFR, Lipid panel  Type 2 diabetes mellitus with hyperglycemia, without long-term current use of insulin (HCC) - Plan: Hemoglobin A1c  Screening for prostate cancer - Plan: PSA   Well 63 y.o. male. Counseled on diet and exercise. Counseled on risks and benefits of prostate cancer screening with PSA. The patient agrees to undergo testing. Immunizations, labs, and further orders as above. Follow up in  3-6. The patient voiced understanding and agreement to the plan.  Jordan [place], DO 05/21/24 9:08 AM

## 2024-05-21 NOTE — Patient Instructions (Signed)
Give us 2-3 business days to get the results of your labs back.   Keep the diet clean and stay active.  I recommend getting the flu shot in mid October. This suggestion would change if the CDC comes out with a different recommendation.   Let us know if you need anything. 

## 2024-06-18 ENCOUNTER — Other Ambulatory Visit: Payer: Self-pay | Admitting: Family Medicine

## 2024-07-22 ENCOUNTER — Encounter: Payer: Self-pay | Admitting: Radiology

## 2024-08-11 ENCOUNTER — Other Ambulatory Visit: Payer: Self-pay | Admitting: Family Medicine

## 2024-08-25 ENCOUNTER — Encounter: Payer: Self-pay | Admitting: Family Medicine

## 2024-08-26 ENCOUNTER — Other Ambulatory Visit: Payer: Self-pay

## 2024-08-26 MED ORDER — METFORMIN HCL ER 500 MG PO TB24: 1000.0000 mg | ORAL_TABLET | Freq: Every day | ORAL | 1 refills | Status: DC

## 2024-08-27 ENCOUNTER — Other Ambulatory Visit: Payer: Self-pay

## 2024-08-27 NOTE — Telephone Encounter (Signed)
 Called pt was advised he will contact his insurance, Pt said his medication on the way from  [facility]  but wants  change  to [place]. Pt advised that he will need to let them know his changing to [facility].

## 2024-08-28 ENCOUNTER — Other Ambulatory Visit: Payer: Self-pay | Admitting: Family Medicine

## 2024-08-28 MED ORDER — METFORMIN HCL ER 500 MG PO TB24: 1000.0000 mg | ORAL_TABLET | Freq: Two times a day (BID) | ORAL | 0 refills | Status: DC

## 2024-08-30 ENCOUNTER — Encounter: Payer: Self-pay | Admitting: Cardiology

## 2024-09-14 ENCOUNTER — Other Ambulatory Visit: Payer: Self-pay | Admitting: Family Medicine

## 2024-09-16 ENCOUNTER — Ambulatory Visit: Admitting: Family Medicine

## 2024-09-16 ENCOUNTER — Encounter: Payer: Self-pay | Admitting: Family Medicine

## 2024-09-16 ENCOUNTER — Other Ambulatory Visit

## 2024-09-16 ENCOUNTER — Ambulatory Visit: Payer: Self-pay | Admitting: Family Medicine

## 2024-09-16 VITALS — BP 128/82 | HR 93 | Temp 98.0°F | Resp 16 | Ht 67.0 in | Wt 181.4 lb

## 2024-09-16 DIAGNOSIS — E782 Mixed hyperlipidemia: Secondary | ICD-10-CM | POA: Diagnosis not present

## 2024-09-16 DIAGNOSIS — E1165 Type 2 diabetes mellitus with hyperglycemia: Secondary | ICD-10-CM

## 2024-09-16 DIAGNOSIS — Z7984 Long term (current) use of oral hypoglycemic drugs: Secondary | ICD-10-CM

## 2024-09-16 LAB — COMPREHENSIVE METABOLIC PANEL WITH GFR
ALT: 20 U/L (ref 3–53)
AST: 14 U/L (ref 5–37)
Albumin: 4.4 g/dL (ref 3.5–5.2)
Alkaline Phosphatase: 62 U/L (ref 39–117)
BUN: 23 mg/dL (ref 6–23)
CO2: 28 meq/L (ref 19–32)
Calcium: 9.5 mg/dL (ref 8.4–10.5)
Chloride: 101 meq/L (ref 96–112)
Creatinine, Ser: 1.15 mg/dL (ref 0.40–1.50)
GFR: 68.72 mL/min
Glucose, Bld: 95 mg/dL (ref 70–99)
Potassium: 4.2 meq/L (ref 3.5–5.1)
Sodium: 141 meq/L (ref 135–145)
Total Bilirubin: 0.5 mg/dL (ref 0.2–1.2)
Total Protein: 6.9 g/dL (ref 6.0–8.3)

## 2024-09-16 LAB — HEMOGLOBIN A1C: Hgb A1c MFr Bld: 7.1 % — ABNORMAL HIGH (ref 4.6–6.5)

## 2024-09-16 LAB — LIPID PANEL
Cholesterol: 117 mg/dL (ref 28–200)
HDL: 41 mg/dL
LDL Cholesterol: 41 mg/dL (ref 10–99)
NonHDL: 75.79
Total CHOL/HDL Ratio: 3
Triglycerides: 176 mg/dL — ABNORMAL HIGH (ref 10.0–149.0)
VLDL: 35.2 mg/dL (ref 0.0–40.0)

## 2024-09-16 NOTE — Progress Notes (Signed)
 Subjective:   Chief Complaint  Patient presents with   Follow-up    Follow Up    Jordan Hendricks is a 63 y.o. male here for follow-up of diabetes.   Jordan Hendricks does not routinely ck his sugars.  Patient does not require insulin.   Medications include: metformin  XR 1000 mg bid, Jardiance  25 mg/d Diet is OK.  Exercise: lifting wts, walking  Hyperlipidemia Patient presents for mixed hyperlipidemia follow up. Currently being treated with Crestor  40 mg/d, Tricor  145 mg/d and compliance with treatment thus far has been good. He denies myalgias. Diet/exercise as above. No CP or SOB.  The patient is not known to have coexisting coronary artery disease.  Past Medical History:  Diagnosis Date   Anginal pain    Coronary artery disease    Depression    Essential hypertension 07/13/2016   Family history of adverse reaction to anesthesia     My Dad had bad reactions ,He would wake up & have hallucinations   GERD (gastroesophageal reflux disease)    History of chicken pox    History of colon polyps    Hyperlipidemia      Related testing: Retinal exam: Done Pneumovax: done  Objective:  BP 128/82 (BP Location: Left Arm, Patient Position: Sitting)   Pulse 93   Temp 98 F (36.7 C) (Oral)   Resp 16   Ht 5' 7 (1.702 m)   Wt 181 lb 6.4 oz (82.3 kg)   SpO2 96%   BMI 28.41 kg/m  General:  Well developed, well nourished, in no apparent distress Skin:  Warm, no pallor or diaphoresis Head:  Normocephalic, atraumatic Eyes:  Pupils equal and round, sclera anicteric without injection  Lungs:  CTAB, no access msc use Cardio:  RRR, no bruits, no LE edema Neuro: sensation intact to pinprick b/l Psych: Age appropriate judgment and insight  Assessment:   Type 2 diabetes mellitus with hyperglycemia, without long-term current use of insulin (HCC) - Plan: Comprehensive metabolic panel with GFR, Lipid panel, Hemoglobin A1c  Mixed hyperlipidemia   Plan:   Chronic, not fully stable. Cont  Meformin XR 1000 mg bid, Jardiance  25 mg/d. Counseled on diet and exercise. Chronic, unsure stable.  Continue Crestor  40 mg daily, Tricor  145 mg daily. F/u in 3-6 mo pending results. The patient voiced understanding and agreement to the plan.  Mabel Mt [place], DO 09/16/2024 10:47 AM

## 2024-09-16 NOTE — Patient Instructions (Signed)
 Give us  2-3 business days to get the results of your labs back.   Keep the diet clean and stay active.  Let us  know if you need anything.

## 2024-09-22 ENCOUNTER — Other Ambulatory Visit: Payer: Self-pay | Admitting: Family Medicine

## 2024-09-30 ENCOUNTER — Other Ambulatory Visit: Payer: Self-pay | Admitting: Family Medicine

## 2024-09-30 DIAGNOSIS — K219 Gastro-esophageal reflux disease without esophagitis: Secondary | ICD-10-CM

## 2024-11-12 ENCOUNTER — Ambulatory Visit (HOSPITAL_BASED_OUTPATIENT_CLINIC_OR_DEPARTMENT_OTHER): Admitting: Cardiology

## 2025-03-10 ENCOUNTER — Ambulatory Visit: Admitting: Family Medicine
